# Patient Record
Sex: Female | Born: 1980 | Race: White | Hispanic: No | Marital: Single | Smoking: Former smoker
Health system: Southern US, Community
[De-identification: ages and names within clinical notes are randomized; demographics above are authoritative.]

## PROBLEM LIST (undated history)

## (undated) ENCOUNTER — Inpatient Hospital Stay (HOSPITAL_COMMUNITY): Payer: Self-pay

## (undated) DIAGNOSIS — F32A Depression, unspecified: Secondary | ICD-10-CM

## (undated) DIAGNOSIS — F419 Anxiety disorder, unspecified: Secondary | ICD-10-CM

## (undated) DIAGNOSIS — R7303 Prediabetes: Secondary | ICD-10-CM

## (undated) DIAGNOSIS — Z789 Other specified health status: Secondary | ICD-10-CM

## (undated) HISTORY — PX: WISDOM TOOTH EXTRACTION: SHX21

---

## 2004-05-14 ENCOUNTER — Emergency Department (HOSPITAL_COMMUNITY): Admission: EM | Admit: 2004-05-14 | Discharge: 2004-05-14 | Payer: Self-pay | Admitting: Emergency Medicine

## 2005-12-19 ENCOUNTER — Ambulatory Visit (HOSPITAL_COMMUNITY): Admission: RE | Admit: 2005-12-19 | Discharge: 2005-12-19 | Payer: Self-pay | Admitting: Gynecology

## 2006-01-02 ENCOUNTER — Ambulatory Visit (HOSPITAL_COMMUNITY): Admission: RE | Admit: 2006-01-02 | Discharge: 2006-01-02 | Payer: Self-pay | Admitting: Obstetrics & Gynecology

## 2006-01-24 ENCOUNTER — Ambulatory Visit: Payer: Self-pay | Admitting: Obstetrics & Gynecology

## 2006-02-20 ENCOUNTER — Inpatient Hospital Stay (HOSPITAL_COMMUNITY): Admission: AD | Admit: 2006-02-20 | Discharge: 2006-02-20 | Payer: Self-pay | Admitting: *Deleted

## 2006-02-20 ENCOUNTER — Ambulatory Visit: Payer: Self-pay | Admitting: *Deleted

## 2006-05-18 ENCOUNTER — Inpatient Hospital Stay (HOSPITAL_COMMUNITY): Admission: AD | Admit: 2006-05-18 | Discharge: 2006-05-20 | Payer: Self-pay | Admitting: Obstetrics & Gynecology

## 2006-05-18 ENCOUNTER — Inpatient Hospital Stay (HOSPITAL_COMMUNITY): Admission: AD | Admit: 2006-05-18 | Discharge: 2006-05-18 | Payer: Self-pay | Admitting: Obstetrics & Gynecology

## 2006-05-18 ENCOUNTER — Ambulatory Visit: Payer: Self-pay | Admitting: *Deleted

## 2006-05-24 ENCOUNTER — Inpatient Hospital Stay (HOSPITAL_COMMUNITY): Admission: AD | Admit: 2006-05-24 | Discharge: 2006-05-24 | Payer: Self-pay | Admitting: Obstetrics & Gynecology

## 2006-08-30 ENCOUNTER — Emergency Department (HOSPITAL_COMMUNITY): Admission: EM | Admit: 2006-08-30 | Discharge: 2006-08-30 | Payer: Self-pay | Admitting: Emergency Medicine

## 2008-08-30 ENCOUNTER — Emergency Department (HOSPITAL_COMMUNITY): Admission: EM | Admit: 2008-08-30 | Discharge: 2008-08-30 | Payer: Self-pay | Admitting: Emergency Medicine

## 2010-04-07 ENCOUNTER — Emergency Department (HOSPITAL_COMMUNITY)
Admission: EM | Admit: 2010-04-07 | Discharge: 2010-04-07 | Payer: Self-pay | Source: Home / Self Care | Admitting: Emergency Medicine

## 2010-04-07 LAB — URINALYSIS, ROUTINE W REFLEX MICROSCOPIC
Bilirubin Urine: NEGATIVE
Hemoglobin, Urine: NEGATIVE
Ketones, ur: NEGATIVE mg/dL
Nitrite: NEGATIVE
Protein, ur: NEGATIVE mg/dL
Specific Gravity, Urine: 1.022 (ref 1.005–1.030)
Urine Glucose, Fasting: NEGATIVE mg/dL
Urobilinogen, UA: 0.2 mg/dL (ref 0.0–1.0)
pH: 7 (ref 5.0–8.0)

## 2010-04-07 LAB — WET PREP, GENITAL
Trich, Wet Prep: NONE SEEN
Yeast Wet Prep HPF POC: NONE SEEN

## 2010-04-07 LAB — PREGNANCY, URINE: Preg Test, Ur: NEGATIVE

## 2010-04-08 LAB — GC/CHLAMYDIA PROBE AMP, GENITAL
Chlamydia, DNA Probe: NEGATIVE
GC Probe Amp, Genital: NEGATIVE

## 2010-05-23 ENCOUNTER — Encounter: Payer: Self-pay | Admitting: Physician Assistant

## 2010-05-26 ENCOUNTER — Other Ambulatory Visit: Payer: Self-pay | Admitting: Physician Assistant

## 2010-05-26 ENCOUNTER — Encounter (INDEPENDENT_AMBULATORY_CARE_PROVIDER_SITE_OTHER): Payer: Self-pay | Admitting: Physician Assistant

## 2010-05-26 DIAGNOSIS — Z09 Encounter for follow-up examination after completed treatment for conditions other than malignant neoplasm: Secondary | ICD-10-CM

## 2010-05-26 DIAGNOSIS — N83209 Unspecified ovarian cyst, unspecified side: Secondary | ICD-10-CM

## 2010-06-03 ENCOUNTER — Ambulatory Visit (HOSPITAL_COMMUNITY): Payer: Self-pay | Attending: Physician Assistant

## 2010-06-16 ENCOUNTER — Ambulatory Visit (HOSPITAL_COMMUNITY): Payer: Self-pay

## 2010-06-23 ENCOUNTER — Ambulatory Visit (HOSPITAL_COMMUNITY): Payer: Self-pay | Attending: Physician Assistant

## 2010-06-24 NOTE — Progress Notes (Signed)
NAME:  Veronica Obrien, Veronica Obrien                 ACCOUNT NO.:  [id number]  MEDICAL RECORD NO.:  [id number]           PATIENT TYPE:  A  LOCATION:  [facility]                   FACILITY:  [facility]  PHYSICIAN:  Maylon Cos, CNM    DATE OF BIRTH:  1980-10-02  DATE OF SERVICE:  05/26/2010                                 CLINIC NOTE  REASON FOR TODAY'S VISIT:  Followup from [facility] Room with diagnosis of right ovarian cyst.  HISTORY OF PRESENT ILLNESS:  The patient is a 30 year old gravida 1, para 1-0-0-1, who presented to [facility] ED on April 07, 2010, with diffuse abdominal pain x1 week associated with nausea, vomiting, and chills.  At the time of her visit, she had a pelvic ultrasound that showed a right ovarian 5.4-cm lesion that likely represented a hemorrhagic cyst or an endometrioma.  The patient was treated with pain medication Percocet at the time of her visit that did relieve her pain. She was also tested for gonorrhea and Chlamydia which resulted both negative and a wet prep that showed moderate Bricco blood cells.  She returns today, stating that 2 days after her visit, her pain completely resolved.  She has not had to take any more of Percocet since that visit and she has no complaints.  PHYSICAL EXAMINATION:  GENERAL:  Veronica Obrien is a pleasant Caucasian female who appears to be her stated age of 14.  She is in no apparent distress. HEENT:  Grossly normal. VITAL SIGNS:  Her vital signs are stable.  Pulse is 70.  Her blood pressure is 115/78.  Her weight is 186.9.  Her height is 67 inches.  Her exam today is problem focused. ABDOMEN:  Soft and nontender with no masses or hepatosplenomegaly. GU:  Bimanual exam reveals nonenlarged, nontender uterus.  Right adnexa is not enlarged.  The patient does complain of mild tenderness to palpation.  Her left adnexa is not enlarged and nontender.  Mirena IUD strings are palpable during the exam.  ASSESSMENT: 1. Right ovarian cyst, likely  resolved. 2. Undesired fertility with Mirena intrauterine device.  PLAN:  Today, we will schedule for the patient to follow up for a pelvic ultrasound approximately 8 weeks after her original ultrasound to definitively diagnose resolution of her cyst. 1. The patient should follow up in 2-3 weeks after her pelvic     ultrasound to discuss the results of her ultrasound and also to     have a full annual physical with Pap smear.  I have discussed this     plan with the patient.  She is in agreement with that.          ______________________________ Maylon Cos, CNM   SS/MEDQ  D:  05/26/2010  T:  05/27/2010  Job:  [id number]

## 2010-09-27 ENCOUNTER — Emergency Department (HOSPITAL_COMMUNITY): Payer: Self-pay

## 2010-09-27 ENCOUNTER — Inpatient Hospital Stay (HOSPITAL_COMMUNITY)
Admission: AD | Admit: 2010-09-27 | Discharge: 2010-09-27 | Disposition: A | Discharge status: Home / Self Care | Payer: Self-pay | Source: Ambulatory Visit | Attending: Obstetrics and Gynecology | Admitting: Obstetrics and Gynecology

## 2010-09-27 ENCOUNTER — Emergency Department (HOSPITAL_COMMUNITY)
Admission: EM | Admit: 2010-09-27 | Discharge: 2010-09-27 | Disposition: A | Discharge status: Home / Self Care | Payer: Self-pay | Attending: Emergency Medicine | Admitting: Emergency Medicine

## 2010-09-27 DIAGNOSIS — R5381 Other malaise: Secondary | ICD-10-CM

## 2010-09-27 DIAGNOSIS — R079 Chest pain, unspecified: Secondary | ICD-10-CM | POA: Insufficient documentation

## 2010-09-27 DIAGNOSIS — R0602 Shortness of breath: Secondary | ICD-10-CM | POA: Insufficient documentation

## 2010-09-27 DIAGNOSIS — M79609 Pain in unspecified limb: Secondary | ICD-10-CM | POA: Insufficient documentation

## 2010-09-27 DIAGNOSIS — R5383 Other fatigue: Secondary | ICD-10-CM

## 2010-09-27 DIAGNOSIS — R10819 Abdominal tenderness, unspecified site: Secondary | ICD-10-CM | POA: Insufficient documentation

## 2010-09-27 DIAGNOSIS — R072 Precordial pain: Secondary | ICD-10-CM | POA: Insufficient documentation

## 2010-09-27 DIAGNOSIS — F17200 Nicotine dependence, unspecified, uncomplicated: Secondary | ICD-10-CM | POA: Insufficient documentation

## 2010-09-27 DIAGNOSIS — M542 Cervicalgia: Secondary | ICD-10-CM | POA: Insufficient documentation

## 2010-09-27 LAB — CK TOTAL AND CKMB (NOT AT ARMC)
CK, MB: 2.3 ng/mL (ref 0.3–4.0)
Relative Index: 2 (ref 0.0–2.5)
Total CK: 115 U/L (ref 7–177)

## 2010-09-27 LAB — CBC
HCT: 39.9 % (ref 36.0–46.0)
Hemoglobin: 13.7 g/dL (ref 12.0–15.0)
MCH: 29.8 pg (ref 26.0–34.0)
MCHC: 34.3 g/dL (ref 30.0–36.0)
MCV: 86.9 fL (ref 78.0–100.0)
Platelets: 313 10*3/uL (ref 150–400)
RBC: 4.59 MIL/uL (ref 3.87–5.11)
RDW: 12.8 % (ref 11.5–15.5)
WBC: 11.6 10*3/uL — ABNORMAL HIGH (ref 4.0–10.5)

## 2010-09-27 LAB — URINALYSIS, ROUTINE W REFLEX MICROSCOPIC
Bilirubin Urine: NEGATIVE
Glucose, UA: NEGATIVE mg/dL
Ketones, ur: NEGATIVE mg/dL
Leukocytes, UA: NEGATIVE
Nitrite: NEGATIVE
Protein, ur: NEGATIVE mg/dL
Specific Gravity, Urine: 1.015 (ref 1.005–1.030)
Urobilinogen, UA: 0.2 mg/dL (ref 0.0–1.0)
pH: 6.5 (ref 5.0–8.0)

## 2010-09-27 LAB — DIFFERENTIAL
Basophils Absolute: 0 10*3/uL (ref 0.0–0.1)
Basophils Relative: 0 % (ref 0–1)
Eosinophils Absolute: 0.2 10*3/uL (ref 0.0–0.7)
Eosinophils Relative: 2 % (ref 0–5)
Lymphocytes Relative: 36 % (ref 12–46)
Lymphs Abs: 4.1 10*3/uL — ABNORMAL HIGH (ref 0.7–4.0)
Monocytes Absolute: 1 10*3/uL (ref 0.1–1.0)
Monocytes Relative: 8 % (ref 3–12)
Neutro Abs: 6.2 10*3/uL (ref 1.7–7.7)
Neutrophils Relative %: 54 % (ref 43–77)

## 2010-09-27 LAB — COMPREHENSIVE METABOLIC PANEL
ALT: 29 U/L (ref 0–35)
AST: 20 U/L (ref 0–37)
Albumin: 3.8 g/dL (ref 3.5–5.2)
Alkaline Phosphatase: 76 U/L (ref 39–117)
BUN: 12 mg/dL (ref 6–23)
CO2: 28 mEq/L (ref 19–32)
Calcium: 8.7 mg/dL (ref 8.4–10.5)
Chloride: 103 mEq/L (ref 96–112)
Creatinine, Ser: 0.7 mg/dL (ref 0.50–1.10)
GFR calc Af Amer: >60 mL/min (ref >60)
GFR calc non Af Amer: >60 mL/min (ref >60)
Glucose, Bld: 105 mg/dL — ABNORMAL HIGH (ref 70–99)
Potassium: 3.9 mEq/L (ref 3.5–5.1)
Sodium: 138 mEq/L (ref 135–145)
Total Bilirubin: 0.2 mg/dL — ABNORMAL LOW (ref 0.3–1.2)
Total Protein: 6.8 g/dL (ref 6.0–8.3)

## 2010-09-27 LAB — D-DIMER, QUANTITATIVE: D-Dimer, Quant: 0.23 ug/mL-FEU (ref 0.00–0.48)

## 2010-09-27 LAB — LIPASE, BLOOD: Lipase: 32 U/L (ref 11–59)

## 2010-09-27 LAB — POCT PREGNANCY, URINE: Preg Test, Ur: NEGATIVE

## 2010-09-27 LAB — URINE MICROSCOPIC-ADD ON

## 2010-09-27 LAB — TROPONIN I: Troponin I: <0.3 ng/mL (ref <0.30)

## 2010-10-25 ENCOUNTER — Emergency Department (HOSPITAL_COMMUNITY)
Admission: EM | Admit: 2010-10-25 | Discharge: 2010-10-25 | Disposition: A | Discharge status: Home / Self Care | Payer: Self-pay | Attending: Emergency Medicine | Admitting: Emergency Medicine

## 2010-10-25 DIAGNOSIS — X500XXA Overexertion from strenuous movement or load, initial encounter: Secondary | ICD-10-CM | POA: Insufficient documentation

## 2010-10-25 DIAGNOSIS — IMO0002 Reserved for concepts with insufficient information to code with codable children: Secondary | ICD-10-CM | POA: Insufficient documentation

## 2010-10-25 DIAGNOSIS — M542 Cervicalgia: Secondary | ICD-10-CM | POA: Insufficient documentation

## 2010-10-25 DIAGNOSIS — M25519 Pain in unspecified shoulder: Secondary | ICD-10-CM | POA: Insufficient documentation

## 2011-11-09 ENCOUNTER — Emergency Department (HOSPITAL_COMMUNITY): Payer: Self-pay

## 2011-11-09 ENCOUNTER — Encounter (HOSPITAL_COMMUNITY): Payer: Self-pay | Admitting: Emergency Medicine

## 2011-11-09 ENCOUNTER — Emergency Department (HOSPITAL_COMMUNITY)
Admission: EM | Admit: 2011-11-09 | Discharge: 2011-11-09 | Disposition: A | Discharge status: Home / Self Care | Payer: Self-pay | Attending: Emergency Medicine | Admitting: Emergency Medicine

## 2011-11-09 DIAGNOSIS — F17200 Nicotine dependence, unspecified, uncomplicated: Secondary | ICD-10-CM | POA: Insufficient documentation

## 2011-11-09 DIAGNOSIS — S93409A Sprain of unspecified ligament of unspecified ankle, initial encounter: Secondary | ICD-10-CM | POA: Insufficient documentation

## 2011-11-09 DIAGNOSIS — X500XXA Overexertion from strenuous movement or load, initial encounter: Secondary | ICD-10-CM | POA: Insufficient documentation

## 2011-11-09 DIAGNOSIS — Y998 Other external cause status: Secondary | ICD-10-CM | POA: Insufficient documentation

## 2011-11-09 DIAGNOSIS — Y9389 Activity, other specified: Secondary | ICD-10-CM | POA: Insufficient documentation

## 2011-11-09 MED ORDER — HYDROCODONE-ACETAMINOPHEN 5-325 MG PO TABS: 2.0000 | ORAL_TABLET | ORAL | Status: AC | PRN

## 2011-11-09 MED ORDER — OXYCODONE-ACETAMINOPHEN 5-325 MG PO TABS: 2.0000 | ORAL_TABLET | Freq: Once | ORAL | Status: DC

## 2011-11-09 MED ORDER — OXYCODONE-ACETAMINOPHEN 5-325 MG PO TABS
2.0000 | ORAL_TABLET | Freq: Once | ORAL | Status: AC
  Administered 2011-11-09: 2 via ORAL
  Filled 2011-11-09: qty 2

## 2011-11-09 NOTE — Progress Notes (Signed)
Pt listed as self pay with no insurance coverage Pt confirms he is [place] resident.  CM and [facility] coordinator spoke with him

## 2011-11-09 NOTE — ED Provider Notes (Signed)
History     CSN: [id number]  Arrival date & time 11/09/11  1108   First MD Initiated Contact with Patient 11/09/11 1108      Chief Complaint  Patient presents with  . Ankle Pain    (Consider location/radiation/quality/duration/timing/severity/associated sxs/prior treatment) HPI ANGELI Obrien is a 31 y.o. female presents to the emergency department complaining of ankle pain.  The onset of the symptoms was  abrupt starting 12 hours ago when she slipped off the curb and twisted her ankle.  She thought it was just sprained the bed overnight and woke up with it worse this morning.  The patient has associated tingling through the ankle to the toes.  The symptoms have been  persistent, gradually worsened overnight.  Walking, movement makes the symptoms worse and nothing makes symptoms better.  She has not tried to take anything.  The patient denies numbness. The ankle is bruised, swollen, tender and painful.   Pain is rated at a 6 /10, it is described as achy, without radiation.  She complains of limited range of motion and inability to bear weight.  The patient has medical history significant for: History reviewed. No pertinent past medical history.   History reviewed. No pertinent past medical history.  Past Surgical History  Procedure Date  . Wisdom tooth extraction     Family History  Problem Relation Age of Onset  . Diabetes Other     History  Substance Use Topics  . Smoking status: Current Everyday Smoker -- 0.5 packs/day  . Smokeless tobacco: Not on file  . Alcohol Use: Yes    OB History    Grav Para Term Preterm Abortions TAB SAB Ect Mult Living                  Review of Systems  Musculoskeletal: Positive for joint swelling.       Ankle pain  Skin: Positive for color change. Negative for wound.  Neurological: Negative for numbness.    Allergies  Review of patient's allergies indicates no known allergies.  Home Medications   Current Outpatient Rx  Name Route  Sig Dispense Refill  . GOODY HEADACHE PO Oral Take 1 packet by mouth as needed. For menstrual cramps    . IBUPROFEN 200 MG PO TABS Oral Take 200 mg by mouth every 6 (six) hours as needed. For menstrual cramps    . ADULT MULTIVITAMIN W/MINERALS CH Oral Take 1 tablet by mouth daily. One a day vitamin for women under fifty metabolism boost    . HYDROCODONE-ACETAMINOPHEN 5-325 MG PO TABS Oral Take 2 tablets by mouth every 4 (four) hours as needed for pain. 15 tablet 0    BP 98/50  Pulse 81  Temp 98.7 F (37.1 C) (Oral)  Resp 16  Ht 5\' 7"  (1.702 m)  Wt 180 lb (81.647 kg)  BMI 28.19 kg/m2  SpO2 98%  LMP 11/09/2011  Physical Exam  Nursing note and vitals reviewed. Constitutional: She appears well-developed and well-nourished. No distress.  HENT:  Head: Normocephalic and atraumatic.  Eyes: Conjunctivae are normal.  Cardiovascular: Normal rate, regular rhythm, normal heart sounds and intact distal pulses.  Exam reveals no gallop and no friction rub.   No murmur heard.      Capillary refill less than 3 seconds  Pulmonary/Chest: Effort normal and breath sounds normal. No respiratory distress. She has no wheezes.  Musculoskeletal: She exhibits tenderness. She exhibits no edema.       ROM: Decreased secondary to pain  Mild swelling of the lateral ankle noted.  Neurological: She is alert. Coordination normal.       Sensation intact to normal, sharp and dull touch. Strength present decreased slightly secondary to pain.  Skin: Skin is warm and dry. No rash noted. She is not diaphoretic.    ED Course  Procedures (including critical care time)  Labs Reviewed - No data to display Dg Ankle Complete Right  11/09/2011  *RADIOLOGY REPORT*  Clinical Data: Fall, lateral pain.  RIGHT ANKLE - COMPLETE 3+ VIEW  Comparison: 08/30/2006  Findings: A small old avulsed fragment off the lateral malleolus. Lateral soft tissue swelling.  No acute fracture, subluxation or dislocation.  Joint spaces are  maintained.  IMPRESSION: No acute bony abnormality.  Original Report Authenticated By: Cyndie Chime, M.D.    Dg Ankle Complete Right  11/09/2011  *RADIOLOGY REPORT*  Clinical Data: Fall, lateral pain.  RIGHT ANKLE - COMPLETE 3+ VIEW  Comparison: 08/30/2006  Findings: A small old avulsed fragment off the lateral malleolus. Lateral soft tissue swelling.  No acute fracture, subluxation or dislocation.  Joint spaces are maintained.  IMPRESSION: No acute bony abnormality.  Original Report Authenticated By: Cyndie Chime, M.D.    1. Ankle sprain       MDM  Rutherford Guys presents with right ankle pain after injury.  Suspect ankle sprain versus fracture.  Will obtain x-ray to evaluate.  Pain medication here ER.  X-ray of the right ankle shows no acute bony abnormality.  I will treat this as an ankle sprain with support and crutches.  Patient followup with orthopedics if no improvement in 5 days.  1. Medications: norco 2. Treatment: rest, ice, elevation, compression 3. Follow Up: with orthopedics as needed if not any better in 5 days.           Veronica Client Jayce Kainz, PA-C 11/09/11 1322

## 2011-11-09 NOTE — ED Notes (Signed)
Patient states she turned her right ankle [place] the bus stop and the pain and swelling have been getting worse.

## 2011-11-10 NOTE — ED Provider Notes (Signed)
Medical screening examination/treatment/procedure(s) were performed by non-physician practitioner and as supervising physician I was immediately available for consultation/collaboration.   Lyanne Co, MD 11/10/11 [phone]

## 2011-12-09 ENCOUNTER — Encounter (HOSPITAL_COMMUNITY): Payer: Self-pay | Admitting: Emergency Medicine

## 2011-12-09 ENCOUNTER — Emergency Department (HOSPITAL_COMMUNITY)
Admission: EM | Admit: 2011-12-09 | Discharge: 2011-12-09 | Disposition: A | Discharge status: Home / Self Care | Payer: Self-pay | Attending: Emergency Medicine | Admitting: Emergency Medicine

## 2011-12-09 DIAGNOSIS — E119 Type 2 diabetes mellitus without complications: Secondary | ICD-10-CM | POA: Insufficient documentation

## 2011-12-09 DIAGNOSIS — K047 Periapical abscess without sinus: Secondary | ICD-10-CM | POA: Insufficient documentation

## 2011-12-09 DIAGNOSIS — F17200 Nicotine dependence, unspecified, uncomplicated: Secondary | ICD-10-CM | POA: Insufficient documentation

## 2011-12-09 DIAGNOSIS — Z888 Allergy status to other drugs, medicaments and biological substances status: Secondary | ICD-10-CM | POA: Insufficient documentation

## 2011-12-09 MED ORDER — IBUPROFEN 800 MG PO TABS: 800.0000 mg | ORAL_TABLET | Freq: Three times a day (TID) | ORAL | Status: AC | PRN

## 2011-12-09 MED ORDER — HYDROCODONE-ACETAMINOPHEN 5-325 MG PO TABS: 1.0000 | ORAL_TABLET | ORAL | Status: AC | PRN

## 2011-12-09 MED ORDER — HYDROCODONE-ACETAMINOPHEN 5-325 MG PO TABS
2.0000 | ORAL_TABLET | Freq: Once | ORAL | Status: AC
  Administered 2011-12-09: 2 via ORAL
  Filled 2011-12-09: qty 2

## 2011-12-09 MED ORDER — PENICILLIN V POTASSIUM 500 MG PO TABS: 500.0000 mg | ORAL_TABLET | Freq: Four times a day (QID) | ORAL | Status: AC

## 2011-12-09 NOTE — ED Notes (Signed)
Pt to window to inform staff that pain is still present.

## 2011-12-09 NOTE — ED Notes (Signed)
Pt presents w/ dental pain,  Bottom left molar is absent. Pt states tooth broke around a year ago and the filling came out. Tooth has not bothered pt until yesterday when it began hurting. Has Rx'ed w/ ice. Ibuprofen

## 2011-12-09 NOTE — ED Provider Notes (Signed)
History     CSN: [id number]  Arrival date & time 12/09/11  1240   First MD Initiated Contact with Patient 12/09/11 1430      Chief Complaint  Patient presents with  . Dental Pain    (Consider location/radiation/quality/duration/timing/severity/associated sxs/prior treatment) HPI Comments: Patient reports her left lower molar broke approximately 1 year ago, states she bit down on something and her tooth broke in half - states she then pulled the other half out.  No pain involving this tooth until yesterday when she developed throbbing pain in this area, and today noticed swelling of her face. Pain is 10/10.  Has taken ibuprofen (600mg ) without improvement.  Denies fevers, sore throat, difficulty swallowing or breathing.    Patient is a 31 y.o. female presenting with tooth pain. The history is provided by the patient.  Dental PainPrimary symptoms do not include fever or sore throat.  Additional symptoms do not include: trouble swallowing.    History reviewed. No pertinent past medical history.  Past Surgical History  Procedure Date  . Wisdom tooth extraction     Family History  Problem Relation Age of Onset  . Diabetes Other     History  Substance Use Topics  . Smoking status: Current Everyday Smoker -- 0.2 packs/day  . Smokeless tobacco: Never Used  . Alcohol Use: Yes    OB History    Grav Para Term Preterm Abortions TAB SAB Ect Mult Living                  Review of Systems  Constitutional: Negative for fever and chills.  HENT: Positive for dental problem. Negative for sore throat and trouble swallowing.     Allergies  Novocain  Home Medications   Current Outpatient Rx  Name Route Sig Dispense Refill  . IBUPROFEN 200 MG PO TABS Oral Take 200-600 mg by mouth every 6 (six) hours as needed. For pain    . HYDROCODONE-ACETAMINOPHEN 5-325 MG PO TABS Oral Take 1-2 tablets by mouth every 4 (four) hours as needed for pain. 20 tablet 0  . IBUPROFEN 800 MG PO TABS  Oral Take 1 tablet (800 mg total) by mouth every 8 (eight) hours as needed for pain or fever. 21 tablet 0  . PENICILLIN V POTASSIUM 500 MG PO TABS Oral Take 1 tablet (500 mg total) by mouth 4 (four) times daily. 40 tablet 0    BP 130/85  Pulse 62  Temp 98.5 F (36.9 C) (Oral)  Resp 20  SpO2 99%  LMP 12/05/2011  Physical Exam  Nursing note and vitals reviewed. Constitutional: She appears well-developed and well-nourished. No distress.  HENT:  Head: Normocephalic and atraumatic.  Mouth/Throat: Uvula is midline and oropharynx is clear and moist. Mucous membranes are not dry. Dental abscesses present. No uvula swelling. No oropharyngeal exudate, posterior oropharyngeal edema or tonsillar abscesses.    Neck: Phonation normal. Neck supple. No tracheal tenderness present. No tracheal deviation present.  Pulmonary/Chest: Effort normal.  Lymphadenopathy:    She has cervical adenopathy.  Neurological: She is alert.  Skin: She is not diaphoretic.    ED Course  Procedures (including critical care time)  Labs Reviewed - No data to display No results found.   1. Dental abscess       MDM  Afebrile nontoxic patient with left lower molar abscess.  No sore throat or neck pain, no paratracheal tenderness, no voice change.  Doubt deeper infection, doubt ludwig's angina.  Pt d/c home with dental follow  up, penicillin, Vicodin, and ibuprofen.  Discussed diagnosis and care plan with patient.  Pt given return precautions.  Pt verbalizes understanding and agrees with plan.           [place], [place] 12/09/11 [phone]

## 2011-12-09 NOTE — ED Provider Notes (Signed)
Medical screening examination/treatment/procedure(s) were performed by non-physician practitioner and as supervising physician I was immediately available for consultation/collaboration.   Keron Neenan M Alicja Everitt, MD 12/09/11 2052 

## 2012-08-13 ENCOUNTER — Emergency Department (HOSPITAL_COMMUNITY)
Admission: EM | Admit: 2012-08-13 | Discharge: 2012-08-13 | Disposition: A | Discharge status: Home / Self Care | Payer: Self-pay | Attending: Emergency Medicine | Admitting: Emergency Medicine

## 2012-08-13 ENCOUNTER — Encounter (HOSPITAL_COMMUNITY): Payer: Self-pay | Admitting: Emergency Medicine

## 2012-08-13 DIAGNOSIS — X503XXA Overexertion from repetitive movements, initial encounter: Secondary | ICD-10-CM | POA: Insufficient documentation

## 2012-08-13 DIAGNOSIS — X500XXA Overexertion from strenuous movement or load, initial encounter: Secondary | ICD-10-CM | POA: Insufficient documentation

## 2012-08-13 DIAGNOSIS — S43499A Other sprain of unspecified shoulder joint, initial encounter: Secondary | ICD-10-CM | POA: Insufficient documentation

## 2012-08-13 DIAGNOSIS — Y9229 Other specified public building as the place of occurrence of the external cause: Secondary | ICD-10-CM | POA: Insufficient documentation

## 2012-08-13 DIAGNOSIS — S46911A Strain of unspecified muscle, fascia and tendon at shoulder and upper arm level, right arm, initial encounter: Secondary | ICD-10-CM

## 2012-08-13 DIAGNOSIS — M62838 Other muscle spasm: Secondary | ICD-10-CM | POA: Insufficient documentation

## 2012-08-13 DIAGNOSIS — Y990 Civilian activity done for income or pay: Secondary | ICD-10-CM | POA: Insufficient documentation

## 2012-08-13 DIAGNOSIS — F17200 Nicotine dependence, unspecified, uncomplicated: Secondary | ICD-10-CM | POA: Insufficient documentation

## 2012-08-13 NOTE — ED Provider Notes (Signed)
History    This chart was scribed for non-physician practitioner Johnnette Gourd working with Celene Kras, MD by Quintella Reichert, ED Scribe. This patient was seen in room WTR7/WTR7 and the patient's care was started at 3:13 PM .   CSN: [id number]  Arrival date & time 08/13/12  1335         The history is provided by the patient. No language interpreter was used.    HPI Comments: Veronica Obrien is a 32 y.o. female who presents to the Emergency Department complaining of right shoulder pain that began today  She describes pain as one area of tightness between right shoulder and neck, and another area of sharp pain slightly further down the back.  She also notes a "knot" in the area.  Pt has h/o right shoulder pain and states she has received a shot for the pain in the past, which relieved symptoms. 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  Does not feel as bad as her initial shoulder injury and does not need anything for it at this time, just a work note. Pt denies weakness, numbness, abdominal pain, nausea, emesis, urinary symptoms or any other associated symptoms.   History reviewed. No pertinent past medical history.  Past Surgical History  Procedure Laterality Date  . Wisdom tooth extraction      Family History  Problem Relation Age of Onset  . Diabetes Other     History  Substance Use Topics  . Smoking status: Current Every Day Smoker -- 0.25 packs/day  . Smokeless tobacco: Never Used  . Alcohol Use: Yes    OB History   Grav Para Term Preterm Abortions TAB SAB Ect Mult Living                  Review of Systems  Musculoskeletal:       Pain near right shoulder  All other systems reviewed and are negative.    Allergies  Novocain  Home Medications   Current Outpatient Rx  Name  Route  Sig  Dispense  Refill  . ibuprofen (ADVIL,MOTRIN) 200 MG tablet   Oral   Take 200-600 mg by mouth every 6  (six) hours as needed. For pain         . loratadine (CLARITIN) 10 MG tablet   Oral   Take 10 mg by mouth daily.           BP 130/72  Pulse 72  Temp(Src) 98.6 F (37 C)  Resp 16  SpO2 100%  LMP 07/19/2012  Physical Exam  Nursing note and vitals reviewed. Constitutional: She is oriented to person, place, and time. She appears well-developed and well-nourished. No distress.  HENT:  Head: Normocephalic and atraumatic.  Eyes: Conjunctivae and EOM are normal. Pupils are equal, round, and reactive to light.  Neck: Normal range of motion and full passive range of motion without pain. Neck supple. No spinous process tenderness and no muscular tenderness present. No tracheal deviation present.  Cardiovascular: Normal rate.   Pulmonary/Chest: Effort normal. No respiratory distress.  Musculoskeletal: Normal range of motion.  Full ROm of right shoulder Tenderness to palpation of right trapezius, with muscle spasm. No bony tenderness.  Neurological: She is alert and oriented to person, place, and time.  Sensation intact  Skin: Skin is warm and dry.  Psychiatric: She has a normal mood and affect. Her behavior is normal.  ED Course  Procedures (including critical care time)  DIAGNOSTIC STUDIES: Oxygen Saturation is 100% on room air, normal by my interpretation.    COORDINATION OF CARE: 3:16 PM-Discussed treatment plan which includes pain medication and applying heat with pt at bedside and pt agreed to plan.      Labs Reviewed - No data to display No results found.   1. Shoulder strain, right, initial encounter   2. Muscle spasm       MDM  Shoulder pain/strain, muscle spasm. Full ROM. Sensation intact. No intervention needed at this time. Note given for work. Advised rest, heat, ibuprofen. Patient states understanding of plan and is agreeable.      I personally performed the services described in this documentation, which was scribed in my presence. The recorded  information has been reviewed and is accurate.    Trevor Mace, PA-C 08/13/12 4098

## 2012-08-13 NOTE — ED Provider Notes (Signed)
Medical screening examination/treatment/procedure(s) were performed by non-physician practitioner and as supervising physician I was immediately available for consultation/collaboration.    Sylvester Salonga R My Rinke, MD 08/13/12 1616 

## 2012-08-13 NOTE — ED Notes (Signed)
Pt reports right shoulder pain that caused her to leave work today. Pt says "I have a not in my right shoulder and it hurts to move my arm." Denies numbness. Pulses present.

## 2012-08-13 NOTE — Progress Notes (Signed)
[facility] ED CM consulted by [facility] [employer] [facility] liaison about a "supplemental indemnity" plan from her job ([employer])  Cm spoke with the pt who states she does not have her card at this time but will return and provide registration with the card when she receives it Pt reports this coverage offers assist with her medications, hospital and dr visits.  CM encouraged her to return with coverage card for processing

## 2012-08-13 NOTE — Progress Notes (Signed)
[facility] CL seen patient and gave an OC application. Patient stated that she was waiting to receive a [facility] Indemnity from her job.

## 2012-08-13 NOTE — Progress Notes (Signed)
Pt also reports she has disability less than $150 Cm also encouraged pt to sign up for the affordable care act program beginning in October 2014 Pt states she will complete process

## 2013-01-14 ENCOUNTER — Emergency Department (HOSPITAL_COMMUNITY)
Admission: EM | Admit: 2013-01-14 | Discharge: 2013-01-14 | Disposition: A | Discharge status: Home / Self Care | Payer: No Typology Code available for payment source | Attending: Emergency Medicine | Admitting: Emergency Medicine

## 2013-01-14 ENCOUNTER — Encounter (HOSPITAL_COMMUNITY): Payer: Self-pay | Admitting: Emergency Medicine

## 2013-01-14 ENCOUNTER — Emergency Department (HOSPITAL_COMMUNITY): Payer: No Typology Code available for payment source

## 2013-01-14 DIAGNOSIS — S300XXA Contusion of lower back and pelvis, initial encounter: Secondary | ICD-10-CM

## 2013-01-14 DIAGNOSIS — Y9289 Other specified places as the place of occurrence of the external cause: Secondary | ICD-10-CM | POA: Insufficient documentation

## 2013-01-14 DIAGNOSIS — F1010 Alcohol abuse, uncomplicated: Secondary | ICD-10-CM | POA: Insufficient documentation

## 2013-01-14 DIAGNOSIS — W108XXA Fall (on) (from) other stairs and steps, initial encounter: Secondary | ICD-10-CM | POA: Insufficient documentation

## 2013-01-14 DIAGNOSIS — F17200 Nicotine dependence, unspecified, uncomplicated: Secondary | ICD-10-CM | POA: Insufficient documentation

## 2013-01-14 DIAGNOSIS — Y9389 Activity, other specified: Secondary | ICD-10-CM | POA: Insufficient documentation

## 2013-01-14 MED ORDER — CYCLOBENZAPRINE HCL 5 MG PO TABS: 5.0000 mg | ORAL_TABLET | Freq: Two times a day (BID) | ORAL | Status: DC | PRN

## 2013-01-14 NOTE — ED Provider Notes (Signed)
CSN: [id number]     Arrival date & time 01/14/13  2052 History  This chart was scribed for Veronica Pel, PA-C, working with Veronica Razor, MD, by Veronica Obrien ED Scribe. This patient was seen in room WTR7/WTR7 and the patient's care was started at 10:25 PM.   Chief Complaint  Patient presents with  . Tailbone Pain    The history is provided by the patient. No language interpreter was used.    HPI Comments: Veronica Obrien is a 32 y.o. female who presents to the Emergency Department complaining of constant, moderate tailbone pain onset 6 days ago when pt states that she slipped and fell down 1 step, landing on her buttocks. She states that she was intoxicated at the time and didn't feel the pain until the next day. She states that there is some bruising to the area. She states that her pain is worsened with sitting, standing and walking. She states that her pain is worst at work when she stands for long periods of time, and she states that she is here at the request of her work.. She states that she has taken Ibuprofen without relief. She denies any other pain or symptoms.   History reviewed. No pertinent past medical history. Past Surgical History  Procedure Laterality Date  . Wisdom tooth extraction     Family History  Problem Relation Age of Onset  . Diabetes Other    History  Substance Use Topics  . Smoking status: Current Every Day Smoker -- 0.25 packs/day  . Smokeless tobacco: Never Used  . Alcohol Use: Yes   OB History   Grav Para Term Preterm Abortions TAB SAB Ect Mult Living                 Review of Systems  Musculoskeletal:       Tailbone pain.  All other systems reviewed and are negative.   Allergies  Novocain  Home Medications   Current Outpatient Rx  Name  Route  Sig  Dispense  Refill  . ibuprofen (ADVIL,MOTRIN) 200 MG tablet   Oral   Take 200-600 mg by mouth every 6 (six) hours as needed. For pain          Triage Vitals: BP 123/78  Pulse 74   Temp(Src) 98.3 F (36.8 C) (Oral)  Resp 18  Ht 5\' 7"  (1.702 m)  Wt 199 lb (90.266 kg)  BMI 31.16 kg/m2  SpO2 100%  LMP 01/07/2013  Physical Exam  Nursing note and vitals reviewed. Constitutional: She is oriented to person, place, and time. She appears well-developed and well-nourished. No distress.  HENT:  Head: Normocephalic and atraumatic.  Eyes: EOM are normal.  Neck: Neck supple. No tracheal deviation present.  Cardiovascular: Normal rate.   Pulmonary/Chest: Effort normal. No respiratory distress.  Musculoskeletal: Normal range of motion.       Lumbar back: She exhibits tenderness, bony tenderness and pain. She exhibits normal range of motion, no swelling, no edema, no deformity, no laceration, no spasm and normal pulse.       Back:  Neurological: She is alert and oriented to person, place, and time.  Skin: Skin is warm and dry.  Psychiatric: She has a normal mood and affect. Her behavior is normal.    ED Course  Procedures (including critical care time)  DIAGNOSTIC STUDIES: Oxygen Saturation is 100% on RA, normal by my interpretation.    COORDINATION OF CARE: 10:29 PM- Discussed normal radiology findings with pt. Discussed plan for  pt to receive a prescription for Flexeril to be taken PRN. Will also provide pt with a referral to Ortho. Pt offered stronger pain medication and pt declines. Pt also requesting a note to go back to work and this will be provided. Pt advised of plan for treatment and pt agrees.  Labs Review Labs Reviewed - No data to display Imaging Review Dg Sacrum/coccyx  01/14/2013   CLINICAL DATA:  Larey Seat, coccydynia.  EXAM: SACRUM AND COCCYX - 2+ VIEW  COMPARISON:  None.  FINDINGS: There is no evidence of fracture or other focal bone lesions  IMPRESSION: Negative.   Electronically Signed   By: Davonna Belling M.D.   On: 01/14/2013 21:34   MDM   1. Sacral contusion, initial encounter    31 y.o.Veronica Obrien's  with back pain. No neurological deficits and  normal neuro exam. Patient can walk but states is painful. No loss of bowel or bladder control. No concern for cauda equina. No fever, night sweats, weight loss, h/o cancer, IVDU. RICE protocol and pain medicine indicated and discussed with patient.   Patient Plan 1. Medications: narcotic pain medication, muscle relaxer and usual home medications  2. Treatment: rest, drink plenty of fluids, gentle stretching as discussed, alternate ice and heat  3. Follow Up: Please followup with your primary doctor for discussion of your diagnoses and further evaluation after today's visit; if you do not have a primary care doctor use the resource guide provided to find one   Vital signs are stable at discharge. Filed Vitals:   01/14/13 2108  BP: 123/78  Pulse: 74  Temp: 98.3 F (36.8 C)  Resp: 18    Patient/guardian has voiced understanding and agreed to follow-up with the PCP or specialist.     I personally performed the services described in this documentation, which was scribed in my presence. The recorded information has been reviewed and is accurate.   Dorthula Matas, PA-C 01/14/13 2237

## 2013-01-14 NOTE — ED Notes (Signed)
Pt fell on the stairs about 6 days ago and injured her tailbone

## 2013-01-21 NOTE — ED Provider Notes (Signed)
Medical screening examination/treatment/procedure(s) were performed by non-physician practitioner and as supervising physician I was immediately available for consultation/collaboration.  Levonte Molina, MD 01/21/13 1439 

## 2013-09-11 ENCOUNTER — Other Ambulatory Visit: Payer: Self-pay | Admitting: Physician Assistant

## 2013-09-11 ENCOUNTER — Other Ambulatory Visit (HOSPITAL_COMMUNITY)
Admission: RE | Admit: 2013-09-11 | Discharge: 2013-09-11 | Disposition: A | Discharge status: Home / Self Care | Payer: BC Managed Care – PPO | Source: Ambulatory Visit | Attending: Family Medicine | Admitting: Family Medicine

## 2013-09-11 DIAGNOSIS — Z124 Encounter for screening for malignant neoplasm of cervix: Secondary | ICD-10-CM | POA: Insufficient documentation

## 2013-09-15 LAB — CYTOLOGY - PAP

## 2014-02-23 ENCOUNTER — Encounter (HOSPITAL_COMMUNITY): Payer: Self-pay | Admitting: Emergency Medicine

## 2014-02-23 ENCOUNTER — Emergency Department (HOSPITAL_COMMUNITY)
Admission: EM | Admit: 2014-02-23 | Discharge: 2014-02-23 | Disposition: A | Discharge status: Home / Self Care | Payer: BC Managed Care – PPO | Attending: Emergency Medicine | Admitting: Emergency Medicine

## 2014-02-23 ENCOUNTER — Emergency Department (HOSPITAL_COMMUNITY): Payer: BC Managed Care – PPO

## 2014-02-23 DIAGNOSIS — S24109A Unspecified injury at unspecified level of thoracic spinal cord, initial encounter: Secondary | ICD-10-CM | POA: Diagnosis not present

## 2014-02-23 DIAGNOSIS — Z720 Tobacco use: Secondary | ICD-10-CM | POA: Diagnosis not present

## 2014-02-23 DIAGNOSIS — S20212A Contusion of left front wall of thorax, initial encounter: Secondary | ICD-10-CM | POA: Insufficient documentation

## 2014-02-23 DIAGNOSIS — S335XXA Sprain of ligaments of lumbar spine, initial encounter: Secondary | ICD-10-CM

## 2014-02-23 DIAGNOSIS — S8002XA Contusion of left knee, initial encounter: Secondary | ICD-10-CM | POA: Diagnosis not present

## 2014-02-23 DIAGNOSIS — S0003XA Contusion of scalp, initial encounter: Secondary | ICD-10-CM | POA: Diagnosis not present

## 2014-02-23 DIAGNOSIS — S134XXA Sprain of ligaments of cervical spine, initial encounter: Secondary | ICD-10-CM | POA: Insufficient documentation

## 2014-02-23 DIAGNOSIS — S0990XA Unspecified injury of head, initial encounter: Secondary | ICD-10-CM

## 2014-02-23 DIAGNOSIS — S8011XA Contusion of right lower leg, initial encounter: Secondary | ICD-10-CM | POA: Diagnosis not present

## 2014-02-23 DIAGNOSIS — Y998 Other external cause status: Secondary | ICD-10-CM | POA: Insufficient documentation

## 2014-02-23 DIAGNOSIS — Y9389 Activity, other specified: Secondary | ICD-10-CM | POA: Insufficient documentation

## 2014-02-23 DIAGNOSIS — Y92410 Unspecified street and highway as the place of occurrence of the external cause: Secondary | ICD-10-CM | POA: Insufficient documentation

## 2014-02-23 DIAGNOSIS — S8392XA Sprain of unspecified site of left knee, initial encounter: Secondary | ICD-10-CM | POA: Diagnosis not present

## 2014-02-23 DIAGNOSIS — S139XXA Sprain of joints and ligaments of unspecified parts of neck, initial encounter: Secondary | ICD-10-CM

## 2014-02-23 DIAGNOSIS — S20219A Contusion of unspecified front wall of thorax, initial encounter: Secondary | ICD-10-CM

## 2014-02-23 MED ORDER — OXYCODONE-ACETAMINOPHEN 5-325 MG PO TABS
1.0000 | ORAL_TABLET | Freq: Once | ORAL | Status: AC
  Administered 2014-02-23: 1 via ORAL
  Filled 2014-02-23: qty 1

## 2014-02-23 MED ORDER — HYDROCODONE-ACETAMINOPHEN 5-325 MG PO TABS: 1.0000 | ORAL_TABLET | Freq: Four times a day (QID) | ORAL | Status: DC | PRN

## 2014-02-23 MED ORDER — CYCLOBENZAPRINE HCL 10 MG PO TABS: 10.0000 mg | ORAL_TABLET | Freq: Two times a day (BID) | ORAL | Status: DC | PRN

## 2014-02-23 MED ORDER — HYDROCODONE-ACETAMINOPHEN 5-325 MG PO TABS
1.0000 | ORAL_TABLET | Freq: Once | ORAL | Status: AC
  Administered 2014-02-23: 1 via ORAL
  Filled 2014-02-23: qty 1

## 2014-02-23 MED ORDER — NAPROXEN 500 MG PO TABS: 500.0000 mg | ORAL_TABLET | Freq: Two times a day (BID) | ORAL | Status: DC

## 2014-02-23 NOTE — ED Notes (Signed)
Pt in xray
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
## 2014-04-03 NOTE — L&D Delivery Note (Signed)
Delivery Note At 12:16 PM a viable female was delivered via  (Presentation:vertex).  APGAR: weight: pending.   Infant was direct OP. Placenta status: intact and spontaneous.  Delayed cord clamping x 1 minute, then clamped x 2 and cut. There were no complications.  Anesthesia: Epidural  Episiotomy:  None Lacerations:  none Est. Blood Loss (mL):  300 cc  Mom to postpartum.  Baby to NICU.  Seylah Wernert S 03/30/2015, 12:26 PM

## 2014-06-27 ENCOUNTER — Encounter (HOSPITAL_COMMUNITY): Payer: Self-pay | Admitting: Nurse Practitioner

## 2014-06-27 ENCOUNTER — Emergency Department (HOSPITAL_COMMUNITY): Payer: Self-pay

## 2014-06-27 ENCOUNTER — Emergency Department (HOSPITAL_COMMUNITY)
Admission: EM | Admit: 2014-06-27 | Discharge: 2014-06-28 | Disposition: A | Discharge status: Home / Self Care | Payer: Self-pay | Attending: Emergency Medicine | Admitting: Emergency Medicine

## 2014-06-27 ENCOUNTER — Emergency Department (HOSPITAL_COMMUNITY): Payer: PRIVATE HEALTH INSURANCE

## 2014-06-27 DIAGNOSIS — Z87891 Personal history of nicotine dependence: Secondary | ICD-10-CM | POA: Insufficient documentation

## 2014-06-27 DIAGNOSIS — M6289 Other specified disorders of muscle: Secondary | ICD-10-CM

## 2014-06-27 DIAGNOSIS — Z79899 Other long term (current) drug therapy: Secondary | ICD-10-CM | POA: Insufficient documentation

## 2014-06-27 DIAGNOSIS — G43109 Migraine with aura, not intractable, without status migrainosus: Secondary | ICD-10-CM | POA: Diagnosis present

## 2014-06-27 DIAGNOSIS — G43909 Migraine, unspecified, not intractable, without status migrainosus: Secondary | ICD-10-CM

## 2014-06-27 DIAGNOSIS — M542 Cervicalgia: Secondary | ICD-10-CM | POA: Insufficient documentation

## 2014-06-27 DIAGNOSIS — R531 Weakness: Secondary | ICD-10-CM | POA: Insufficient documentation

## 2014-06-27 DIAGNOSIS — R200 Anesthesia of skin: Secondary | ICD-10-CM | POA: Insufficient documentation

## 2014-06-27 LAB — I-STAT CHEM 8, ED
BUN: 13 mg/dL (ref 6–23)
Calcium, Ion: 1.16 mmol/L (ref 1.12–1.23)
Chloride: 103 mmol/L (ref 96–112)
Creatinine, Ser: 0.9 mg/dL (ref 0.50–1.10)
Glucose, Bld: 110 mg/dL — ABNORMAL HIGH (ref 70–99)
HCT: 43 % (ref 36.0–46.0)
Hemoglobin: 14.6 g/dL (ref 12.0–15.0)
Potassium: 3.5 mmol/L (ref 3.5–5.1)
Sodium: 141 mmol/L (ref 135–145)
TCO2: 23 mmol/L (ref 0–100)

## 2014-06-27 LAB — URINALYSIS, ROUTINE W REFLEX MICROSCOPIC
Bilirubin Urine: NEGATIVE
Glucose, UA: NEGATIVE mg/dL
Hgb urine dipstick: NEGATIVE
Ketones, ur: NEGATIVE mg/dL
Leukocytes, UA: NEGATIVE
Nitrite: NEGATIVE
Protein, ur: NEGATIVE mg/dL
Specific Gravity, Urine: 1.009 (ref 1.005–1.030)
Urobilinogen, UA: 0.2 mg/dL (ref 0.0–1.0)
pH: 7.5 (ref 5.0–8.0)

## 2014-06-27 LAB — RAPID URINE DRUG SCREEN, HOSP PERFORMED
Amphetamines: NOT DETECTED
Barbiturates: NOT DETECTED
Benzodiazepines: NOT DETECTED
Cocaine: NOT DETECTED
Opiates: NOT DETECTED
Tetrahydrocannabinol: NOT DETECTED

## 2014-06-27 LAB — CBG MONITORING, ED: Glucose-Capillary: 94 mg/dL (ref 70–99)

## 2014-06-27 LAB — COMPREHENSIVE METABOLIC PANEL
ALT: 22 U/L (ref 0–35)
AST: 22 U/L (ref 0–37)
Albumin: 3.9 g/dL (ref 3.5–5.2)
Alkaline Phosphatase: 67 U/L (ref 39–117)
Anion gap: 8 (ref 5–15)
BUN: 13 mg/dL (ref 6–23)
CO2: 27 mmol/L (ref 19–32)
Calcium: 9 mg/dL (ref 8.4–10.5)
Chloride: 104 mmol/L (ref 96–112)
Creatinine, Ser: 0.82 mg/dL (ref 0.50–1.10)
GFR calc Af Amer: >90 mL/min (ref >90)
GFR calc non Af Amer: >90 mL/min (ref >90)
Glucose, Bld: 110 mg/dL — ABNORMAL HIGH (ref 70–99)
Potassium: 3.6 mmol/L (ref 3.5–5.1)
Sodium: 139 mmol/L (ref 135–145)
Total Bilirubin: 0.5 mg/dL (ref 0.3–1.2)
Total Protein: 6.9 g/dL (ref 6.0–8.3)

## 2014-06-27 LAB — I-STAT TROPONIN, ED: Troponin i, poc: 0 ng/mL (ref 0.00–0.08)

## 2014-06-27 LAB — DIFFERENTIAL
Basophils Absolute: 0 10*3/uL (ref 0.0–0.1)
Basophils Relative: 0 % (ref 0–1)
Eosinophils Absolute: 0.2 10*3/uL (ref 0.0–0.7)
Eosinophils Relative: 2 % (ref 0–5)
Lymphocytes Relative: 28 % (ref 12–46)
Lymphs Abs: 3.4 10*3/uL (ref 0.7–4.0)
Monocytes Absolute: 0.9 10*3/uL (ref 0.1–1.0)
Monocytes Relative: 7 % (ref 3–12)
Neutro Abs: 7.5 10*3/uL (ref 1.7–7.7)
Neutrophils Relative %: 63 % (ref 43–77)

## 2014-06-27 LAB — CBC
HCT: 41 % (ref 36.0–46.0)
Hemoglobin: 13.6 g/dL (ref 12.0–15.0)
MCH: 29.8 pg (ref 26.0–34.0)
MCHC: 33.2 g/dL (ref 30.0–36.0)
MCV: 89.7 fL (ref 78.0–100.0)
Platelets: 314 10*3/uL (ref 150–400)
RBC: 4.57 MIL/uL (ref 3.87–5.11)
RDW: 12.9 % (ref 11.5–15.5)
WBC: 12 10*3/uL — ABNORMAL HIGH (ref 4.0–10.5)

## 2014-06-27 LAB — ETHANOL: Alcohol, Ethyl (B): <5 mg/dL (ref 0–9)

## 2014-06-27 LAB — PROTIME-INR
INR: 0.99 (ref 0.00–1.49)
Prothrombin Time: 13.2 seconds (ref 11.6–15.2)

## 2014-06-27 LAB — APTT: aPTT: 32 seconds (ref 24–37)

## 2014-06-27 NOTE — Discharge Instructions (Signed)
We saw you in the ER for the headache and weakness. All the results in the ER are normal, labs and imaging. We are not sure what is causing your symptoms. The workup in the ER is not complete, and is limited to screening for life threatening and emergent conditions only, so please see the neurologist as requested.  Paresthesia Paresthesia is an abnormal burning or prickling sensation. This sensation is generally felt in the hands, arms, legs, or feet. However, it may occur in any part of the body. It is usually not painful. The feeling may be described as:  Tingling or numbness.  "Pins and needles."  Skin crawling.  Buzzing.  Limbs "falling asleep."  Itching. Most people experience temporary (transient) paresthesia at some time in their lives. CAUSES  Paresthesia may occur when you breathe too quickly (hyperventilation). It can also occur without any apparent cause. Commonly, paresthesia occurs when pressure is placed on a nerve. The feeling quickly goes away once the pressure is removed. For some people, however, paresthesia is a long-lasting (chronic) condition caused by an underlying disorder. The underlying disorder may be:  A traumatic, direct injury to nerves. Examples include a:  Broken (fractured) neck.  Fractured skull.  A disorder affecting the brain and spinal cord (central nervous system). Examples include:  Transverse myelitis.  Encephalitis.  Transient ischemic attack.  Multiple sclerosis.  Stroke.  Tumor or blood vessel problems, such as an arteriovenous malformation pressing against the brain or spinal cord.  A condition that damages the peripheral nerves (peripheral neuropathy). Peripheral nerves are not part of the brain and spinal cord. These conditions include:  Diabetes.  Peripheral vascular disease.  Nerve entrapment syndromes, such as carpal tunnel syndrome.  Shingles.  Hypothyroidism.  Vitamin B12 deficiencies.  Alcoholism.  Heavy  metal poisoning (lead, arsenic).  Rheumatoid arthritis.  Systemic lupus erythematosus. DIAGNOSIS  Your caregiver will attempt to find the underlying cause of your paresthesia. Your caregiver may:  Take your medical history.  Perform a physical exam.  Order various lab tests.  Order imaging tests. TREATMENT  Treatment for paresthesia depends on the underlying cause. HOME CARE INSTRUCTIONS  Avoid drinking alcohol.  You may consider massage or acupuncture to help relieve your symptoms.  Keep all follow-up appointments as directed by your caregiver. SEEK IMMEDIATE MEDICAL CARE IF:   You feel weak.  You have trouble walking or moving.  You have problems with speech or vision.  You feel confused.  You cannot control your bladder or bowel movements.  You feel numbness after an injury.  You faint.  Your burning or prickling feeling gets worse when walking.  You have pain, cramps, or dizziness.  You develop a rash. MAKE SURE YOU:  Understand these instructions.  Will watch your condition.  Will get help right away if you are not doing well or get worse. Document Released: 03/10/2002 Document Revised: 06/12/2011 Document Reviewed: 12/09/2010 [facility] Patient Information 2015 [place], [place]. This information is not intended to replace advice given to you by your health care provider. Make sure you discuss any questions you have with your health care provider.
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
## 2014-06-27 NOTE — ED Notes (Signed)
Family at bedside. 

## 2014-06-27 NOTE — ED Notes (Signed)
She was at work today at [employer] and had a headache, could see Parkey rings around the lights and then got numbness and tingling in her L arm and lips. She said she could not hold anything or use a key to open a door. She states the symptoms have been intermittent since and different parts of her body feel like they are going numb. She c/o pain in L shoulder also. She is A&Ox4, breathing easily, she has no arm drift or facial droop, speech is clear, grips are = bilateral.

## 2014-06-27 NOTE — Consult Note (Signed)
Admission H&P    Chief Complaint: New onset visual changes and left-sided numbness.  HPI: Veronica Obrien is an 34 y.o. female with no known medical disorder, presenting with new onset visual changes and numbness involving lips and tongue as well as left side of her face and left arm. Onset of symptoms was at 1615 day. She had a headache of mild to moderate severity prior to onset of sensory and visual symptoms. She describes the visual changes as bright lights on the left side of her vision. She had no focal weakness. Speech was unchanged. There was no photophobia and no nausea. CT scan of the head showed no acute intracranial abnormality. Code stroke was activated. Her NIH stroke score was 1 for residual mild sensory changes on the left. She was not deemed a candidate for TPA because of mild residual and anxiety only.  Past medical history: Patient has no known medical disorder.  Past Surgical History  Procedure Laterality Date  . Wisdom tooth extraction      Family History  Problem Relation Age of Onset  . Diabetes Other    Social History:  reports that she quit smoking about 3 weeks ago. She has never used smokeless tobacco. She reports that she drinks alcohol. She reports that she does not use illicit drugs.  Allergies:  Allergies  Allergen Reactions  . Novocain [Procaine Hcl] Other (See Comments)    Makes patient bite tongue     Medications: Patient's preadmission medications were reviewed by me.  ROS: History obtained from the patient  General ROS: negative for - chills, fatigue, fever, night sweats, weight gain or weight loss Psychological ROS: negative for - behavioral disorder, hallucinations, memory difficulties, mood swings or suicidal ideation Ophthalmic ROS: negative for - blurry vision, double vision, eye pain or loss of vision ENT ROS: negative for - epistaxis, nasal discharge, oral lesions, sore throat, tinnitus or vertigo Allergy and Immunology ROS: negative for -  hives or itchy/watery eyes Hematological and Lymphatic ROS: negative for - bleeding problems, bruising or swollen lymph nodes Endocrine ROS: negative for - galactorrhea, hair pattern changes, polydipsia/polyuria or temperature intolerance Respiratory ROS: negative for - cough, hemoptysis, shortness of breath or wheezing Cardiovascular ROS: negative for - chest pain, dyspnea on exertion, edema or irregular heartbeat Gastrointestinal ROS: negative for - abdominal pain, diarrhea, hematemesis, nausea/vomiting or stool incontinence Genito-Urinary ROS: negative for - dysuria, hematuria, incontinence or urinary frequency/urgency Musculoskeletal ROS: negative for - joint swelling or muscular weakness Neurological ROS: as noted in HPI Dermatological ROS: negative for rash and skin lesion changes  Physical Examination: Blood pressure 99/63, pulse 70, temperature 98 F (36.7 C), resp. rate 14, height 5\' 8"  (1.727 m), weight 89.812 kg (198 lb), SpO2 100 %.  HEENT-  Normocephalic, no lesions, without obvious abnormality.  Normal external eye and conjunctiva.  Normal TM's bilaterally.  Normal auditory canals and external ears. Normal external nose, mucus membranes and septum.  Normal pharynx. Neck supple with no masses, nodes, nodules or enlargement. Cardiovascular - regular rate and rhythm, S1, S2 normal, no murmur, click, rub or gallop Lungs - chest clear, no wheezing, rales, normal symmetric air entry Abdomen - soft, non-tender; bowel sounds normal; no masses,  no organomegaly Extremities - no joint deformities, effusion, or inflammation, no edema and no skin discoloration Skin -   Neurologic Examination: Mental Status: Alert, oriented, thought content appropriate.  Speech fluent without evidence of aphasia. Able to follow commands without difficulty. Cranial Nerves: II-Visual fields were normal. III/IV/VI-Pupils  were equal and reacted. Extraocular movements were full and conjugate.     V/VII-reduced perception to tactile stimulation on the left side of the face compared to the right; no facial weakness. VIII-normal. X-normal speech and symmetrical palatal movement. XI: trapezius strength/neck flexion strength normal bilaterally XII-midline tongue extension with normal strength. Motor: 5/5 bilaterally with normal tone and bulk Sensory: Reduced perception of tactile sensation over distal left upper extremity compared to the right. Sensory testing of lower extremities was normal. Deep Tendon Reflexes: 2+ and symmetric. Plantars: Mute bilaterally Cerebellar: Normal finger-to-nose testing. Carotid auscultation: Normal  Results for orders placed or performed during the hospital encounter of 06/27/14 (from the past 48 hour(s))  Protime-INR     Status: None   Collection Time: 06/27/14  5:02 PM  Result Value Ref Range   Prothrombin Time 13.2 11.6 - 15.2 seconds   INR 0.99 0.00 - 1.49  APTT     Status: None   Collection Time: 06/27/14  5:02 PM  Result Value Ref Range   aPTT 32 24 - 37 seconds  CBC     Status: Abnormal   Collection Time: 06/27/14  5:02 PM  Result Value Ref Range   WBC 12.0 (H) 4.0 - 10.5 K/uL   RBC 4.57 3.87 - 5.11 MIL/uL   Hemoglobin 13.6 12.0 - 15.0 g/dL   HCT 41.0 36.0 - 46.0 %   MCV 89.7 78.0 - 100.0 fL   MCH 29.8 26.0 - 34.0 pg   MCHC 33.2 30.0 - 36.0 g/dL   RDW 12.9 11.5 - 15.5 %   Platelets 314 150 - 400 K/uL  Differential     Status: None   Collection Time: 06/27/14  5:02 PM  Result Value Ref Range   Neutrophils Relative % 63 43 - 77 %   Neutro Abs 7.5 1.7 - 7.7 K/uL   Lymphocytes Relative 28 12 - 46 %   Lymphs Abs 3.4 0.7 - 4.0 K/uL   Monocytes Relative 7 3 - 12 %   Monocytes Absolute 0.9 0.1 - 1.0 K/uL   Eosinophils Relative 2 0 - 5 %   Eosinophils Absolute 0.2 0.0 - 0.7 K/uL   Basophils Relative 0 0 - 1 %   Basophils Absolute 0.0 0.0 - 0.1 K/uL  CBG monitoring, ED     Status: None   Collection Time: 06/27/14  5:23 PM  Result Value  Ref Range   Glucose-Capillary 94 70 - 99 mg/dL  I-stat troponin, ED (not at [facility])     Status: None   Collection Time: 06/27/14  5:26 PM  Result Value Ref Range   Troponin i, poc 0.00 0.00 - 0.08 ng/mL   Comment 3            Comment: Due to the release kinetics of cTnI, a negative result within the first hours of the onset of symptoms does not rule out myocardial infarction with certainty. If myocardial infarction is still suspected, repeat the test at appropriate intervals.   I-Stat Chem 8, ED     Status: Abnormal   Collection Time: 06/27/14  5:28 PM  Result Value Ref Range   Sodium 141 135 - 145 mmol/L   Potassium 3.5 3.5 - 5.1 mmol/L   Chloride 103 96 - 112 mmol/L   BUN 13 6 - 23 mg/dL   Creatinine, Ser 0.90 0.50 - 1.10 mg/dL   Glucose, Bld 110 (H) 70 - 99 mg/dL   Calcium, Ion 1.16 1.12 - 1.23 mmol/L   TCO2  23 0 - 100 mmol/L   Hemoglobin 14.6 12.0 - 15.0 g/dL   HCT 43.0 36.0 - 46.0 %   Ct Head Wo Contrast  06/27/2014   CLINICAL DATA:  Headache. Left tongue numbness and lip numbness. Left side numbness.  EXAM: CT HEAD WITHOUT CONTRAST  TECHNIQUE: Contiguous axial images were obtained from the base of the skull through the vertex without intravenous contrast.  COMPARISON:  None.  FINDINGS: No acute intracranial abnormality. Specifically, no hemorrhage, hydrocephalus, mass lesion, acute infarction, or significant intracranial injury. No acute calvarial abnormality. Visualized paranasal sinuses and mastoids clear. Orbital soft tissues unremarkable.  IMPRESSION: Negative.   Electronically Signed   By: Rolm Baptise M.D.   On: 06/27/2014 17:32    Assessment/Plan 34 year old lady presenting with probable complicated migraine headache with residual left sensory changes. He has no known risk factors for stroke. TIA or stroke is unlikely. Headache at this point is only mild and does not require treatment intervention.  Recommendations: 1. MRI of the brain without contrast. 2. Stroke workup  with risk assessment if MRI demonstrates an acute ischemic brain lesion; otherwise, if MRI is unremarkable no further neurological intervention is indicated.  C.R. Nicole Kindred, MD [facility] [phone]  06/27/2014, 6:06 PM

## 2014-06-27 NOTE — ED Notes (Signed)
Driven home by significant other. Verbalized understanding as to the importance to follow up with neurologist. Asked pertaining questions to diagnosis. Wheeled to lobby.

## 2014-06-27 NOTE — ED Notes (Signed)
Patient denies any numbness or the presence of an aura. States the symptoms have resolved. The only complaint now is a soreness in her right arm from trying to hold it straight or up in the air due to having an IV in her AC. Education regarding an IV was provided and a pillow to prop her arm on for comfort was provided which the patient stated provided immediate relief to her shoulder.

## 2014-06-27 NOTE — ED Notes (Signed)
Pt transported to MRI 

## 2014-07-13 ENCOUNTER — Ambulatory Visit: Payer: Self-pay | Admitting: Diagnostic Neuroimaging

## 2014-07-14 ENCOUNTER — Encounter: Payer: Self-pay | Admitting: Diagnostic Neuroimaging

## 2014-07-21 ENCOUNTER — Ambulatory Visit: Payer: Self-pay | Admitting: Diagnostic Neuroimaging

## 2014-07-22 ENCOUNTER — Encounter: Payer: Self-pay | Admitting: Diagnostic Neuroimaging

## 2014-12-02 ENCOUNTER — Inpatient Hospital Stay (HOSPITAL_COMMUNITY)
Admission: AD | Admit: 2014-12-02 | Discharge: 2014-12-02 | Disposition: A | Discharge status: Home / Self Care | Payer: Medicaid Other | Source: Ambulatory Visit | Attending: Obstetrics & Gynecology | Admitting: Obstetrics & Gynecology

## 2014-12-02 ENCOUNTER — Encounter (HOSPITAL_COMMUNITY): Payer: Self-pay

## 2014-12-02 ENCOUNTER — Inpatient Hospital Stay (HOSPITAL_COMMUNITY): Payer: Medicaid Other

## 2014-12-02 DIAGNOSIS — Z3A15 15 weeks gestation of pregnancy: Secondary | ICD-10-CM | POA: Insufficient documentation

## 2014-12-02 DIAGNOSIS — O3482 Maternal care for other abnormalities of pelvic organs, second trimester: Secondary | ICD-10-CM | POA: Insufficient documentation

## 2014-12-02 DIAGNOSIS — N8320 Unspecified ovarian cysts: Secondary | ICD-10-CM | POA: Diagnosis not present

## 2014-12-02 DIAGNOSIS — O0932 Supervision of pregnancy with insufficient antenatal care, second trimester: Secondary | ICD-10-CM | POA: Diagnosis not present

## 2014-12-02 DIAGNOSIS — O3442 Maternal care for other abnormalities of cervix, second trimester: Secondary | ICD-10-CM | POA: Insufficient documentation

## 2014-12-02 DIAGNOSIS — N841 Polyp of cervix uteri: Secondary | ICD-10-CM | POA: Diagnosis not present

## 2014-12-02 DIAGNOSIS — R109 Unspecified abdominal pain: Secondary | ICD-10-CM | POA: Diagnosis present

## 2014-12-02 DIAGNOSIS — Z87891 Personal history of nicotine dependence: Secondary | ICD-10-CM | POA: Diagnosis not present

## 2014-12-02 DIAGNOSIS — O4692 Antepartum hemorrhage, unspecified, second trimester: Secondary | ICD-10-CM

## 2014-12-02 DIAGNOSIS — O4412 Placenta previa with hemorrhage, second trimester: Secondary | ICD-10-CM | POA: Diagnosis not present

## 2014-12-02 DIAGNOSIS — O4440 Low lying placenta NOS or without hemorrhage, unspecified trimester: Secondary | ICD-10-CM

## 2014-12-02 DIAGNOSIS — N83209 Unspecified ovarian cyst, unspecified side: Secondary | ICD-10-CM

## 2014-12-02 HISTORY — DX: Other specified health status: Z78.9

## 2014-12-02 LAB — URINALYSIS, ROUTINE W REFLEX MICROSCOPIC
Bilirubin Urine: NEGATIVE
Glucose, UA: NEGATIVE mg/dL
Ketones, ur: NEGATIVE mg/dL
Nitrite: NEGATIVE
Protein, ur: NEGATIVE mg/dL
Specific Gravity, Urine: 1.015 (ref 1.005–1.030)
Urobilinogen, UA: 0.2 mg/dL (ref 0.0–1.0)
pH: 7 (ref 5.0–8.0)

## 2014-12-02 LAB — WET PREP, GENITAL
Clue Cells Wet Prep HPF POC: NONE SEEN
Trich, Wet Prep: NONE SEEN
Yeast Wet Prep HPF POC: NONE SEEN

## 2014-12-02 LAB — URINE MICROSCOPIC-ADD ON

## 2014-12-02 LAB — ABO/RH: ABO/RH(D): O POS

## 2014-12-02 LAB — OB RESULTS CONSOLE GC/CHLAMYDIA: Gonorrhea: NEGATIVE

## 2014-12-02 NOTE — MAU Note (Signed)
Went to PCP today was told to come here, having yellow vaginal discharge x 2 months, no odor, spotting off and on for about 2 weeks, lower abdominal pain intermittently for 3 weeks, LMP in May ? 9th.

## 2014-12-02 NOTE — MAU Provider Note (Signed)
History     CSN: [id number]  Arrival date and time: 12/02/14 1142   First Provider Initiated Contact with Patient 12/02/14 1320      Chief Complaint  Patient presents with  . Abdominal Pain   HPI   Veronica Obrien is a 34 y.o. female G2P1001 at [redacted]w[redacted]d presenting to MAU with abdominal pain and vaginal bleeding. The pain varies in location from her belly button down to her ovaries are. The pain comes and goes; the pain at times is sharp. She has not tried anything over the counter. She currently rates her pain 0/10; the pain is worse when she is sitting down.   The vaginal bleeding started a few days ago; the bleeding is very light. She has noticed it on her underwear, not just when she wipes.   She has not started prenatal care; is awaiting medicaid.   OB History    Gravida Para Term Preterm AB TAB SAB Ectopic Multiple Living   2 1 1       1       Past Medical History  Diagnosis Date  . Medical history non-contributory     Past Surgical History  Procedure Laterality Date  . Wisdom tooth extraction      Family History  Problem Relation Age of Onset  . Diabetes Other     Social History  Substance Use Topics  . Smoking status: Former Smoker -- 0.25 packs/day    Quit date: 06/06/2014  . Smokeless tobacco: Never Used  . Alcohol Use: Yes     Comment: NOT SINCE PREGNANCY    Allergies:  Allergies  Allergen Reactions  . Novocain [Procaine Hcl] Other (See Comments)    Makes patient bite tongue     Prescriptions prior to admission  Medication Sig Dispense Refill Last Dose  . Prenatal Vit-Fe Fumarate-FA (PRENATAL MULTIVITAMIN) TABS tablet Take 1 tablet by mouth daily at 12 noon.   12/01/2014 at Unknown time  . cyclobenzaprine (FLEXERIL) 10 MG tablet Take 1 tablet (10 mg total) by mouth 2 (two) times daily as needed for muscle spasms. (Patient not taking: Reported on 06/27/2014) 20 tablet 0 Not Taking at Unknown time  . HYDROcodone-acetaminophen (NORCO) 5-325 MG per  tablet Take 1 tablet by mouth every 6 (six) hours as needed. (Patient not taking: Reported on 06/27/2014) 20 tablet 0 Not Taking at Unknown time  . naproxen (NAPROSYN) 500 MG tablet Take 1 tablet (500 mg total) by mouth 2 (two) times daily. (Patient not taking: Reported on 06/27/2014) 30 tablet 0 Not Taking at Unknown time   Results for orders placed or performed during the hospital encounter of 12/02/14 (from the past 48 hour(s))  Urinalysis, Routine w reflex microscopic (not at [facility])     Status: Abnormal   Collection Time: 12/02/14 11:50 AM  Result Value Ref Range   Color, Urine YELLOW YELLOW   APPearance CLEAR CLEAR   Specific Gravity, Urine 1.015 1.005 - 1.030   pH 7.0 5.0 - 8.0   Glucose, UA NEGATIVE NEGATIVE mg/dL   Hgb urine dipstick SMALL (A) NEGATIVE   Bilirubin Urine NEGATIVE NEGATIVE   Ketones, ur NEGATIVE NEGATIVE mg/dL   Protein, ur NEGATIVE NEGATIVE mg/dL   Urobilinogen, UA 0.2 0.0 - 1.0 mg/dL   Nitrite NEGATIVE NEGATIVE   Leukocytes, UA LARGE (A) NEGATIVE  Urine microscopic-add on     Status: Abnormal   Collection Time: 12/02/14 11:50 AM  Result Value Ref Range   Squamous Epithelial / LPF RARE RARE  WBC, UA 7-10 <3 WBC/hpf   RBC / HPF 0-2 <3 RBC/hpf   Bacteria, UA FEW (A) RARE  Culture, OB Urine     Status: None (Preliminary result)   Collection Time: 12/02/14 11:50 AM  Result Value Ref Range   Specimen Description URINE, RANDOM    Special Requests Normal    Culture      TOO YOUNG TO READ Performed at [facility]    Report Status PENDING   Wet prep, genital     Status: Abnormal   Collection Time: 12/02/14  1:40 PM  Result Value Ref Range   Yeast Wet Prep HPF POC NONE SEEN NONE SEEN   Trich, Wet Prep NONE SEEN NONE SEEN   Clue Cells Wet Prep HPF POC NONE SEEN NONE SEEN   WBC, Wet Prep HPF POC MANY (A) NONE SEEN    Comment: MODERATE BACTERIA SEEN  ABO/Rh     Status: None   Collection Time: 12/02/14  1:47 PM  Result Value Ref Range   ABO/RH(D) O POS     HIV antibody     Status: None   Collection Time: 12/02/14  1:47 PM  Result Value Ref Range   HIV Screen 4th Generation wRfx Non Reactive Non Reactive    Comment: (NOTE) Performed At: [facility] [address] [place], [place] [id number] Lindon Romp MD [id number]     Review of Systems  Constitutional: Negative for fever.  Gastrointestinal: Negative for nausea, vomiting and abdominal pain.  Genitourinary: Negative for dysuria, urgency, frequency and hematuria.       + vaginal discharge for 2-3 weeks.  Occasional spotting-   Musculoskeletal: Positive for back pain.   Physical Exam   Blood pressure 126/66, pulse 81, temperature 98.2 F (36.8 C), temperature source Oral, resp. rate 16, height 5\' 7"  (1.702 m), weight 93.713 kg (206 lb 9.6 oz), last menstrual period 02/09/2014.  Physical Exam  Constitutional: She is oriented to person, place, and time. She appears well-developed and well-nourished. No distress.  HENT:  Head: Normocephalic.  Eyes: Pupils are equal, round, and reactive to light.  Neck: Neck supple.  Respiratory: Effort normal.  GI: Soft. She exhibits no distension. There is no tenderness. There is no rebound and no CVA tenderness.  Genitourinary:  Speculum exam: Vagina - Small amount of creamy discharge, no odor Cervix - + contact bleeding with swab insertion, cervical polyp noted; 1.5 cm. Polyp removed with ring forceps without difficulty. Minimal bleeding following removal.  Bimanual exam: Cervix closed Uterus non tender, gravid.  Adnexa non tender, no masses bilaterally GC/Chlam, wet prep done Chaperone present for exam.  Musculoskeletal: Normal range of motion.  Neurological: She is alert and oriented to person, place, and time.  Skin: Skin is warm. She is not diaphoretic.  Psychiatric: Her behavior is normal.    MAU Course  Procedures  None  MDM   Consulted with Dr. Gala Romney regarding cervical polyp.  + fetal heart tones.  [place]  O  positive blood type Assessed bleeding prior to discharge home; minimal to none.  Urine culture sent/ pending.  Polyp sent off for pathology; results pending   Assessment and Plan   A:  1. Cervical polyp   2. Second trimester bleeding   3. Insufficient prenatal care, second trimester   4. [redacted] weeks gestation of pregnancy   5. Low lying placenta, antepartum   6. Simple ovarian cyst    P:  Discharge home in stable condition Bleeding precautions  Pelvic  rest strictly enforced [facility] contact information provided; patient plans to call to schedule appointment Return to MAU if symptoms worsen  Ok to take tylenol as needed, if symptoms worsen.    Lezlie Lye, NP 12/03/2014 10:46 AM

## 2014-12-03 LAB — CULTURE, OB URINE: Special Requests: NORMAL

## 2014-12-03 LAB — GC/CHLAMYDIA PROBE AMP (~~LOC~~) NOT AT ARMC
Chlamydia: NEGATIVE
Neisseria Gonorrhea: NEGATIVE

## 2014-12-03 LAB — HIV ANTIBODY (ROUTINE TESTING W REFLEX): HIV Screen 4th Generation wRfx: NONREACTIVE

## 2014-12-05 ENCOUNTER — Inpatient Hospital Stay (HOSPITAL_COMMUNITY)
Admission: AD | Admit: 2014-12-05 | Discharge: 2014-12-05 | Disposition: A | Discharge status: Home / Self Care | Payer: Medicaid Other | Source: Ambulatory Visit | Attending: Obstetrics & Gynecology | Admitting: Obstetrics & Gynecology

## 2014-12-05 DIAGNOSIS — R102 Pelvic and perineal pain: Secondary | ICD-10-CM | POA: Diagnosis not present

## 2014-12-05 DIAGNOSIS — O26892 Other specified pregnancy related conditions, second trimester: Secondary | ICD-10-CM | POA: Insufficient documentation

## 2014-12-05 DIAGNOSIS — Z87891 Personal history of nicotine dependence: Secondary | ICD-10-CM | POA: Insufficient documentation

## 2014-12-05 DIAGNOSIS — N898 Other specified noninflammatory disorders of vagina: Secondary | ICD-10-CM | POA: Diagnosis not present

## 2014-12-05 LAB — URINALYSIS, ROUTINE W REFLEX MICROSCOPIC
Bilirubin Urine: NEGATIVE
Glucose, UA: NEGATIVE mg/dL
Hgb urine dipstick: NEGATIVE
Ketones, ur: NEGATIVE mg/dL
Leukocytes, UA: NEGATIVE
Nitrite: NEGATIVE
Protein, ur: NEGATIVE mg/dL
Specific Gravity, Urine: >1.03 — ABNORMAL HIGH (ref 1.005–1.030)
Urobilinogen, UA: 0.2 mg/dL (ref 0.0–1.0)
pH: 6 (ref 5.0–8.0)

## 2014-12-05 LAB — WET PREP, GENITAL
Clue Cells Wet Prep HPF POC: NONE SEEN
Trich, Wet Prep: NONE SEEN
Yeast Wet Prep HPF POC: NONE SEEN

## 2014-12-05 LAB — AMNISURE RUPTURE OF MEMBRANE (ROM) NOT AT ARMC: Amnisure ROM: NEGATIVE

## 2014-12-05 NOTE — MAU Provider Note (Signed)
History     CSN: [id number]  Arrival date and time: 12/05/14 1659   First Provider Initiated Contact with Patient 12/05/14 1817      Chief Complaint  Patient presents with  . Vaginal Pain  . Vaginal Discharge   HPI    Ms.Veronica Obrien is a 34 y.o. female G2P1001 at [redacted]w[redacted]d presenting with vaginal discomfort and vaginal discharge. She was seen 2 days ago and had a cervical polyp removed, since then she has noticed increased vaginal discharge.  She feels that her underwear have been wet constantly. She has not had a gush of fluid, but feels that she is leaking "something."  She denies vaginal bleeding  She has off and on abdominal cramping; she has not taken anything for the pain.    OB History    Gravida Para Term Preterm AB TAB SAB Ectopic Multiple Living   2 1 1       1       Past Medical History  Diagnosis Date  . Medical history non-contributory     Past Surgical History  Procedure Laterality Date  . Wisdom tooth extraction      Family History  Problem Relation Age of Onset  . Diabetes Other     Social History  Substance Use Topics  . Smoking status: Former Smoker -- 0.25 packs/day    Quit date: 06/06/2014  . Smokeless tobacco: Never Used  . Alcohol Use: Yes     Comment: NOT SINCE PREGNANCY    Allergies:  Allergies  Allergen Reactions  . Novocain [Procaine Hcl] Other (See Comments)    Makes patient bite tongue     Prescriptions prior to admission  Medication Sig Dispense Refill Last Dose  . Prenatal Vit-Fe Fumarate-FA (PRENATAL MULTIVITAMIN) TABS tablet Take 1 tablet by mouth daily at 12 noon.   12/05/2014 at Unknown time   Results for orders placed or performed during the hospital encounter of 12/05/14 (from the past 48 hour(s))  Urinalysis, Routine w reflex microscopic (not at [facility])     Status: Abnormal   Collection Time: 12/05/14  5:12 PM  Result Value Ref Range   Color, Urine YELLOW YELLOW   APPearance CLEAR CLEAR   Specific Gravity, Urine >1.030  (H) 1.005 - 1.030   pH 6.0 5.0 - 8.0   Glucose, UA NEGATIVE NEGATIVE mg/dL   Hgb urine dipstick NEGATIVE NEGATIVE   Bilirubin Urine NEGATIVE NEGATIVE   Ketones, ur NEGATIVE NEGATIVE mg/dL   Protein, ur NEGATIVE NEGATIVE mg/dL   Urobilinogen, UA 0.2 0.0 - 1.0 mg/dL   Nitrite NEGATIVE NEGATIVE   Leukocytes, UA NEGATIVE NEGATIVE    Comment: MICROSCOPIC NOT DONE ON URINES WITH NEGATIVE PROTEIN, BLOOD, LEUKOCYTES, NITRITE, OR GLUCOSE <1000 mg/dL.  Wet prep, genital     Status: Abnormal   Collection Time: 12/05/14  6:21 PM  Result Value Ref Range   Yeast Wet Prep HPF POC NONE SEEN NONE SEEN   Trich, Wet Prep NONE SEEN NONE SEEN   Clue Cells Wet Prep HPF POC NONE SEEN NONE SEEN   WBC, Wet Prep HPF POC FEW (A) NONE SEEN    Comment: FEW BACTERIA SEEN  Amnisure rupture of membrane (rom)not at [facility]     Status: None   Collection Time: 12/05/14  6:33 PM  Result Value Ref Range   Amnisure ROM NEGATIVE     Review of Systems  Constitutional: Positive for chills. Negative for fever.  Genitourinary: Negative for dysuria.   Physical Exam   Blood  pressure 121/73, pulse 99, temperature 98.2 F (36.8 C), temperature source Oral, resp. rate 16, last menstrual period 08/14/2014, SpO2 100 %.  Physical Exam  Constitutional: She is oriented to person, place, and time. She appears well-developed and well-nourished. No distress.  HENT:  Head: Normocephalic.  Eyes: Pupils are equal, round, and reactive to light.  Genitourinary:  Speculum exam: Vagina - Moderate amount of creamy, mucus like discharge, no odor. No pooling of fluid in the vagina.  Cervix - No contact bleeding Bimanual exam: deferred  wet prep done, amnisure  Chaperone present for exam.  Musculoskeletal: Normal range of motion.  Neurological: She is alert and oriented to person, place, and time.  Skin: Skin is warm. She is not diaphoretic.  Psychiatric: Her behavior is normal.    MAU Course  Procedures  None  MDM +  fht Amnisure negative  Wet prep; normal.   Assessment and Plan   A:  1. Vaginal pain   2. Vaginal discharge     P:  Discharge home in stable condition Continue pelvic rest Return to MAU if symptoms worsen  Call the [place] to schedule an appointment  [employer] to take tylenol as directed on the bottle    Lezlie Lye, NP 12/05/2014 6:18 PM

## 2014-12-05 NOTE — MAU Note (Signed)
Patient was seen in MAU for vaginal discharge and pain on 8/31 and states she had a polyp removed.  She presents to MAU again with c/o 3/10 pain that has been happening since she went home and states the discharge is yellow and foul smelling.  Also notices pink blood when using the bathroom.

## 2014-12-24 ENCOUNTER — Other Ambulatory Visit (HOSPITAL_COMMUNITY)
Admission: RE | Admit: 2014-12-24 | Discharge: 2014-12-24 | Disposition: A | Discharge status: Home / Self Care | Payer: Medicaid Other | Source: Ambulatory Visit | Attending: Obstetrics and Gynecology | Admitting: Obstetrics and Gynecology

## 2014-12-24 ENCOUNTER — Encounter: Payer: Self-pay | Admitting: Obstetrics and Gynecology

## 2014-12-24 ENCOUNTER — Ambulatory Visit (INDEPENDENT_AMBULATORY_CARE_PROVIDER_SITE_OTHER): Payer: Medicaid Other | Admitting: Obstetrics and Gynecology

## 2014-12-24 VITALS — BP 117/80 | HR 82 | Temp 97.8°F | Wt 208.9 lb

## 2014-12-24 DIAGNOSIS — O3482 Maternal care for other abnormalities of pelvic organs, second trimester: Secondary | ICD-10-CM | POA: Diagnosis not present

## 2014-12-24 DIAGNOSIS — Z01419 Encounter for gynecological examination (general) (routine) without abnormal findings: Secondary | ICD-10-CM | POA: Diagnosis present

## 2014-12-24 DIAGNOSIS — N8320 Unspecified ovarian cysts: Secondary | ICD-10-CM | POA: Diagnosis not present

## 2014-12-24 DIAGNOSIS — E669 Obesity, unspecified: Secondary | ICD-10-CM | POA: Diagnosis not present

## 2014-12-24 DIAGNOSIS — N83201 Unspecified ovarian cyst, right side: Secondary | ICD-10-CM | POA: Insufficient documentation

## 2014-12-24 DIAGNOSIS — Z124 Encounter for screening for malignant neoplasm of cervix: Secondary | ICD-10-CM | POA: Diagnosis not present

## 2014-12-24 DIAGNOSIS — Z1151 Encounter for screening for human papillomavirus (HPV): Secondary | ICD-10-CM

## 2014-12-24 DIAGNOSIS — O4440 Low lying placenta NOS or without hemorrhage, unspecified trimester: Secondary | ICD-10-CM | POA: Insufficient documentation

## 2014-12-24 DIAGNOSIS — O99212 Obesity complicating pregnancy, second trimester: Secondary | ICD-10-CM | POA: Diagnosis not present

## 2014-12-24 DIAGNOSIS — O0992 Supervision of high risk pregnancy, unspecified, second trimester: Secondary | ICD-10-CM | POA: Diagnosis not present

## 2014-12-24 DIAGNOSIS — O4402 Placenta previa specified as without hemorrhage, second trimester: Secondary | ICD-10-CM

## 2014-12-24 DIAGNOSIS — O4412 Placenta previa with hemorrhage, second trimester: Secondary | ICD-10-CM | POA: Diagnosis present

## 2014-12-24 DIAGNOSIS — N83209 Unspecified ovarian cyst, unspecified side: Secondary | ICD-10-CM

## 2014-12-24 DIAGNOSIS — O3480 Maternal care for other abnormalities of pelvic organs, unspecified trimester: Secondary | ICD-10-CM

## 2014-12-24 DIAGNOSIS — O0990 Supervision of high risk pregnancy, unspecified, unspecified trimester: Secondary | ICD-10-CM | POA: Insufficient documentation

## 2014-12-24 HISTORY — DX: Unspecified ovarian cyst, right side: N83.201

## 2014-12-24 LAB — POCT URINALYSIS DIP (DEVICE)
Bilirubin Urine: NEGATIVE
Glucose, UA: NEGATIVE mg/dL
Ketones, ur: NEGATIVE mg/dL
Nitrite: NEGATIVE
Protein, ur: NEGATIVE mg/dL
Specific Gravity, Urine: 1.015 (ref 1.005–1.030)
Urobilinogen, UA: 0.2 mg/dL (ref 0.0–1.0)
pH: 6.5 (ref 5.0–8.0)

## 2014-12-24 NOTE — Progress Notes (Signed)
New OB packet given  No bleeding since she went to MAU

## 2014-12-24 NOTE — Progress Notes (Signed)
   Subjective:    Veronica Obrien is a G2P1001 [redacted]w[redacted]d being seen today for her first obstetrical visit.  Her obstetrical history is significant for obesity. Patient does intend to breast feed. Pregnancy history fully reviewed.  Patient reports no complaints.  Filed Vitals:   12/24/14 0902  BP: 117/80  Pulse: 82  Temp: 97.8 F (36.6 C)  Weight: 208 lb 14.4 oz (94.756 kg)    HISTORY: OB History  Gravida Para Term Preterm AB SAB TAB Ectopic Multiple Living  2 1 1  0 0 0 0 0 0 1    # Outcome Date GA Lbr Len/2nd Weight Sex Delivery Anes PTL Lv  2 Current           1 Term 05/18/06 [redacted]w[redacted]d  6 lb 4 oz (2.835 kg) F Vag-Spont EPI N Y     Past Medical History  Diagnosis Date  . Medical history non-contributory    Past Surgical History  Procedure Laterality Date  . Wisdom tooth extraction     Family History  Problem Relation Age of Onset  . Arthritis Father   . Cancer Sister   . Heart disease Maternal Grandfather   . Diabetes Maternal Grandfather   . Diabetes Paternal Grandmother   . Cancer Paternal Grandmother   . Diabetes Paternal Grandfather      Exam    Uterus:     Pelvic Exam:    Perineum: No Hemorrhoids, Normal Perineum   Vulva: normal   Vagina:  normal mucosa, normal discharge   pH:    Cervix: closed and long   Adnexa: not evaluated   Bony Pelvis: gynecoid  System: Breast:  normal appearance, no masses or tenderness   Skin: normal coloration and turgor, no rashes    Neurologic: oriented, no focal deficits   Extremities: normal strength, tone, and muscle mass   HEENT extra ocular movement intact   Mouth/Teeth mucous membranes moist, pharynx normal without lesions and dental hygiene good   Neck supple and no masses   Cardiovascular: regular rate and rhythm   Respiratory:  chest clear, no wheezing, crepitations, rhonchi, normal symmetric air entry   Abdomen: soft, non-tender; bowel sounds normal; no masses,  no organomegaly   Urinary:       Assessment:    Pregnancy: G2P1001 Patient Active Problem List   Diagnosis Date Noted  . Supervision of high risk pregnancy, antepartum 12/24/2014    Priority: Medium  . Low lying placenta, antepartum 12/24/2014    Priority: Medium  . Obesity (BMI 30-39.9) 12/24/2014    Priority: Medium  . Complicated migraine 09/62/8366        Plan:     Initial labs drawn. Prenatal vitamins. Problem list reviewed and updated. Genetic Screening discussed Quad Screen: requested.  Ultrasound discussed; fetal survey: ordered. 1 hour GCT today Discussed implication of low lying placenta- will follow up on anatomy  Follow up in 4 weeks. 50% of 30 min visit spent on counseling and coordination of care.     CONSTANT,PEGGY 12/24/2014

## 2014-12-24 NOTE — Progress Notes (Signed)
Nutrition note: 1st visit consult Pt has h/o obesity. Pt has gained 10.9# @ [redacted]w[redacted]d, which is > expected. Pt reports eating 3 meals & 1 snack/d. Pt reports taking a PNV. Pt reports no N&V but has some heartburn. NKFA. Pt received verbal & written education on general nutrition during pregnancy. Discussed wt gain goals of 11-20# or 0.5#/wk. Pt agrees to continue taking PNV. Pt has [place] V[place] & plans to BF. F/u as needed Vladimir Faster, MS, RD, LDN, [facility]

## 2014-12-24 NOTE — Progress Notes (Signed)
Ultrasound scheduled for 01/04/2015@ 2:00PM

## 2014-12-24 NOTE — Patient Instructions (Signed)
Second Trimester of Pregnancy The second trimester is from week 13 through week 28, months 4 through 6. The second trimester is often a time when you feel your best. Your body has also adjusted to being pregnant, and you begin to feel better physically. Usually, morning sickness has lessened or quit completely, you may have more energy, and you may have an increase in appetite. The second trimester is also a time when the fetus is growing rapidly. At the end of the sixth month, the fetus is about 9 inches long and weighs about 1 pounds. You will likely begin to feel the baby move (quickening) between 18 and 20 weeks of the pregnancy. BODY CHANGES Your body goes through many changes during pregnancy. The changes vary from woman to woman.   Your weight will continue to increase. You will notice your lower abdomen bulging out.  You may begin to get stretch marks on your hips, abdomen, and breasts.  You may develop headaches that can be relieved by medicines approved by your health care provider.  You may urinate more often because the fetus is pressing on your bladder.  You may develop or continue to have heartburn as a result of your pregnancy.  You may develop constipation because certain hormones are causing the muscles that push waste through your intestines to slow down.  You may develop hemorrhoids or swollen, bulging veins (varicose veins).  You may have back pain because of the weight gain and pregnancy hormones relaxing your joints between the bones in your pelvis and as a result of a shift in weight and the muscles that support your balance.  Your breasts will continue to grow and be tender.  Your gums may bleed and may be sensitive to brushing and flossing.  Dark spots or blotches (chloasma, mask of pregnancy) may develop on your face. This will likely fade after the baby is born.  A dark line from your belly button to the pubic area (linea nigra) may appear. This will likely  fade after the baby is born.  You may have changes in your hair. These can include thickening of your hair, rapid growth, and changes in texture. Some women also have hair loss during or after pregnancy, or hair that feels dry or thin. Your hair will most likely return to normal after your baby is born. WHAT TO EXPECT AT YOUR PRENATAL VISITS During a routine prenatal visit:  You will be weighed to make sure you and the fetus are growing normally.  Your blood pressure will be taken.  Your abdomen will be measured to track your baby's growth.  The fetal heartbeat will be listened to.  Any test results from the previous visit will be discussed. Your health care provider may ask you:  How you are feeling.  If you are feeling the baby move.  If you have had any abnormal symptoms, such as leaking fluid, bleeding, severe headaches, or abdominal cramping.  If you have any questions. Other tests that may be performed during your second trimester include:  Blood tests that check for:  Low iron levels (anemia).  Gestational diabetes (between 24 and 28 weeks).  Rh antibodies.  Urine tests to check for infections, diabetes, or protein in the urine.  An ultrasound to confirm the proper growth and development of the baby.  An amniocentesis to check for possible genetic problems.  Fetal screens for spina bifida and Down syndrome. HOME CARE INSTRUCTIONS   Avoid all smoking, herbs, alcohol, and unprescribed   drugs. These chemicals affect the formation and growth of the baby.  Follow your health care provider's instructions regarding medicine use. There are medicines that are either safe or unsafe to take during pregnancy.  Exercise only as directed by your health care provider. Experiencing uterine cramps is a good sign to stop exercising.  Continue to eat regular, healthy meals.  Wear a good support bra for breast tenderness.  Do not use hot tubs, steam rooms, or saunas.  Wear  your seat belt at all times when driving.  Avoid raw meat, uncooked cheese, cat litter boxes, and soil used by cats. These carry germs that can cause birth defects in the baby.  Take your prenatal vitamins.  Try taking a stool softener (if your health care provider approves) if you develop constipation. Eat more high-fiber foods, such as fresh vegetables or fruit and whole grains. Drink plenty of fluids to keep your urine clear or pale yellow.  Take warm sitz baths to soothe any pain or discomfort caused by hemorrhoids. Use hemorrhoid cream if your health care provider approves.  If you develop varicose veins, wear support hose. Elevate your feet for 15 minutes, 3-4 times a day. Limit salt in your diet.  Avoid heavy lifting, wear low heel shoes, and practice good posture.  Rest with your legs elevated if you have leg cramps or low back pain.  Visit your dentist if you have not gone yet during your pregnancy. Use a soft toothbrush to brush your teeth and be gentle when you floss.  A sexual relationship may be continued unless your health care provider directs you otherwise.  Continue to go to all your prenatal visits as directed by your health care provider. SEEK MEDICAL CARE IF:   You have dizziness.  You have mild pelvic cramps, pelvic pressure, or nagging pain in the abdominal area.  You have persistent nausea, vomiting, or diarrhea.  You have a bad smelling vaginal discharge.  You have pain with urination. SEEK IMMEDIATE MEDICAL CARE IF:   You have a fever.  You are leaking fluid from your vagina.  You have spotting or bleeding from your vagina.  You have severe abdominal cramping or pain.  You have rapid weight gain or loss.  You have shortness of breath with chest pain.  You notice sudden or extreme swelling of your face, hands, ankles, feet, or legs.  You have not felt your baby move in over an hour.  You have severe headaches that do not go away with  medicine.  You have vision changes. Document Released: 03/14/2001 Document Revised: 03/25/2013 Document Reviewed: 05/21/2012 [facility] Patient Information 2015 [place], [place]. This information is not intended to replace advice given to you by your health care provider. Make sure you discuss any questions you have with your health care provider.  Contraception Choices Contraception (birth control) is the use of any methods or devices to prevent pregnancy. Below are some methods to help avoid pregnancy. HORMONAL METHODS   Contraceptive implant. This is a thin, plastic tube containing progesterone hormone. It does not contain estrogen hormone. Your health care provider inserts the tube in the inner part of the upper arm. The tube can remain in place for up to 3 years. After 3 years, the implant must be removed. The implant prevents the ovaries from releasing an egg (ovulation), thickens the cervical mucus to prevent sperm from entering the uterus, and thins the lining of the inside of the uterus.  Progesterone-only injections. These injections are given  every 3 months by your health care provider to prevent pregnancy. This synthetic progesterone hormone stops the ovaries from releasing eggs. It also thickens cervical mucus and changes the uterine lining. This makes it harder for sperm to survive in the uterus.  Birth control pills. These pills contain estrogen and progesterone hormone. They work by preventing the ovaries from releasing eggs (ovulation). They also cause the cervical mucus to thicken, preventing the sperm from entering the uterus. Birth control pills are prescribed by a health care provider.Birth control pills can also be used to treat heavy periods.  Minipill. This type of birth control pill contains only the progesterone hormone. They are taken every day of each month and must be prescribed by your health care provider.  Birth control patch. The patch contains hormones similar to  those in birth control pills. It must be changed once a week and is prescribed by a health care provider.  Vaginal ring. The ring contains hormones similar to those in birth control pills. It is left in the vagina for 3 weeks, removed for 1 week, and then a new one is put back in place. The patient must be comfortable inserting and removing the ring from the vagina.A health care provider's prescription is necessary.  Emergency contraception. Emergency contraceptives prevent pregnancy after unprotected sexual intercourse. This pill can be taken right after sex or up to 5 days after unprotected sex. It is most effective the sooner you take the pills after having sexual intercourse. Most emergency contraceptive pills are available without a prescription. Check with your pharmacist. Do not use emergency contraception as your only form of birth control. BARRIER METHODS   Female condom. This is a thin sheath (latex or rubber) that is worn over the penis during sexual intercourse. It can be used with spermicide to increase effectiveness.  Female condom. This is a soft, loose-fitting sheath that is put into the vagina before sexual intercourse.  Diaphragm. This is a soft, latex, dome-shaped barrier that must be fitted by a health care provider. It is inserted into the vagina, along with a spermicidal jelly. It is inserted before intercourse. The diaphragm should be left in the vagina for 6 to 8 hours after intercourse.  Cervical cap. This is a round, soft, latex or plastic cup that fits over the cervix and must be fitted by a health care provider. The cap can be left in place for up to 48 hours after intercourse.  Sponge. This is a soft, circular piece of polyurethane foam. The sponge has spermicide in it. It is inserted into the vagina after wetting it and before sexual intercourse.  Spermicides. These are chemicals that kill or block sperm from entering the cervix and uterus. They come in the form of  creams, jellies, suppositories, foam, or tablets. They do not require a prescription. They are inserted into the vagina with an applicator before having sexual intercourse. The process must be repeated every time you have sexual intercourse. INTRAUTERINE CONTRACEPTION  Intrauterine device (IUD). This is a T-shaped device that is put in a woman's uterus during a menstrual period to prevent pregnancy. There are 2 types:  Copper IUD. This type of IUD is wrapped in copper wire and is placed inside the uterus. Copper makes the uterus and fallopian tubes produce a fluid that kills sperm. It can stay in place for 10 years.  Hormone IUD. This type of IUD contains the hormone progestin (synthetic progesterone). The hormone thickens the cervical mucus and prevents sperm from  entering the uterus, and it also thins the uterine lining to prevent implantation of a fertilized egg. The hormone can weaken or kill the sperm that get into the uterus. It can stay in place for 3-5 years, depending on which type of IUD is used. PERMANENT METHODS OF CONTRACEPTION  Female tubal ligation. This is when the woman's fallopian tubes are surgically sealed, tied, or blocked to prevent the egg from traveling to the uterus.  Hysteroscopic sterilization. This involves placing a small coil or insert into each fallopian tube. Your doctor uses a technique called hysteroscopy to do the procedure. The device causes scar tissue to form. This results in permanent blockage of the fallopian tubes, so the sperm cannot fertilize the egg. It takes about 3 months after the procedure for the tubes to become blocked. You must use another form of birth control for these 3 months.  Female sterilization. This is when the female has the tubes that carry sperm tied off (vasectomy).This blocks sperm from entering the vagina during sexual intercourse. After the procedure, the man can still ejaculate fluid (semen). NATURAL PLANNING METHODS  Natural family  planning. This is not having sexual intercourse or using a barrier method (condom, diaphragm, cervical cap) on days the woman could become pregnant.  Calendar method. This is keeping track of the length of each menstrual cycle and identifying when you are fertile.  Ovulation method. This is avoiding sexual intercourse during ovulation.  Symptothermal method. This is avoiding sexual intercourse during ovulation, using a thermometer and ovulation symptoms.  Post-ovulation method. This is timing sexual intercourse after you have ovulated. Regardless of which type or method of contraception you choose, it is important that you use condoms to protect against the transmission of sexually transmitted infections (STIs). Talk with your health care provider about which form of contraception is most appropriate for you. Document Released: 03/20/2005 Document Revised: 03/25/2013 Document Reviewed: 09/12/2012 [facility] Patient Information 2015 [place], [place]. This information is not intended to replace advice given to you by your health care provider. Make sure you discuss any questions you have with your health care provider.  Breastfeeding Deciding to breastfeed is one of the best choices you can make for you and your baby. A change in hormones during pregnancy causes your breast tissue to grow and increases the number and size of your milk ducts. These hormones also allow proteins, sugars, and fats from your blood supply to make breast milk in your milk-producing glands. Hormones prevent breast milk from being released before your baby is born as well as prompt milk flow after birth. Once breastfeeding has begun, thoughts of your baby, as well as his or her sucking or crying, can stimulate the release of milk from your milk-producing glands.  BENEFITS OF BREASTFEEDING For Your Baby  Your first milk (colostrum) helps your baby's digestive system function better.   There are antibodies in your milk that  help your baby fight off infections.   Your baby has a lower incidence of asthma, allergies, and sudden infant death syndrome.   The nutrients in breast milk are better for your baby than infant formulas and are designed uniquely for your baby's needs.   Breast milk improves your baby's brain development.   Your baby is less likely to develop other conditions, such as childhood obesity, asthma, or type 2 diabetes mellitus.  For You   Breastfeeding helps to create a very special bond between you and your baby.   Breastfeeding is convenient. Breast milk is  always available at the correct temperature and costs nothing.   Breastfeeding helps to burn calories and helps you lose the weight gained during pregnancy.   Breastfeeding makes your uterus contract to its prepregnancy size faster and slows bleeding (lochia) after you give birth.   Breastfeeding helps to lower your risk of developing type 2 diabetes mellitus, osteoporosis, and breast or ovarian cancer later in life. SIGNS THAT YOUR BABY IS HUNGRY Early Signs of Hunger  Increased alertness or activity.  Stretching.  Movement of the head from side to side.  Movement of the head and opening of the mouth when the corner of the mouth or cheek is stroked (rooting).  Increased sucking sounds, smacking lips, cooing, sighing, or squeaking.  Hand-to-mouth movements.  Increased sucking of fingers or hands. Late Signs of Hunger  Fussing.  Intermittent crying. Extreme Signs of Hunger Signs of extreme hunger will require calming and consoling before your baby will be able to breastfeed successfully. Do not wait for the following signs of extreme hunger to occur before you initiate breastfeeding:   Restlessness.  A loud, strong cry.   Screaming. BREASTFEEDING BASICS Breastfeeding Initiation  Find a comfortable place to sit or lie down, with your neck and back well supported.  Place a pillow or rolled up blanket  under your baby to bring him or her to the level of your breast (if you are seated). Nursing pillows are specially designed to help support your arms and your baby while you breastfeed.  Make sure that your baby's abdomen is facing your abdomen.   Gently massage your breast. With your fingertips, massage from your chest wall toward your nipple in a circular motion. This encourages milk flow. You may need to continue this action during the feeding if your milk flows slowly.  Support your breast with 4 fingers underneath and your thumb above your nipple. Make sure your fingers are well away from your nipple and your baby's mouth.   Stroke your baby's lips gently with your finger or nipple.   When your baby's mouth is open wide enough, quickly bring your baby to your breast, placing your entire nipple and as much of the colored area around your nipple (areola) as possible into your baby's mouth.   More areola should be visible above your baby's upper lip than below the lower lip.   Your baby's tongue should be between his or her lower gum and your breast.   Ensure that your baby's mouth is correctly positioned around your nipple (latched). Your baby's lips should create a seal on your breast and be turned out (everted).  It is common for your baby to suck about 2-3 minutes in order to start the flow of breast milk. Latching Teaching your baby how to latch on to your breast properly is very important. An improper latch can cause nipple pain and decreased milk supply for you and poor weight gain in your baby. Also, if your baby is not latched onto your nipple properly, he or she may swallow some air during feeding. This can make your baby fussy. Burping your baby when you switch breasts during the feeding can help to get rid of the air. However, teaching your baby to latch on properly is still the best way to prevent fussiness from swallowing air while breastfeeding. Signs that your baby has  successfully latched on to your nipple:    Silent tugging or silent sucking, without causing you pain.   Swallowing heard between every 3-4  sucks.    Muscle movement above and in front of his or her ears while sucking.  Signs that your baby has not successfully latched on to nipple:   Sucking sounds or smacking sounds from your baby while breastfeeding.  Nipple pain. If you think your baby has not latched on correctly, slip your finger into the corner of your baby's mouth to break the suction and place it between your baby's gums. Attempt breastfeeding initiation again. Signs of Successful Breastfeeding Signs from your baby:   A gradual decrease in the number of sucks or complete cessation of sucking.   Falling asleep.   Relaxation of his or her body.   Retention of a small amount of milk in his or her mouth.   Letting go of your breast by himself or herself. Signs from you:  Breasts that have increased in firmness, weight, and size 1-3 hours after feeding.   Breasts that are softer immediately after breastfeeding.  Increased milk volume, as well as a change in milk consistency and color by the fifth day of breastfeeding.   Nipples that are not sore, cracked, or bleeding. Signs That Your [employer] is Getting Enough Milk  Wetting at least 3 diapers in a 24-hour period. The urine should be clear and pale yellow by age 63 days.  At least 3 stools in a 24-hour period by age 63 days. The stool should be soft and yellow.  At least 3 stools in a 24-hour period by age 34 days. The stool should be seedy and yellow.  No loss of weight greater than 10% of birth weight during the first 12 days of age.  Average weight gain of 4-7 ounces (113-198 g) per week after age 83 days.  Consistent daily weight gain by age 1 days, without weight loss after the age of 2 weeks. After a feeding, your baby may spit up a small amount. This is common. BREASTFEEDING FREQUENCY AND DURATION Frequent  feeding will help you make more milk and can prevent sore nipples and breast engorgement. Breastfeed when you feel the need to reduce the fullness of your breasts or when your baby shows signs of hunger. This is called "breastfeeding on demand." Avoid introducing a pacifier to your baby while you are working to establish breastfeeding (the first 4-6 weeks after your baby is born). After this time you may choose to use a pacifier. Research has shown that pacifier use during the first year of a baby's life decreases the risk of sudden infant death syndrome (SIDS). Allow your baby to feed on each breast as long as he or she wants. Breastfeed until your baby is finished feeding. When your baby unlatches or falls asleep while feeding from the first breast, offer the second breast. Because newborns are often sleepy in the first few weeks of life, you may need to awaken your baby to get him or her to feed. Breastfeeding times will vary from baby to baby. However, the following rules can serve as a guide to help you ensure that your baby is properly fed:  Newborns (babies 27 weeks of age or younger) may breastfeed every 1-3 hours.  Newborns should not go longer than 3 hours during the day or 5 hours during the night without breastfeeding.  You should breastfeed your baby a minimum of 8 times in a 24-hour period until you begin to introduce solid foods to your baby at around 61 months of age. BREAST MILK PUMPING Pumping and storing breast milk allows  you to ensure that your baby is exclusively fed your breast milk, even at times when you are unable to breastfeed. This is especially important if you are going back to work while you are still breastfeeding or when you are not able to be present during feedings. Your lactation consultant can give you guidelines on how long it is safe to store breast milk.  A breast pump is a machine that allows you to pump milk from your breast into a sterile bottle. The pumped breast  milk can then be stored in a refrigerator or freezer. Some breast pumps are operated by hand, while others use electricity. Ask your lactation consultant which type will work best for you. Breast pumps can be purchased, but some hospitals and breastfeeding support groups lease breast pumps on a monthly basis. A lactation consultant can teach you how to hand express breast milk, if you prefer not to use a pump.  CARING FOR YOUR BREASTS WHILE YOU BREASTFEED Nipples can become dry, cracked, and sore while breastfeeding. The following recommendations can help keep your breasts moisturized and healthy:  Avoid using soap on your nipples.   Wear a supportive bra. Although not required, special nursing bras and tank tops are designed to allow access to your breasts for breastfeeding without taking off your entire bra or top. Avoid wearing underwire-style bras or extremely tight bras.  Air dry your nipples for 3-56minutes after each feeding.   Use only cotton bra pads to absorb leaked breast milk. Leaking of breast milk between feedings is normal.   Use lanolin on your nipples after breastfeeding. Lanolin helps to maintain your skin's normal moisture barrier. If you use pure lanolin, you do not need to wash it off before feeding your baby again. Pure lanolin is not toxic to your baby. You may also hand express a few drops of breast milk and gently massage that milk into your nipples and allow the milk to air dry. In the first few weeks after giving birth, some women experience extremely full breasts (engorgement). Engorgement can make your breasts feel heavy, warm, and tender to the touch. Engorgement peaks within 3-5 days after you give birth. The following recommendations can help ease engorgement:  Completely empty your breasts while breastfeeding or pumping. You may want to start by applying warm, moist heat (in the shower or with warm water-soaked hand towels) just before feeding or pumping. This  increases circulation and helps the milk flow. If your baby does not completely empty your breasts while breastfeeding, pump any extra milk after he or she is finished.  Wear a snug bra (nursing or regular) or tank top for 1-2 days to signal your body to slightly decrease milk production.  Apply ice packs to your breasts, unless this is too uncomfortable for you.  Make sure that your baby is latched on and positioned properly while breastfeeding. If engorgement persists after 48 hours of following these recommendations, contact your health care provider or a Science writer. OVERALL HEALTH CARE RECOMMENDATIONS WHILE BREASTFEEDING  Eat healthy foods. Alternate between meals and snacks, eating 3 of each per day. Because what you eat affects your breast milk, some of the foods may make your baby more irritable than usual. Avoid eating these foods if you are sure that they are negatively affecting your baby.  Drink milk, fruit juice, and water to satisfy your thirst (about 10 glasses a day).   Rest often, relax, and continue to take your prenatal vitamins to prevent fatigue,  stress, and anemia.  Continue breast self-awareness checks.  Avoid chewing and smoking tobacco.  Avoid alcohol and drug use. Some medicines that may be harmful to your baby can pass through breast milk. It is important to ask your health care provider before taking any medicine, including all over-the-counter and prescription medicine as well as vitamin and herbal supplements. It is possible to become pregnant while breastfeeding. If birth control is desired, ask your health care provider about options that will be safe for your baby. SEEK MEDICAL CARE IF:   You feel like you want to stop breastfeeding or have become frustrated with breastfeeding.  You have painful breasts or nipples.  Your nipples are cracked or bleeding.  Your breasts are red, tender, or warm.  You have a swollen area on either breast.  You  have a fever or chills.  You have nausea or vomiting.  You have drainage other than breast milk from your nipples.  Your breasts do not become full before feedings by the fifth day after you give birth.  You feel sad and depressed.  Your baby is too sleepy to eat well.  Your baby is having trouble sleeping.   Your baby is wetting less than 3 diapers in a 24-hour period.  Your baby has less than 3 stools in a 24-hour period.  Your baby's skin or the Tedeschi part of his or her eyes becomes yellow.   Your baby is not gaining weight by 58 days of age. SEEK IMMEDIATE MEDICAL CARE IF:   Your baby is overly tired (lethargic) and does not want to wake up and feed.  Your baby develops an unexplained fever. Document Released: 03/20/2005 Document Revised: 03/25/2013 Document Reviewed: 09/11/2012 [facility] Patient Information 2015 [place], [place]. This information is not intended to replace advice given to you by your health care provider. Make sure you discuss any questions you have with your health care provider.

## 2014-12-25 LAB — PRESCRIPTION MONITORING PROFILE (19 PANEL)
Amphetamine/Meth: NEGATIVE ng/mL
Barbiturate Screen, Urine: NEGATIVE ng/mL
Benzodiazepine Screen, Urine: NEGATIVE ng/mL
Buprenorphine, Urine: NEGATIVE ng/mL
Cannabinoid Scrn, Ur: NEGATIVE ng/mL
Carisoprodol, Urine: NEGATIVE ng/mL
Cocaine Metabolites: NEGATIVE ng/mL
Creatinine, Urine: 58.54 mg/dL (ref >20.0)
Fentanyl, Ur: NEGATIVE ng/mL
MDMA URINE: NEGATIVE ng/mL
Meperidine, Ur: NEGATIVE ng/mL
Methadone Screen, Urine: NEGATIVE ng/mL
Methaqualone: NEGATIVE ng/mL
Nitrites, Initial: NEGATIVE ug/mL
Opiate Screen, Urine: NEGATIVE ng/mL
Oxycodone Screen, Ur: NEGATIVE ng/mL
Phencyclidine, Ur: NEGATIVE ng/mL
Propoxyphene: NEGATIVE ng/mL
Tapentadol, urine: NEGATIVE ng/mL
Tramadol Scrn, Ur: NEGATIVE ng/mL
Zolpidem, Urine: NEGATIVE ng/mL
pH, Initial: 6 pH (ref 4.5–8.9)

## 2014-12-25 LAB — PRENATAL PROFILE (SOLSTAS)
Antibody Screen: NEGATIVE
Basophils Absolute: 0 10*3/uL (ref 0.0–0.1)
Basophils Relative: 0 % (ref 0–1)
Eosinophils Absolute: 0.1 10*3/uL (ref 0.0–0.7)
Eosinophils Relative: 1 % (ref 0–5)
HCT: 35.9 % — ABNORMAL LOW (ref 36.0–46.0)
HIV 1&2 Ab, 4th Generation: NONREACTIVE
Hemoglobin: 11.9 g/dL — ABNORMAL LOW (ref 12.0–15.0)
Hepatitis B Surface Ag: NEGATIVE
Lymphocytes Relative: 25 % (ref 12–46)
Lymphs Abs: 2.6 10*3/uL (ref 0.7–4.0)
MCH: 28.7 pg (ref 26.0–34.0)
MCHC: 33.1 g/dL (ref 30.0–36.0)
MCV: 86.7 fL (ref 78.0–100.0)
MPV: 9.7 fL (ref 8.6–12.4)
Monocytes Absolute: 0.8 10*3/uL (ref 0.1–1.0)
Monocytes Relative: 8 % (ref 3–12)
Neutro Abs: 6.9 10*3/uL (ref 1.7–7.7)
Neutrophils Relative %: 66 % (ref 43–77)
Platelets: 322 10*3/uL (ref 150–400)
RBC: 4.14 MIL/uL (ref 3.87–5.11)
RDW: 13.5 % (ref 11.5–15.5)
Rh Type: POSITIVE
Rubella: 1.48 Index — ABNORMAL HIGH (ref <0.90)
WBC: 10.5 10*3/uL (ref 4.0–10.5)

## 2014-12-25 LAB — GLUCOSE TOLERANCE, 1 HOUR (50G) W/O FASTING: Glucose, 1 Hour GTT: 127 mg/dL (ref 70–140)

## 2014-12-26 ENCOUNTER — Encounter (HOSPITAL_COMMUNITY): Payer: Self-pay | Admitting: *Deleted

## 2014-12-26 ENCOUNTER — Inpatient Hospital Stay (HOSPITAL_COMMUNITY): Payer: Medicaid Other

## 2014-12-26 ENCOUNTER — Inpatient Hospital Stay (HOSPITAL_COMMUNITY)
Admission: AD | Admit: 2014-12-26 | Discharge: 2014-12-27 | Disposition: A | Discharge status: Home / Self Care | Payer: Medicaid Other | Source: Ambulatory Visit | Attending: Obstetrics and Gynecology | Admitting: Obstetrics and Gynecology

## 2014-12-26 DIAGNOSIS — Z3A19 19 weeks gestation of pregnancy: Secondary | ICD-10-CM | POA: Insufficient documentation

## 2014-12-26 DIAGNOSIS — N83209 Unspecified ovarian cyst, unspecified side: Secondary | ICD-10-CM

## 2014-12-26 DIAGNOSIS — R109 Unspecified abdominal pain: Secondary | ICD-10-CM | POA: Diagnosis not present

## 2014-12-26 DIAGNOSIS — O26899 Other specified pregnancy related conditions, unspecified trimester: Secondary | ICD-10-CM

## 2014-12-26 DIAGNOSIS — Z87891 Personal history of nicotine dependence: Secondary | ICD-10-CM | POA: Diagnosis not present

## 2014-12-26 DIAGNOSIS — N8320 Unspecified ovarian cysts: Secondary | ICD-10-CM

## 2014-12-26 DIAGNOSIS — O9989 Other specified diseases and conditions complicating pregnancy, childbirth and the puerperium: Secondary | ICD-10-CM | POA: Diagnosis not present

## 2014-12-26 DIAGNOSIS — O3482 Maternal care for other abnormalities of pelvic organs, second trimester: Secondary | ICD-10-CM | POA: Diagnosis not present

## 2014-12-26 LAB — CBC
HCT: 33.2 % — ABNORMAL LOW (ref 36.0–46.0)
Hemoglobin: 11.1 g/dL — ABNORMAL LOW (ref 12.0–15.0)
MCH: 29 pg (ref 26.0–34.0)
MCHC: 33.4 g/dL (ref 30.0–36.0)
MCV: 86.7 fL (ref 78.0–100.0)
Platelets: 284 10*3/uL (ref 150–400)
RBC: 3.83 MIL/uL — ABNORMAL LOW (ref 3.87–5.11)
RDW: 13.4 % (ref 11.5–15.5)
WBC: 11.8 10*3/uL — ABNORMAL HIGH (ref 4.0–10.5)

## 2014-12-26 LAB — COMPREHENSIVE METABOLIC PANEL
ALT: 22 U/L (ref 14–54)
AST: 18 U/L (ref 15–41)
Albumin: 3.4 g/dL — ABNORMAL LOW (ref 3.5–5.0)
Alkaline Phosphatase: 67 U/L (ref 38–126)
Anion gap: 7 (ref 5–15)
BUN: 10 mg/dL (ref 6–20)
CO2: 24 mmol/L (ref 22–32)
Calcium: 9.2 mg/dL (ref 8.9–10.3)
Chloride: 105 mmol/L (ref 101–111)
Creatinine, Ser: 0.64 mg/dL (ref 0.44–1.00)
GFR calc Af Amer: >60 mL/min (ref >60)
GFR calc non Af Amer: >60 mL/min (ref >60)
Glucose, Bld: 109 mg/dL — ABNORMAL HIGH (ref 65–99)
Potassium: 3.7 mmol/L (ref 3.5–5.1)
Sodium: 136 mmol/L (ref 135–145)
Total Bilirubin: 0.2 mg/dL — ABNORMAL LOW (ref 0.3–1.2)
Total Protein: 6.9 g/dL (ref 6.5–8.1)

## 2014-12-26 LAB — URINALYSIS, ROUTINE W REFLEX MICROSCOPIC
Bilirubin Urine: NEGATIVE
Glucose, UA: NEGATIVE mg/dL
Hgb urine dipstick: NEGATIVE
Ketones, ur: NEGATIVE mg/dL
Leukocytes, UA: NEGATIVE
Nitrite: NEGATIVE
Protein, ur: NEGATIVE mg/dL
Specific Gravity, Urine: 1.025 (ref 1.005–1.030)
Urobilinogen, UA: 0.2 mg/dL (ref 0.0–1.0)
pH: 6 (ref 5.0–8.0)

## 2014-12-26 LAB — AMYLASE: Amylase: 43 U/L (ref 28–100)

## 2014-12-26 LAB — LIPASE, BLOOD: Lipase: 25 U/L (ref 22–51)

## 2014-12-26 MED ORDER — OXYCODONE-ACETAMINOPHEN 5-325 MG PO TABS: 1.0000 | ORAL_TABLET | Freq: Four times a day (QID) | ORAL | Status: DC | PRN

## 2014-12-26 MED ORDER — OXYCODONE-ACETAMINOPHEN 5-325 MG PO TABS
2.0000 | ORAL_TABLET | ORAL | Status: AC
Start: 2014-12-26 — End: 2014-12-26
  Administered 2014-12-26: 2 via ORAL
  Filled 2014-12-26: qty 2

## 2014-12-26 NOTE — Discharge Instructions (Signed)
Abdominal Pain During Pregnancy Abdominal pain is common in pregnancy. Most of the time, it does not cause harm. There are many causes of abdominal pain. Some causes are more serious than others. Some of the causes of abdominal pain in pregnancy are easily diagnosed. Occasionally, the diagnosis takes time to understand. Other times, the cause is not determined. Abdominal pain can be a sign that something is very wrong with the pregnancy, or the pain may have nothing to do with the pregnancy at all. For this reason, always tell your health care provider if you have any abdominal discomfort. HOME CARE INSTRUCTIONS  Monitor your abdominal pain for any changes. The following actions may help to alleviate any discomfort you are experiencing:  Do not have sexual intercourse or put anything in your vagina until your symptoms go away completely.  Get plenty of rest until your pain improves.  Drink clear fluids if you feel nauseous. Avoid solid food as long as you are uncomfortable or nauseous.  Only take over-the-counter or prescription medicine as directed by your health care provider.  Keep all follow-up appointments with your health care provider. SEEK IMMEDIATE MEDICAL CARE IF:  You are bleeding, leaking fluid, or passing tissue from the vagina.  You have increasing pain or cramping.  You have persistent vomiting.  You have painful or bloody urination.  You have a fever.  You notice a decrease in your baby's movements.  You have extreme weakness or feel faint.  You have shortness of breath, with or without abdominal pain.  You develop a severe headache with abdominal pain.  You have abnormal vaginal discharge with abdominal pain.  You have persistent diarrhea.  You have abdominal pain that continues even after rest, or gets worse. MAKE SURE YOU:   Understand these instructions.  Will watch your condition.  Will get help right away if you are not doing well or get  worse. Document Released: 03/20/2005 Document Revised: 01/08/2013 Document Reviewed: 10/17/2012 [facility] Patient Information 2015 [place], [place]. This information is not intended to replace advice given to you by your health care provider. Make sure you discuss any questions you have with your health care provider.  Hernia A hernia occurs when an internal organ pushes out through a weak spot in the abdominal wall. Hernias most commonly occur in the groin and around the navel. Hernias often can be pushed back into place (reduced). Most hernias tend to get worse over time. Some abdominal hernias can get stuck in the opening (irreducible or incarcerated hernia) and cannot be reduced. An irreducible abdominal hernia which is tightly squeezed into the opening is at risk for impaired blood supply (strangulated hernia). A strangulated hernia is a medical emergency. Because of the risk for an irreducible or strangulated hernia, surgery may be recommended to repair a hernia. CAUSES   Heavy lifting.  Prolonged coughing.  Straining to have a bowel movement.  A cut (incision) made during an abdominal surgery. HOME CARE INSTRUCTIONS   Bed rest is not required. You may continue your normal activities.  Avoid lifting more than 10 pounds (4.5 kg) or straining.  Cough gently. If you are a smoker it is best to stop. Even the best hernia repair can break down with the continual strain of coughing. Even if you do not have your hernia repaired, a cough will continue to aggravate the problem.  Do not wear anything tight over your hernia. Do not try to keep it in with an outside bandage or truss. These can  damage abdominal contents if they are trapped within the hernia sac.  Eat a normal diet.  Avoid constipation. Straining over long periods of time will increase hernia size and encourage breakdown of repairs. If you cannot do this with diet alone, stool softeners may be used. SEEK IMMEDIATE MEDICAL CARE IF:    You have a fever.  You develop increasing abdominal pain.  You feel nauseous or vomit.  Your hernia is stuck outside the abdomen, looks discolored, feels hard, or is tender.  You have any changes in your bowel habits or in the hernia that are unusual for you.  You have increased pain or swelling around the hernia.  You cannot push the hernia back in place by applying gentle pressure while lying down. MAKE SURE YOU:   Understand these instructions.  Will watch your condition.  Will get help right away if you are not doing well or get worse. Document Released: 03/20/2005 Document Revised: 06/12/2011 Document Reviewed: 11/07/2007 [facility] Patient Information 2015 [place], [place]. This information is not intended to replace advice given to you by your health care provider. Make sure you discuss any questions you have with your health care provider.

## 2014-12-26 NOTE — MAU Note (Signed)
Pt c/o sharp umbilical  Pain that started about 5 hours. PT also c/o shortness of breath. Like tightness when she breaths in for the last hour or 2. Has had head cold and sneezing for several days.

## 2014-12-26 NOTE — MAU Provider Note (Signed)
Chief Complaint: Abdominal Pain and Shortness of Breath   First Provider Initiated Contact with Patient 12/26/14 2204      SUBJECTIVE HPI: Veronica Obrien is a 34 y.o. G2P1001 at [redacted]w[redacted]d by LMP who presents to maternity admissions reporting pain at her umbilicus that has been severe off and on today starting around noon.  She has been at work and reports the pain is worse when standing still or sitting, improved when walking. The pain radiates across the right side of her mid abdomen.  She reports it hurts when she takes a deep breath. She also reports a current sinus infection causing some mild SOB when moving around.   She denies cramping/contractions, vaginal bleeding, vaginal itching/burning, urinary symptoms, h/a, dizziness, n/v, or fever/chills.     Abdominal Pain This is a new problem. The current episode started today. The onset quality is sudden. The problem occurs intermittently. The problem has been gradually improving. The pain is located in the periumbilical region. The pain is severe. The quality of the pain is sharp and aching. The abdominal pain radiates to the RLQ. Pertinent negatives include no constipation, diarrhea, dysuria, fever, frequency, headaches, nausea or vomiting. The pain is aggravated by certain positions. The pain is relieved by certain positions. She has tried nothing for the symptoms.  Shortness of Breath This is a new problem. The current episode started in the past 7 days. The problem occurs intermittently. The problem has been gradually improving. Associated symptoms include abdominal pain. Pertinent negatives include no chest pain, fever, headaches, neck pain or vomiting. She has tried nothing for the symptoms.    Past Medical History  Diagnosis Date  . Medical history non-contributory    Past Surgical History  Procedure Laterality Date  . Wisdom tooth extraction     Social History   Social History  . Marital Status: Single    Spouse Name: N/A  . Number  of Children: N/A  . Years of Education: N/A   Occupational History  . Not on file.   Social History Main Topics  . Smoking status: Former Smoker -- 0.00 packs/day    Types: Cigarettes    Quit date: 06/06/2014  . Smokeless tobacco: Never Used  . Alcohol Use: No     Comment: NOT SINCE PREGNANCY  . Drug Use: No  . Sexual Activity: Not Currently   Other Topics Concern  . Not on file   Social History Narrative   No current facility-administered medications on file prior to encounter.   Current Outpatient Prescriptions on File Prior to Encounter  Medication Sig Dispense Refill  . Prenatal Vit-Fe Fumarate-FA (PRENATAL MULTIVITAMIN) TABS tablet Take 1 tablet by mouth daily.      Allergies  Allergen Reactions  . Novocain [Procaine Hcl] Other (See Comments)    Pt states that it makes her try to bite her tongue.      ROS:  Review of Systems  Constitutional: Negative for fever, chills and fatigue.  HENT: Negative for sinus pressure.   Eyes: Negative for photophobia.  Respiratory: Positive for shortness of breath.   Cardiovascular: Negative for chest pain.  Gastrointestinal: Positive for abdominal pain. Negative for nausea, vomiting, diarrhea and constipation.  Genitourinary: Negative for dysuria, frequency, flank pain, vaginal bleeding, vaginal discharge, difficulty urinating, vaginal pain and pelvic pain.  Musculoskeletal: Negative for neck pain.  Neurological: Negative for dizziness, weakness and headaches.  Psychiatric/Behavioral: Negative.      I have reviewed patient's Past Medical Hx, Surgical Hx, Family Hx, Social  Hx, medications and allergies.   Physical Exam   Patient Vitals for the past 24 hrs:  BP Temp Pulse Resp SpO2 Height Weight  12/26/14 2123 122/66 mmHg 98.5 F (36.9 C) 91 18 100 % 5\' 7"  (1.702 m) 95.255 kg (210 lb)   Constitutional: Well-developed, well-nourished female in no acute distress.  Cardiovascular: normal rate Respiratory: normal effort GI:  Abd soft, significant tenderness at umbilicus and above, mild RLQ tenderness, no rebound tenderness or guarding, no clear evidence of hernia with use of abdominal muscles. Pos BS x 4 MS: Extremities nontender, no edema, normal ROM Neurologic: Alert and oriented x 4.  GU: Neg CVAT.  FHT 148 by doppler  LAB RESULTS Results for orders placed or performed during the hospital encounter of 12/26/14 (from the past 24 hour(s))  Urinalysis, Routine w reflex microscopic (not at [facility])     Status: None   Collection Time: 12/26/14  9:05 PM  Result Value Ref Range   Color, Urine YELLOW YELLOW   APPearance CLEAR CLEAR   Specific Gravity, Urine 1.025 1.005 - 1.030   pH 6.0 5.0 - 8.0   Glucose, UA NEGATIVE NEGATIVE mg/dL   Hgb urine dipstick NEGATIVE NEGATIVE   Bilirubin Urine NEGATIVE NEGATIVE   Ketones, ur NEGATIVE NEGATIVE mg/dL   Protein, ur NEGATIVE NEGATIVE mg/dL   Urobilinogen, UA 0.2 0.0 - 1.0 mg/dL   Nitrite NEGATIVE NEGATIVE   Leukocytes, UA NEGATIVE NEGATIVE  CBC     Status: Abnormal   Collection Time: 12/26/14 11:00 PM  Result Value Ref Range   WBC 11.8 (H) 4.0 - 10.5 K/uL   RBC 3.83 (L) 3.87 - 5.11 MIL/uL   Hemoglobin 11.1 (L) 12.0 - 15.0 g/dL   HCT 33.2 (L) 36.0 - 46.0 %   MCV 86.7 78.0 - 100.0 fL   MCH 29.0 26.0 - 34.0 pg   MCHC 33.4 30.0 - 36.0 g/dL   RDW 13.4 11.5 - 15.5 %   Platelets 284 150 - 400 K/uL    O/POS/-- (09/22 1008)  IMAGING  Preliminary report with normal AFI, normal FHR, cervical length 3.8 cm, low lying placenta, stable right ovarian cyst  MAU Management/MDM: Ordered labs and imaging and reviewed results.  Treatments in MAU included Percocet 5/325 x 2 tabs with pt report of improved pain. Pt stable at time of discharge.  ASSESSMENT 1. Ovarian cyst in pregnancy, second trimester   2. Abdominal pain affecting pregnancy     PLAN Discharge home Percocet 5/325, take 1-2 tabs Q 6 hours PRN x 10 tabs    Medication List    TAKE these medications         oxyCODONE-acetaminophen 5-325 MG per tablet  Commonly known as:  PERCOCET/ROXICET  Take 1-2 tablets by mouth every 6 (six) hours as needed for severe pain.     prenatal multivitamin Tabs tablet  Take 1 tablet by mouth daily.       Follow-up Information    Follow up with [facility].   Specialty:  Obstetrics and Gynecology   Why:  As scheduled   Contact information:   [place] [phone]      Follow up with [facility].   Why:  As needed for emergencies   Contact information:   [address] [id number] [place] Certified Nurse-Midwife 12/26/2014  11:24 PM

## 2014-12-28 LAB — CYTOLOGY - PAP

## 2014-12-30 LAB — AFP, QUAD SCREEN
AFP: 30.2 ng/mL
Age Alone: 1:463 {titer}
Curr Gest Age: 18.6 wks.days
Down Syndrome Scr Risk Est: 1:8820 {titer}
HCG, Total: 13.11 IU/mL
INH: 94.2 pg/mL
Interpretation-AFP: NEGATIVE
MoM for AFP: 0.77
MoM for INH: 0.64
MoM for hCG: 0.64
Open Spina bifida: NEGATIVE
Osb Risk: <1:27300 {titer}
Tri 18 Scr Risk Est: NEGATIVE
Trisomy 18 (Edward) Syndrome Interp.: 1:15200 {titer}
uE3 Mom: 1.18
uE3 Value: 1.69 ng/mL

## 2015-01-04 ENCOUNTER — Ambulatory Visit (HOSPITAL_COMMUNITY)
Admission: RE | Admit: 2015-01-04 | Discharge: 2015-01-04 | Disposition: A | Discharge status: Home / Self Care | Payer: Medicaid Other | Source: Ambulatory Visit | Attending: Obstetrics and Gynecology | Admitting: Obstetrics and Gynecology

## 2015-01-04 DIAGNOSIS — O4402 Placenta previa specified as without hemorrhage, second trimester: Secondary | ICD-10-CM

## 2015-01-04 DIAGNOSIS — O4412 Placenta previa with hemorrhage, second trimester: Secondary | ICD-10-CM | POA: Insufficient documentation

## 2015-01-04 DIAGNOSIS — O0992 Supervision of high risk pregnancy, unspecified, second trimester: Secondary | ICD-10-CM

## 2015-01-21 ENCOUNTER — Ambulatory Visit (INDEPENDENT_AMBULATORY_CARE_PROVIDER_SITE_OTHER): Payer: Medicaid Other | Admitting: Family Medicine

## 2015-01-21 VITALS — BP 113/57 | HR 83 | Temp 98.4°F | Wt 204.7 lb

## 2015-01-21 DIAGNOSIS — E669 Obesity, unspecified: Secondary | ICD-10-CM

## 2015-01-21 DIAGNOSIS — O4440 Low lying placenta NOS or without hemorrhage, unspecified trimester: Secondary | ICD-10-CM

## 2015-01-21 DIAGNOSIS — O0992 Supervision of high risk pregnancy, unspecified, second trimester: Secondary | ICD-10-CM

## 2015-01-21 DIAGNOSIS — O99212 Obesity complicating pregnancy, second trimester: Secondary | ICD-10-CM

## 2015-01-21 LAB — POCT URINALYSIS DIP (DEVICE)
Bilirubin Urine: NEGATIVE
Glucose, UA: NEGATIVE mg/dL
Hgb urine dipstick: NEGATIVE
Ketones, ur: NEGATIVE mg/dL
Nitrite: NEGATIVE
Protein, ur: NEGATIVE mg/dL
Specific Gravity, Urine: 1.02 (ref 1.005–1.030)
Urobilinogen, UA: 0.2 mg/dL (ref 0.0–1.0)
pH: 6.5 (ref 5.0–8.0)

## 2015-01-21 NOTE — Progress Notes (Signed)
Subjective:  Veronica Obrien is a 34 y.o. G2P1001 at [redacted]w[redacted]d being seen today for ongoing prenatal care.  Patient reports no complaints.  Contractions: Not present.  Vag. Bleeding: None. Movement: Present. Denies leaking of fluid.   The following portions of the patient's history were reviewed and updated as appropriate: allergies, current medications, past family history, past medical history, past social history, past surgical history and problem list. Problem list updated.  Objective:   Filed Vitals:   01/21/15 1123  BP: 113/57  Pulse: 83  Temp: 98.4 F (36.9 C)  Weight: 204 lb 11.2 oz (92.851 kg)    Fetal Status: Fetal Heart Rate (bpm): 141   Movement: Present     General:  Alert, oriented and cooperative. Patient is in no acute distress.  Skin: Skin is warm and dry. No rash noted.   Cardiovascular: Normal heart rate noted  Respiratory: Normal respiratory effort, no problems with respiration noted  Abdomen: Soft, gravid, appropriate for gestational age. Pain/Pressure: Present     Pelvic: Vag. Bleeding: None     Cervical exam deferred        Extremities: Normal range of motion.  Edema: None  Mental Status: Normal mood and affect. Normal behavior. Normal judgment and thought content.   Urinalysis:      Assessment and Plan:  Pregnancy: G2P1001 at [redacted]w[redacted]d  1. Supervision of high risk pregnancy, antepartum, second trimester Normal FHT  2. Obesity (BMI 30-39.9)   3. Low lying placenta, antepartum resolved  Preterm labor symptoms and general obstetric precautions including but not limited to vaginal bleeding, contractions, leaking of fluid and fetal movement were reviewed in detail with the patient. Please refer to After Visit Summary for other counseling recommendations.  No Follow-up on file.   Truett Mainland, DO

## 2015-01-23 ENCOUNTER — Emergency Department (HOSPITAL_COMMUNITY)
Admission: EM | Admit: 2015-01-23 | Discharge: 2015-01-24 | Disposition: A | Discharge status: Home / Self Care | Payer: Worker's Compensation | Attending: Emergency Medicine | Admitting: Emergency Medicine

## 2015-01-23 ENCOUNTER — Emergency Department (HOSPITAL_COMMUNITY): Payer: Worker's Compensation

## 2015-01-23 ENCOUNTER — Encounter (HOSPITAL_COMMUNITY): Payer: Self-pay | Admitting: Oncology

## 2015-01-23 DIAGNOSIS — S93401A Sprain of unspecified ligament of right ankle, initial encounter: Secondary | ICD-10-CM | POA: Diagnosis not present

## 2015-01-23 DIAGNOSIS — S8001XA Contusion of right knee, initial encounter: Secondary | ICD-10-CM | POA: Diagnosis not present

## 2015-01-23 DIAGNOSIS — Y9389 Activity, other specified: Secondary | ICD-10-CM | POA: Diagnosis not present

## 2015-01-23 DIAGNOSIS — W010XXA Fall on same level from slipping, tripping and stumbling without subsequent striking against object, initial encounter: Secondary | ICD-10-CM | POA: Insufficient documentation

## 2015-01-23 DIAGNOSIS — Z87891 Personal history of nicotine dependence: Secondary | ICD-10-CM | POA: Insufficient documentation

## 2015-01-23 DIAGNOSIS — Y998 Other external cause status: Secondary | ICD-10-CM | POA: Insufficient documentation

## 2015-01-23 DIAGNOSIS — Y9289 Other specified places as the place of occurrence of the external cause: Secondary | ICD-10-CM | POA: Diagnosis not present

## 2015-01-23 DIAGNOSIS — Z79899 Other long term (current) drug therapy: Secondary | ICD-10-CM | POA: Diagnosis not present

## 2015-01-23 DIAGNOSIS — Z3A25 25 weeks gestation of pregnancy: Secondary | ICD-10-CM | POA: Insufficient documentation

## 2015-01-23 DIAGNOSIS — O9A212 Injury, poisoning and certain other consequences of external causes complicating pregnancy, second trimester: Secondary | ICD-10-CM | POA: Insufficient documentation

## 2015-01-23 NOTE — ED Provider Notes (Signed)
CSN: [id number]     Arrival date & time 01/23/15  2250 History   First MD Initiated Contact with Patient 01/23/15 2318     Chief Complaint  Patient presents with  . Knee Pain     (Consider location/radiation/quality/duration/timing/severity/associated sxs/prior Treatment) HPI Comments: 34 year old female, currently 4 weeks' pregnant, presents to the emergency department for further evaluation of right knee, ankle, and foot pain secondary to a fall. Patient states that she slipped on water causing primary impact to her right knee. She states the pain is worse with weightbearing. She has been ambulatory since the event. No medications taken prior to arrival. She denies hitting her head or losing consciousness. No direct trauma to her abdomen. Patient denies any vaginal bleeding or discharge. No sudden gush of fluid from the vaginal canal. Patient had a sharp pain in her right inguinal region which lasted for less than 1 minute. She has not had any other abdominal pain complaints. Patient reports a history of right ankle fracture. She states that she has also had difficulty with her right knee in the past, but denies any broken bones. No extremity numbness or weakness. No dizziness or lightheadedness prior to the fall.  Patient is a 34 y.o. female presenting with knee pain. The history is provided by the patient. No language interpreter was used.  Knee Pain   Past Medical History  Diagnosis Date  . Medical history non-contributory    Past Surgical History  Procedure Laterality Date  . Wisdom tooth extraction     Family History  Problem Relation Age of Onset  . Arthritis Father   . Cancer Sister   . Heart disease Maternal Grandfather   . Diabetes Maternal Grandfather   . Diabetes Paternal Grandmother   . Cancer Paternal Grandmother   . Diabetes Paternal Grandfather    Social History  Substance Use Topics  . Smoking status: Former Smoker -- 0.00 packs/day    Types: Cigarettes   Quit date: 06/06/2014  . Smokeless tobacco: Never Used  . Alcohol Use: No     Comment: NOT SINCE PREGNANCY   OB History    Gravida Para Term Preterm AB TAB SAB Ectopic Multiple Living   2 1 1  0 0 0 0 0 0 1      Review of Systems  Gastrointestinal: Negative for vomiting.  Genitourinary: Negative for vaginal bleeding and vaginal discharge.  Musculoskeletal: Positive for myalgias and arthralgias.  Neurological: Negative for dizziness and syncope.  All other systems reviewed and are negative.   Allergies  Novocain  Home Medications   Prior to Admission medications   Medication Sig Start Date End Date Taking? Authorizing Provider  acetaminophen (TYLENOL) 500 MG tablet Take 1 tablet (500 mg total) by mouth every 6 (six) hours as needed. 01/24/15   Antonietta Breach, PA-C  oxyCODONE-acetaminophen (PERCOCET/ROXICET) 5-325 MG per tablet Take 1-2 tablets by mouth every 6 (six) hours as needed for severe pain. Patient not taking: Reported on 01/21/2015 12/26/14   Elvera Maria, CNM  Prenatal Vit-Fe Fumarate-FA (PRENATAL MULTIVITAMIN) TABS tablet Take 1 tablet by mouth daily.     Historical Provider, MD   BP 109/64 mmHg  Pulse 72  Temp(Src) 97.6 F (36.4 C) (Oral)  Resp 18  Ht 5\' 8"  (1.727 m)  Wt 204 lb (92.534 kg)  BMI 31.03 kg/m2  SpO2 99%  LMP 08/14/2014   Physical Exam  Constitutional: She is oriented to person, place, and time. She appears well-developed and well-nourished. No distress.  HENT:  Head: Normocephalic and atraumatic.  Eyes: Conjunctivae and EOM are normal. No scleral icterus.  Neck: Normal range of motion.  Cardiovascular: Normal rate, regular rhythm and intact distal pulses.   DP and PT pulses 2+ bilaterally.  Pulmonary/Chest: Effort normal. No respiratory distress.  Respirations even and unlabored  Musculoskeletal: Normal range of motion.       Right knee: She exhibits bony tenderness (patella). She exhibits normal range of motion, no swelling, no  effusion, no deformity, no erythema, normal alignment, no LCL laxity and no MCL laxity.       Right ankle: She exhibits normal range of motion, no swelling, no deformity and normal pulse. Tenderness. Lateral malleolus (anterior) tenderness found. Achilles tendon normal.       Legs:      Right foot: There is tenderness. There is normal range of motion, no bony tenderness, no swelling and normal capillary refill.       Feet:  Neurological: She is alert and oriented to person, place, and time. She exhibits normal muscle tone. Coordination normal.  GCS 15. Sensation to light touch intact. Patellar and Achilles reflexes intact in the right lower extremity  Skin: Skin is warm and dry. No rash noted. She is not diaphoretic. No erythema. No pallor.  Psychiatric: She has a normal mood and affect. Her behavior is normal.  Nursing note and vitals reviewed.   ED Course  Procedures (including critical care time) Labs Review Labs Reviewed - No data to display  Imaging Review Dg Knee Complete 4 Views Right  01/24/2015  CLINICAL DATA:  Fall at work this evening landing on her knees. Tender to palpation over patella. EXAM: RIGHT KNEE - COMPLETE 4+ VIEW COMPARISON:  None. FINDINGS: No fracture or dislocation. The alignment and joint spaces are maintained. The patella appears intact. No joint effusion. No focal soft tissue abnormality. IMPRESSION: No fracture or dislocation of the right knee. Electronically Signed   By: Jeb Levering M.D.   On: 01/24/2015 00:54     I have personally reviewed and evaluated these images and lab results as part of my medical decision-making.   EKG Interpretation None      MDM   Final diagnoses:  Knee contusion, right, initial encounter  Ankle sprain, right, initial encounter    34 year old female presents to the emergency department for further evaluation of symptoms following a fall. Patient complaining primarily of right knee and ankle pain. Patient reports  being ambulatory since the event. She is neurovascularly intact today. No bony deformity or crepitus. X-ray of knee negative for fracture or dislocation. Patient is currently pregnant, 25 weeks. She has normal fetal heart tones and denies new trauma to her abdomen. No vaginal bleeding, discharge, or persistent abdominal pain. No indication for further emergent workup at this time. Will discharge with supportive care instructions and recommendations for primary care follow-up. Return precautions given at discharge. Patient agreeable to plan with no unaddressed concerns. Patient discharged in good condition.   Filed Vitals:   01/23/15 2258  BP: 109/64  Pulse: 72  Temp: 97.6 F (36.4 C)  TempSrc: Oral  Resp: 18  Height: 5\' 8"  (1.727 m)  Weight: 204 lb (92.534 kg)  SpO2: 99%     Antonietta Breach, PA-C 01/24/15 9628  Merryl Hacker, MD 01/24/15 [phone]

## 2015-01-23 NOTE — ED Notes (Signed)
While at work pt slipped in water and fell onto her right knee, ankle, foot and left arm.  Pt states she has a "weird" feeling in her left pinky.  Denies hitting head/LOC.  Denies dizziness prior to fall.

## 2015-01-24 ENCOUNTER — Emergency Department (HOSPITAL_COMMUNITY): Payer: Worker's Compensation

## 2015-01-24 MED ORDER — ACETAMINOPHEN 500 MG PO TABS: 500.0000 mg | ORAL_TABLET | Freq: Four times a day (QID) | ORAL | Status: DC | PRN

## 2015-01-24 NOTE — ED Notes (Signed)
Fetal HR = 142.

## 2015-01-24 NOTE — Discharge Instructions (Signed)

## 2015-02-14 ENCOUNTER — Inpatient Hospital Stay (HOSPITAL_COMMUNITY): Payer: Medicaid Other

## 2015-02-14 ENCOUNTER — Inpatient Hospital Stay (HOSPITAL_COMMUNITY)
Admission: AD | Admit: 2015-02-14 | Discharge: 2015-02-21 | DRG: 782 | Disposition: A | Discharge status: Home / Self Care | Payer: Medicaid Other | Source: Ambulatory Visit | Attending: Obstetrics and Gynecology | Admitting: Obstetrics and Gynecology

## 2015-02-14 ENCOUNTER — Encounter (HOSPITAL_COMMUNITY): Payer: Self-pay | Admitting: *Deleted

## 2015-02-14 DIAGNOSIS — O4592 Premature separation of placenta, unspecified, second trimester: Secondary | ICD-10-CM | POA: Diagnosis present

## 2015-02-14 DIAGNOSIS — O4690 Antepartum hemorrhage, unspecified, unspecified trimester: Secondary | ICD-10-CM

## 2015-02-14 DIAGNOSIS — O0992 Supervision of high risk pregnancy, unspecified, second trimester: Secondary | ICD-10-CM

## 2015-02-14 DIAGNOSIS — O4692 Antepartum hemorrhage, unspecified, second trimester: Secondary | ICD-10-CM | POA: Diagnosis not present

## 2015-02-14 DIAGNOSIS — O26899 Other specified pregnancy related conditions, unspecified trimester: Secondary | ICD-10-CM

## 2015-02-14 DIAGNOSIS — N83209 Unspecified ovarian cyst, unspecified side: Secondary | ICD-10-CM

## 2015-02-14 DIAGNOSIS — R109 Unspecified abdominal pain: Secondary | ICD-10-CM

## 2015-02-14 DIAGNOSIS — Z3A26 26 weeks gestation of pregnancy: Secondary | ICD-10-CM

## 2015-02-14 DIAGNOSIS — O3482 Maternal care for other abnormalities of pelvic organs, second trimester: Secondary | ICD-10-CM

## 2015-02-14 DIAGNOSIS — E669 Obesity, unspecified: Secondary | ICD-10-CM

## 2015-02-14 DIAGNOSIS — O4593 Premature separation of placenta, unspecified, third trimester: Secondary | ICD-10-CM | POA: Diagnosis present

## 2015-02-14 LAB — CBC WITH DIFFERENTIAL/PLATELET
Basophils Absolute: 0 10*3/uL (ref 0.0–0.1)
Basophils Relative: 0 %
Eosinophils Absolute: 0.1 10*3/uL (ref 0.0–0.7)
Eosinophils Relative: 1 %
HCT: 34.7 % — ABNORMAL LOW (ref 36.0–46.0)
Hemoglobin: 11.8 g/dL — ABNORMAL LOW (ref 12.0–15.0)
Lymphocytes Relative: 20 %
Lymphs Abs: 2.1 10*3/uL (ref 0.7–4.0)
MCH: 29.1 pg (ref 26.0–34.0)
MCHC: 34 g/dL (ref 30.0–36.0)
MCV: 85.5 fL (ref 78.0–100.0)
Monocytes Absolute: 0.8 10*3/uL (ref 0.1–1.0)
Monocytes Relative: 8 %
Neutro Abs: 7.6 10*3/uL (ref 1.7–7.7)
Neutrophils Relative %: 71 %
Platelets: 311 10*3/uL (ref 150–400)
RBC: 4.06 MIL/uL (ref 3.87–5.11)
RDW: 13.5 % (ref 11.5–15.5)
WBC: 10.7 10*3/uL — ABNORMAL HIGH (ref 4.0–10.5)

## 2015-02-14 LAB — COMPREHENSIVE METABOLIC PANEL
ALT: 16 U/L (ref 14–54)
AST: 20 U/L (ref 15–41)
Albumin: 3.1 g/dL — ABNORMAL LOW (ref 3.5–5.0)
Alkaline Phosphatase: 89 U/L (ref 38–126)
Anion gap: 8 (ref 5–15)
BUN: 8 mg/dL (ref 6–20)
CO2: 21 mmol/L — ABNORMAL LOW (ref 22–32)
Calcium: 9.5 mg/dL (ref 8.9–10.3)
Chloride: 109 mmol/L (ref 101–111)
Creatinine, Ser: 0.64 mg/dL (ref 0.44–1.00)
GFR calc Af Amer: >60 mL/min (ref >60)
GFR calc non Af Amer: >60 mL/min (ref >60)
Glucose, Bld: 121 mg/dL — ABNORMAL HIGH (ref 65–99)
Potassium: 3.9 mmol/L (ref 3.5–5.1)
Sodium: 138 mmol/L (ref 135–145)
Total Bilirubin: 0.3 mg/dL (ref 0.3–1.2)
Total Protein: 6.5 g/dL (ref 6.5–8.1)

## 2015-02-14 LAB — URINALYSIS, ROUTINE W REFLEX MICROSCOPIC
Bilirubin Urine: NEGATIVE
Glucose, UA: NEGATIVE mg/dL
Hgb urine dipstick: NEGATIVE
Ketones, ur: NEGATIVE mg/dL
Leukocytes, UA: NEGATIVE
Nitrite: NEGATIVE
Protein, ur: NEGATIVE mg/dL
Specific Gravity, Urine: 1.02 (ref 1.005–1.030)
Urobilinogen, UA: 0.2 mg/dL (ref 0.0–1.0)
pH: 6 (ref 5.0–8.0)

## 2015-02-14 LAB — RAPID URINE DRUG SCREEN, HOSP PERFORMED
Amphetamines: NOT DETECTED
Barbiturates: NOT DETECTED
Benzodiazepines: NOT DETECTED
Cocaine: NOT DETECTED
Opiates: NOT DETECTED
Tetrahydrocannabinol: NOT DETECTED

## 2015-02-14 LAB — PREPARE RBC (CROSSMATCH)

## 2015-02-14 MED ORDER — ZOLPIDEM TARTRATE 5 MG PO TABS: 5.0000 mg | ORAL_TABLET | Freq: Every evening | ORAL | Status: DC | PRN

## 2015-02-14 MED ORDER — BETAMETHASONE SOD PHOS & ACET 6 (3-3) MG/ML IJ SUSP
12.0000 mg | Freq: Once | INTRAMUSCULAR | Status: AC
  Administered 2015-02-15: 12 mg via INTRAMUSCULAR
  Filled 2015-02-14: qty 2

## 2015-02-14 MED ORDER — BETAMETHASONE SOD PHOS & ACET 6 (3-3) MG/ML IJ SUSP
12.0000 mg | Freq: Once | INTRAMUSCULAR | Status: AC
  Administered 2015-02-14: 12 mg via INTRAMUSCULAR
  Filled 2015-02-14: qty 2

## 2015-02-14 MED ORDER — CALCIUM CARBONATE ANTACID 500 MG PO CHEW
2.0000 | CHEWABLE_TABLET | ORAL | Status: DC | PRN
  Administered 2015-02-16 – 2015-02-19 (×3): 400 mg via ORAL
  Filled 2015-02-14 (×3): qty 2

## 2015-02-14 MED ORDER — DOCUSATE SODIUM 100 MG PO CAPS
100.0000 mg | ORAL_CAPSULE | Freq: Every day | ORAL | Status: DC
Start: 2015-02-14 — End: 2015-02-21
  Administered 2015-02-15 – 2015-02-21 (×7): 100 mg via ORAL
  Filled 2015-02-14 (×7): qty 1

## 2015-02-14 MED ORDER — ACETAMINOPHEN 325 MG PO TABS
650.0000 mg | ORAL_TABLET | ORAL | Status: DC | PRN
  Administered 2015-02-17: 650 mg via ORAL
  Filled 2015-02-14: qty 2

## 2015-02-14 MED ORDER — LACTATED RINGERS IV SOLN
INTRAVENOUS | Status: DC
  Administered 2015-02-14: 1000 mL via INTRAVENOUS
  Administered 2015-02-14 – 2015-02-15 (×2): via INTRAVENOUS

## 2015-02-14 MED ORDER — PRENATAL MULTIVITAMIN CH
1.0000 | ORAL_TABLET | Freq: Every day | ORAL | Status: DC
  Administered 2015-02-15 – 2015-02-21 (×7): 1 via ORAL
  Filled 2015-02-14 (×7): qty 1

## 2015-02-14 NOTE — Progress Notes (Signed)
RN to bedside, pt assisted to BR. Cat I FHR surveillance prior to removal of EFM.    

## 2015-02-14 NOTE — MAU Note (Signed)
Pt was at work and felt a small amount of vaginal leaking; went into bathroom and bright red blood ran down leg. Called 911 immediately. Denies n/v/d. Had intercourse was last week

## 2015-02-14 NOTE — H&P (Signed)
Veronica Obrien is a 34 y.o. female G2P1 at 26.2 wks  presenting for abd pain pain and vaginal bleeding that started this morning while she was at work.. Maternal Medical History:  Reason for admission: Vaginal bleeding.   Contractions: Onset was 1-2 hours ago.    Fetal activity: Perceived fetal activity is normal.   Last perceived fetal movement was within the past hour.    Prenatal complications: Bleeding and placental abnormality.   Prenatal Complications - Diabetes: none.    OB History    Gravida Para Term Preterm AB TAB SAB Ectopic Multiple Living   2 1 1  0 0 0 0 0 0 1     Past Medical History  Diagnosis Date  . Medical history non-contributory    Past Surgical History  Procedure Laterality Date  . Wisdom tooth extraction     Family History: family history includes Arthritis in her father; Cancer in her paternal grandmother and sister; Diabetes in her maternal grandfather, paternal grandfather, and paternal grandmother; Heart disease in her maternal grandfather. Social History:  reports that she quit smoking about 8 months ago. Her smoking use included Cigarettes. She smoked 0.00 packs per day. She has never used smokeless tobacco. She reports that she does not drink alcohol or use illicit drugs.   Prenatal Transfer Tool  Maternal Diabetes: No Genetic Screening: Normal Maternal Ultrasounds/Referrals: Normal Fetal Ultrasounds or other Referrals:  None Maternal Substance Abuse:  No Significant Maternal Medications:  None Significant Maternal Lab Results:  None Other Comments:  None  Review of Systems  Constitutional: Negative.   HENT: Negative.   Eyes: Negative.   Respiratory: Negative.   Cardiovascular: Negative.   Gastrointestinal: Positive for abdominal pain.  Genitourinary: Negative.        Mod amt bright vag bleeding  Musculoskeletal: Negative.   Skin: Negative.   Neurological: Negative.   Endo/Heme/Allergies: Negative.   Psychiatric/Behavioral: Negative.        Blood pressure 130/73, pulse 88, temperature 98.3 F (36.8 C), resp. rate 18, last menstrual period 08/14/2014. Maternal Exam:  Abdomen: Patient reports no abdominal tenderness. Fundal height is 26.    Introitus: Amniotic fluid character: not assessed. Having mo amt bright vag bleeding on admit  Cervix: not evaluated.   Fetal Exam Fetal Monitor Review: Mode: ultrasound.   Variability: moderate (6-25 bpm).    Fetal State Assessment: Category I - tracings are normal.     Physical Exam  Constitutional: She is oriented to person, place, and time. She appears well-developed and well-nourished.  HENT:  Head: Normocephalic.  Eyes: Pupils are equal, round, and reactive to light.  Neck: Normal range of motion.  Cardiovascular: Normal rate, regular rhythm, normal heart sounds and intact distal pulses.   Respiratory: Effort normal and breath sounds normal.  GI: Soft. Bowel sounds are normal.  Genitourinary: Vagina normal and uterus normal.  Musculoskeletal: Normal range of motion.  Neurological: She is alert and oriented to person, place, and time. She has normal reflexes.  Skin: Skin is warm and dry.  Psychiatric: She has a normal mood and affect. Her behavior is normal. Judgment and thought content normal.    Prenatal labs: ABO, Rh: O/POS/-- (09/22 1008) Antibody: NEG (09/22 1008) Rubella: 1.48 (09/22 1008) RPR: NON REAC (09/22 1008)  HBsAg: NEGATIVE (09/22 1008)  HIV: NONREACTIVE (09/22 1008)  GBS:     Assessment/Plan: Admit, steroids, iv access. Crossmatch 2 units. Dr. Elonda Husky in and pt evaluated.   Koren Shiver DARLENE 02/14/2015, 11:33 AM

## 2015-02-14 NOTE — MAU Note (Signed)
Pt felt a sharp pain on left lower side then had a gush of blood, still bleeding running down her legs.  No cramping.

## 2015-02-14 NOTE — MAU Note (Signed)
Report from EMS, pt at work- sudden onset of pain and bleeding.  Hx of previa, ? Resolved.  Fell 2 days ago, twisted ankle- was not evaluated

## 2015-02-14 NOTE — Progress Notes (Signed)
Continuous FHR surveillance interrupted with maternal position change. RN to bedside, EFM adjusted.
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
## 2015-02-15 MED ORDER — SODIUM CHLORIDE 0.9 % IJ SOLN: 3.0000 mL | INTRAMUSCULAR | Status: DC | PRN

## 2015-02-15 MED ORDER — SODIUM CHLORIDE 0.9 % IJ SOLN: 3.0000 mL | Freq: Two times a day (BID) | INTRAMUSCULAR | Status: DC

## 2015-02-15 MED ORDER — SODIUM CHLORIDE 0.9 % IJ SOLN
3.0000 mL | Freq: Two times a day (BID) | INTRAMUSCULAR | Status: DC
  Administered 2015-02-15 – 2015-02-18 (×8): 3 mL via INTRAVENOUS

## 2015-02-15 MED ORDER — SODIUM CHLORIDE 0.9 % IV SOLN: 250.0000 mL | INTRAVENOUS | Status: DC | PRN

## 2015-02-15 NOTE — Progress Notes (Signed)
Patient ID: Veronica Obrien, female   DOB: 1980/05/14, 33 y.o.   MRN: [id number] [place]) NOTE  Veronica Obrien is a 34 y.o. G2P1001 with Estimated Date of Delivery: 05/21/15   By  early ultrasound, midtrimester ultrasound [redacted]w[redacted]d  who is admitted for marginal placental abruption.    Fetal presentation is cephalic. Length of Stay:  1  Days  Date of admission:02/14/2015  Subjective: No active bleeding small amount of brown staining when she wipes Patient reports the fetal movement as active. Patient reports uterine contraction  activity as none. Patient reports  vaginal bleeding as scant staining. Patient describes fluid per vagina as None.  Vitals:  Blood pressure 102/63, pulse 77, temperature 97.9 F (36.6 C), temperature source Oral, resp. rate 16, height 5\' 8"  (1.727 m), weight 214 lb (97.07 kg), last menstrual period 08/14/2014, SpO2 100 %. Filed Vitals:   02/14/15 1656 02/14/15 2055 02/14/15 2225 02/15/15 0625  BP: 109/58 98/85 103/59 102/63  Pulse: 87 87 79 77  Temp: 97.6 F (36.4 C) 98.1 F (36.7 C) 98 F (36.7 C) 97.9 F (36.6 C)  TempSrc: Oral Oral Oral Oral  Resp: 20 18 18 16   Height:      Weight:      SpO2:  97% 96% 100%   Physical Examination:  General appearance - alert, well appearing, and in no distress Abdomen - soft benign Fundal Height:  size equals dates  Extremities: extremities normal, atraumatic, no cyanosis or edema with DTRs 2+ bilaterally Membranes:intact  Fetal Monitoring:     Reassuring for gestational age  Labs:  Results for orders placed or performed during the hospital encounter of 02/14/15 (from the past 24 hour(s))  CBC with Differential   Collection Time: 02/14/15 11:30 AM  Result Value Ref Range   WBC 10.7 (H) 4.0 - 10.5 K/uL   RBC 4.06 3.87 - 5.11 MIL/uL   Hemoglobin 11.8 (L) 12.0 - 15.0 g/dL   HCT 34.7 (L) 36.0 - 46.0 %   MCV 85.5 78.0 - 100.0 fL   MCH 29.1 26.0 - 34.0 pg   MCHC 34.0 30.0 - 36.0 g/dL   RDW 13.5 11.5 - 15.5 %   Platelets 311 150 - 400 K/uL   Neutrophils Relative % 71 %   Neutro Abs 7.6 1.7 - 7.7 K/uL   Lymphocytes Relative 20 %   Lymphs Abs 2.1 0.7 - 4.0 K/uL   Monocytes Relative 8 %   Monocytes Absolute 0.8 0.1 - 1.0 K/uL   Eosinophils Relative 1 %   Eosinophils Absolute 0.1 0.0 - 0.7 K/uL   Basophils Relative 0 %   Basophils Absolute 0.0 0.0 - 0.1 K/uL  Comprehensive metabolic panel   Collection Time: 02/14/15 11:30 AM  Result Value Ref Range   Sodium 138 135 - 145 mmol/L   Potassium 3.9 3.5 - 5.1 mmol/L   Chloride 109 101 - 111 mmol/L   CO2 21 (L) 22 - 32 mmol/L   Glucose, Bld 121 (H) 65 - 99 mg/dL   BUN 8 6 - 20 mg/dL   Creatinine, Ser 0.64 0.44 - 1.00 mg/dL   Calcium 9.5 8.9 - 10.3 mg/dL   Total Protein 6.5 6.5 - 8.1 g/dL   Albumin 3.1 (L) 3.5 - 5.0 g/dL   AST 20 15 - 41 U/L   ALT 16 14 - 54 U/L   Alkaline Phosphatase 89 38 - 126 U/L   Total Bilirubin 0.3 0.3 - 1.2 mg/dL   GFR calc non Af  Amer >60 >60 mL/min   GFR calc Af Amer >60 >60 mL/min   Anion gap 8 5 - 15  Type and screen [place]   Collection Time: 02/14/15 11:30 AM  Result Value Ref Range   ABO/RH(D) O POS    Antibody Screen NEG    Sample Expiration 02/17/2015    Unit Number [id number]    Blood Component Type RED CELLS,LR    Unit division 00    Status of Unit ALLOCATED    Transfusion Status OK TO TRANSFUSE    Crossmatch Result Compatible    Unit Number [id number]    Blood Component Type RED CELLS,LR    Unit division 00    Status of Unit ALLOCATED    Transfusion Status OK TO TRANSFUSE    Crossmatch Result Compatible   Prepare RBC (crossmatch)   Collection Time: 02/14/15 11:30 AM  Result Value Ref Range   Order Confirmation ORDER PROCESSED BY BLOOD BANK   Urinalysis, Routine w reflex microscopic (not at [facility])   Collection Time: 02/14/15 12:10 PM  Result Value Ref Range   Color, Urine YELLOW YELLOW   APPearance CLEAR CLEAR   Specific Gravity, Urine  1.020 1.005 - 1.030   pH 6.0 5.0 - 8.0   Glucose, UA NEGATIVE NEGATIVE mg/dL   Hgb urine dipstick NEGATIVE NEGATIVE   Bilirubin Urine NEGATIVE NEGATIVE   Ketones, ur NEGATIVE NEGATIVE mg/dL   Protein, ur NEGATIVE NEGATIVE mg/dL   Urobilinogen, UA 0.2 0.0 - 1.0 mg/dL   Nitrite NEGATIVE NEGATIVE   Leukocytes, UA NEGATIVE NEGATIVE  Urine rapid drug screen (hosp performed)not at [facility]   Collection Time: 02/14/15 12:10 PM  Result Value Ref Range   Opiates NONE DETECTED NONE DETECTED   Cocaine NONE DETECTED NONE DETECTED   Benzodiazepines NONE DETECTED NONE DETECTED   Amphetamines NONE DETECTED NONE DETECTED   Tetrahydrocannabinol NONE DETECTED NONE DETECTED   Barbiturates NONE DETECTED NONE DETECTED    Imaging Studies:   No evidence of an abruption, no previa   Medications:  Scheduled . betamethasone acetate-betamethasone sodium phosphate  12 mg Intramuscular Once  . docusate sodium  100 mg Oral Daily  . prenatal multivitamin  1 tablet Oral Q1200   I have reviewed the patient's current medications.  ASSESSMENT: G2P1001 [redacted]w[redacted]d Estimated Date of Delivery: 05/21/15  Patient Active Problem List   Diagnosis Date Noted  . Placental abruption 02/14/2015  . Supervision of high risk pregnancy, antepartum 12/24/2014  . Obesity (BMI 30-39.9) 12/24/2014  . Ovarian cyst in pregnancy 12/24/2014  . Complicated migraine Q000111Q    PLAN: Betamethasone course being given Observe in house until no bleeding for 1 week  Veronica Obrien H 02/15/2015,6:58 AM

## 2015-02-15 NOTE — Progress Notes (Signed)
RN to bedside, pt repositioned self to left lateral side, EFM adjusted, FHR located in RLQ.

## 2015-02-15 NOTE — Progress Notes (Signed)
Report given and care relinquished [name], [place].

## 2015-02-15 NOTE — Progress Notes (Signed)
RN to bedside, pt assisted to BR safely. Cat II FHR surveillance prior to removal of EFM.

## 2015-02-15 NOTE — Progress Notes (Addendum)
Continuous FHR monitoring interrupted from 0600 hr to 0636 hrs because patient visited the bathroom, and is presently getting her 33 year old daughter ready for school. RN remains at bedside, EFM was adjusted as soon as patient complete her task.

## 2015-02-16 ENCOUNTER — Encounter (HOSPITAL_COMMUNITY): Payer: Self-pay | Admitting: *Deleted

## 2015-02-16 DIAGNOSIS — O4592 Premature separation of placenta, unspecified, second trimester: Secondary | ICD-10-CM | POA: Diagnosis not present

## 2015-02-16 NOTE — Progress Notes (Signed)
Patient ID: Veronica Obrien, female   DOB: 01-29-1981, 34 y.o.   MRN: [id number] [place]) NOTE  Veronica Obrien is a 34 y.o. G2P1001 with Estimated Date of Delivery: 05/21/15   By  early ultrasound, midtrimester ultrasound [redacted]w[redacted]d  who is admitted for marginal placental abruption.    Fetal presentation is cephalic. Length of Stay:  2  Days  Date of admission:02/14/2015  Subjective: No active bleeding.  Has a small amount of brown staining when she wipes Patient reports the fetal movement as active. Patient reports uterine contraction  activity as none. Patient reports  vaginal bleeding as scant staining. Patient describes fluid per vagina as None.  Vitals:  Blood pressure 110/69, pulse 81, temperature 98.3 F (36.8 C), temperature source Oral, resp. rate 18, height 5\' 8"  (1.727 m), weight 214 lb (97.07 kg), last menstrual period 08/14/2014, SpO2 99 %. Filed Vitals:   02/15/15 1142 02/15/15 1603 02/15/15 1605 02/15/15 2046  BP: 100/54  100/51 110/69  Pulse: 71  85 81  Temp: 98.5 F (36.9 C) 98 F (36.7 C)  98.3 F (36.8 C)  TempSrc: Oral Oral  Oral  Resp: 18   18  Height:      Weight:      SpO2:    99%   Physical Examination:  General appearance - alert, well appearing, and in no distress Heart: regular rate, no murmur Lungs: clear to auscultation bilaterally, no wheezing. Abdomen - soft, mild lower abdominal discomfort with palpation Fundal Height:  size equals dates Extremities: extremities normal, atraumatic, no cyanosis or edema with DTRs 2+ bilaterally Membranes:intact  Fetal Monitoring:     Reassuring for gestational age  Labs:  No results found for this or any previous visit (from the past 61 hour(s)).  Imaging Studies:   No evidence of an abruption, no previa   Medications:  Scheduled . docusate sodium  100 mg Oral Daily  . prenatal multivitamin  1 tablet Oral Q1200  . sodium chloride  3 mL Intravenous Q12H   I have reviewed the  patient's current medications.  ASSESSMENT: G2P1001 [redacted]w[redacted]d Estimated Date of Delivery: 05/21/15  Patient Active Problem List   Diagnosis Date Noted  . Placental abruption 02/14/2015  . Supervision of high risk pregnancy, antepartum 12/24/2014  . Obesity (BMI 30-39.9) 12/24/2014  . Ovarian cyst in pregnancy 12/24/2014  . Complicated migraine Q000111Q    PLAN: #1  Placental abruption  S/p Betamethasone   Observe in house until no bleeding for 1 week  NST twice daily  Truett Mainland, DO 02/16/2015,7:06 AM

## 2015-02-17 DIAGNOSIS — O4592 Premature separation of placenta, unspecified, second trimester: Principal | ICD-10-CM

## 2015-02-17 NOTE — Progress Notes (Signed)
Patient ID: Veronica Obrien, female   DOB: May 28, 1980, 34 y.o.   MRN: [id number] Veronica Obrien) NOTE  [name] is a 34 y.o. G2P1001 with Estimated Date of Delivery: 05/21/15   By  early ultrasound, midtrimester ultrasound [redacted]w[redacted]d  who is admitted for marginal placental abruption.    Fetal presentation is cephalic. Length of Stay:  3  Days  Date of admission:02/14/2015  Subjective: She reports a small amount of brown staining when she wipes Patient reports the fetal movement as active. Patient reports uterine contraction  activity as none. Patient reports  vaginal bleeding as scant staining. Patient describes fluid per vagina as None.  Vitals:  Blood pressure 107/68, pulse 77, temperature 98.3 F (36.8 C), temperature source Oral, resp. rate 20, height 5\' 8"  (1.727 m), weight 214 lb (97.07 kg), last menstrual period 08/14/2014, SpO2 100 %. Filed Vitals:   02/16/15 1604 02/16/15 1605 02/16/15 1955 02/17/15 0636  BP:  95/55 108/64 107/68  Pulse: 78 81 85 77  Temp:  98.1 F (36.7 C) 98.3 F (36.8 C)   TempSrc:  Oral Oral   Resp: 16  20 20   Height:      Weight:      SpO2: 100%      Physical Examination:  General appearance - alert, well appearing, and in no distress Heart: regular rate, no murmur Lungs: clear to auscultation bilaterally, no wheezing. Abdomen - soft, mild lower abdominal discomfort with palpation, gravid Fundal Height:  size equals dates Extremities: extremities normal, atraumatic, no cyanosis or edema with DTRs 2+ bilaterally Membranes:intact  Fetal Monitoring:     Baseline 145, mod variability, reassuring for gestational age. No decels Toco: no contractions  Labs:  No results found for this or any previous visit (from the past 24 hour(s)).  Imaging Studies:   No evidence of an abruption, no previa   Medications:  Scheduled . docusate sodium  100 mg Oral Daily  . prenatal multivitamin  1 tablet Oral Q1200  . sodium chloride  3  mL Intravenous Q12H   I have reviewed the patient's current medications.  ASSESSMENT: G2P1001 [redacted]w[redacted]d Estimated Date of Delivery: 05/21/15  Patient Active Problem List   Diagnosis Date Noted  . Supervision of high risk pregnancy, antepartum 12/24/2014    Priority: Medium  . Obesity (BMI 30-39.9) 12/24/2014    Priority: Medium  . Placental abruption 02/14/2015  . Ovarian cyst in pregnancy 12/24/2014  . Complicated migraine Q000111Q    PLAN: #1  Placental abruption  S/p Betamethasone   Observe in house until no bleeding for 1 week which will be 11/20  NST twice daily  Evert Wenrich, MD 02/17/2015,6:50 AM

## 2015-02-18 ENCOUNTER — Encounter: Payer: Medicaid Other | Admitting: Obstetrics & Gynecology

## 2015-02-18 LAB — TYPE AND SCREEN
ABO/RH(D): O POS
Antibody Screen: NEGATIVE
Unit division: 0
Unit division: 0
Unit division: 0

## 2015-02-18 NOTE — Progress Notes (Signed)
Patient ID: Veronica Obrien, female   DOB: 05-03-1980, 34 y.o.   MRN: [id number] [place]) NOTE  Veronica Obrien is a 34 y.o. G2P1001 with Estimated Date of Delivery: 05/21/15   By  early ultrasound, midtrimester ultrasound [redacted]w[redacted]d  who is admitted for marginal placental abruption.    Fetal presentation is cephalic. Length of Stay:  4  Days  Date of admission:02/14/2015  Subjective: She reports a small amount of brown staining when she wipes Patient reports the fetal movement as active. Patient reports uterine contraction  activity as none. Patient reports  vaginal bleeding as scant staining. Patient describes fluid per vagina as None.  Vitals:  Blood pressure 89/46, pulse 72, temperature 98.1 F (36.7 C), temperature source Oral, resp. rate 18, height 5\' 8"  (1.727 m), weight 207 lb 14.4 oz (94.303 kg), last menstrual period 08/14/2014, SpO2 100 %. Filed Vitals:   02/17/15 1330 02/17/15 1550 02/17/15 2015 02/18/15 0824  BP:  109/45 111/62 89/46  Pulse:  77 87 72  Temp:  98.4 F (36.9 C) 98.4 F (36.9 C) 98.1 F (36.7 C)  TempSrc:  Oral Oral Oral  Resp:  18 20 18   Height:      Weight: 207 lb 14.4 oz (94.303 kg)     SpO2:       Physical Examination:  General appearance - alert, well appearing, and in no distress Heart: regular rate, no murmur Lungs: clear to auscultation bilaterally, no wheezing. Abdomen - soft, mild lower abdominal discomfort with palpation, gravid Fundal Height:  size equals dates Extremities: extremities normal, atraumatic, no cyanosis or edema with DTRs 2+ bilaterally Membranes:intact  Fetal Monitoring:    Baseline 130, mod variability, reassuring for gestational age. No decels Toco: no contractions  Labs:  No results found for this or any previous visit (from the past 24 hour(s)).  Imaging Studies:   No evidence of an abruption, no previa  Medications:  Scheduled . docusate sodium  100 mg Oral Daily  . prenatal  multivitamin  1 tablet Oral Q1200  . sodium chloride  3 mL Intravenous Q12H   I have reviewed the patient's current medications.  ASSESSMENT: G2P1001 [redacted]w[redacted]d Estimated Date of Delivery: 05/21/15  Patient Active Problem List   Diagnosis Date Noted  . Placental abruption 02/14/2015  . Supervision of high risk pregnancy, antepartum 12/24/2014  . Obesity (BMI 30-39.9) 12/24/2014  . Ovarian cyst in pregnancy 12/24/2014  . Complicated migraine Q000111Q    PLAN: #1  Placental abruption  S/p Betamethasone   Observe in house until no bleeding for 1 week which will be 11/20  NST twice daily  Caren Macadam, MD 02/18/2015,2:58 PM

## 2015-02-18 NOTE — Progress Notes (Signed)
Notified Dr. Nehemiah Settle IV infiltrated, order to leave out for now received.

## 2015-02-19 LAB — TYPE AND SCREEN
ABO/RH(D): O POS
Antibody Screen: NEGATIVE

## 2015-02-19 NOTE — Progress Notes (Signed)
Patient ID: Veronica Obrien, female   DOB: 19-Jul-1980, 34 y.o.   MRN: [id number] [place]) NOTE  Veronica Obrien is a 34 y.o. G2P1001 with Estimated Date of Delivery: 05/21/15   By  early ultrasound, midtrimester ultrasound [redacted]w[redacted]d  who is admitted for marginal placental abruption.    Fetal presentation is cephalic. Length of Stay:  5  Days  Date of admission:02/14/2015  Subjective: No active bleeding.  Has a small amount of brown staining when she wipes Patient reports the fetal movement as active. Patient reports uterine contraction  activity as none. Patient reports  vaginal bleeding as scant staining. Patient describes fluid per vagina as None.  Vitals:  Blood pressure 110/69, pulse 83, temperature 98.1 F (36.7 C), temperature source Oral, resp. rate 20, height 5\' 8"  (1.727 m), weight 207 lb 14.4 oz (94.303 kg), last menstrual period 08/14/2014, SpO2 100 %. Filed Vitals:   02/17/15 2015 02/18/15 0824 02/18/15 1531 02/18/15 2026  BP: 111/62 89/46 104/60 110/69  Pulse: 87 72 82 83  Temp: 98.4 F (36.9 C) 98.1 F (36.7 C) 98.1 F (36.7 C) 98.1 F (36.7 C)  TempSrc: Oral Oral Oral Oral  Resp: 20 18 18 20   Height:      Weight:      SpO2:       Physical Examination:  General appearance - alert, well appearing, and in no distress Heart: regular rate, no murmur Lungs: clear to auscultation bilaterally, no wheezing. Abdomen - soft, mild lower abdominal discomfort with palpation Fundal Height:  size equals dates Extremities: extremities normal, atraumatic, no cyanosis or edema with DTRs 2+ bilaterally Membranes:intact  Fetal Monitoring:     Reassuring for gestational age  Labs:  No results found for this or any previous visit (from the past 88 hour(s)).  Imaging Studies:   No evidence of an abruption, no previa   Medications:  Scheduled . docusate sodium  100 mg Oral Daily  . prenatal multivitamin  1 tablet Oral Q1200  . sodium chloride  3 mL  Intravenous Q12H   I have reviewed the patient's current medications.  ASSESSMENT: G2P1001 [redacted]w[redacted]d Estimated Date of Delivery: 05/21/15  Patient Active Problem List   Diagnosis Date Noted  . Placental abruption 02/14/2015  . Supervision of high risk pregnancy, antepartum 12/24/2014  . Obesity (BMI 30-39.9) 12/24/2014  . Ovarian cyst in pregnancy 12/24/2014  . Complicated migraine Q000111Q    PLAN: #1  Placental abruption  Stable - no active bleeding currently  S/p Betamethasone   Observe in house until no bleeding for 1 week  NST twice daily  Truett Mainland, DO 02/19/2015,6:43 AM

## 2015-02-20 DIAGNOSIS — O4592 Premature separation of placenta, unspecified, second trimester: Secondary | ICD-10-CM | POA: Diagnosis not present

## 2015-02-20 NOTE — Progress Notes (Signed)
Patient off floor to celebrate birthday with family. Patient went by wheelchair.

## 2015-02-20 NOTE — Progress Notes (Signed)
Patient ID: NHI [name], female   DOB: Jul 09, 1980, 34 y.o.   MRN: [id number] [place]) NOTE  Veronica Obrien is a 34 y.o. G2P1001 at [redacted]w[redacted]d who is admitted for marginal placental abruption.   Estimated Date of Delivery: 05/21/15. Dated by early ultrasound.    Fetal presentation is cephalic.  Length of Stay:  6  Days  Date of admission:02/14/2015  Subjective: No active bleeding since 02/14/2015 Patient reports the fetal movement as active. Patient reports uterine contraction  activity as none. Patient reports  vaginal bleeding as scant staining. Patient describes fluid per vagina as None.  Vitals:  Blood pressure 114/57, pulse 79, temperature 97.7 F (36.5 C), temperature source Oral, resp. rate 18, height 5\' 8"  (1.727 m), weight 207 lb 14.4 oz (94.303 kg), last menstrual period 08/14/2014, SpO2 100 %. Filed Vitals:   02/19/15 0739 02/19/15 1147 02/19/15 1652 02/19/15 2007  BP: 112/69 116/70 112/65 114/57  Pulse: 79 86 90 79  Temp: 97.7 F (36.5 C) 98 F (36.7 C) 98.1 F (36.7 C) 97.7 F (36.5 C)  TempSrc: Oral Oral Oral Oral  Resp: 20 18 18 18   Height:      Weight:      SpO2:       Physical Examination: General appearance - alert, well appearing, and in no distress Heart: regular rate, no murmur Lungs: clear to auscultation bilaterally, no wheezing. Abdomen - soft, mild lower abdominal discomfort with palpation Fundal Height:  size equals dates Extremities: extremities normal, atraumatic, no cyanosis or edema with DTRs 2+ bilaterally Membranes:intact  Fetal Monitoring:   Reassuring for gestational age  Labs:  Results for orders placed or performed during the hospital encounter of 02/14/15 (from the past 24 hour(s))  Type and screen [place]   Collection Time: 02/19/15  8:45 PM  Result Value Ref Range   ABO/RH(D) O POS    Antibody Screen NEG    Sample Expiration 02/22/2015     Imaging Studies:   No evidence of  an abruption, no previa  Medications:  Scheduled . docusate sodium  100 mg Oral Daily  . prenatal multivitamin  1 tablet Oral Q1200   I have reviewed the patient's current medications.  ASSESSMENT: Estimated Date of Delivery: 05/21/15  Patient Active Problem List   Diagnosis Date Noted  . Placental abruption 02/14/2015  . Supervision of high risk pregnancy, antepartum 12/24/2014  . Obesity (BMI 30-39.9) 12/24/2014  . Ovarian cyst in pregnancy 12/24/2014  . Complicated migraine Q000111Q    PLAN: Placental abruption  Stable - no active bleeding currently  S/p Betamethasone   Observe in house until no bleeding for 1 week; can be discharged tomorrow  NST twice daily  Routine antenatal care   Osborne Oman, MD 02/20/2015,5:42 AM

## 2015-02-21 NOTE — Discharge Summary (Signed)
    OB Discharge Summary     Patient Name: Veronica Obrien DOB: 02-Dec-1980 MRN: [id number]  Date of admission: 02/14/2015 Delivering MD: This patient has no babies on file.  Date of discharge: 02/21/2015  Admitting diagnosis: 28 WKS, VAG BLEEDING, LLQ PAIN Intrauterine pregnancy: [redacted]w[redacted]d     Secondary diagnosis:  Active Problems:   Placental abruption  Additional problems: same as above     Discharge diagnosis: marginal placental abruption                                   Hospital course:  Patient admitted on 02/14/2015 secondary to vaginal bleeding following a fall. On ultrasound, there was no evidence of placental abruption. Her bleeding quickly turned into brown discharge and she was observed for a total of 7 days. Throughout her hospitalization, the maternal-fetal unit remained stable. She completed a course of betamethasone and received magnesium sulfate for CP prophylaxis. Discharge instructions were provided. Follow up prenatal appointment has been ordered.  Physical exam  Filed Vitals:   02/20/15 2045 02/20/15 2130 02/20/15 2200 02/20/15 2300  BP:      Pulse:      Temp:      TempSrc:      Resp: 18 18 18 16   Height:      Weight:      SpO2:       General: alert, cooperative and no distress Abdomen: soft, gravid, non tender Extremity: equal in size, non tender, no edema NST: baseline 140, mod variability, + accels, no decels Toco: no contractions  Labs: Lab Results  Component Value Date   WBC 10.7* 02/14/2015   HGB 11.8* 02/14/2015   HCT 34.7* 02/14/2015   MCV 85.5 02/14/2015   PLT 311 02/14/2015   CMP Latest Ref Rng 02/14/2015  Glucose 65 - 99 mg/dL 121(H)  BUN 6 - 20 mg/dL 8  Creatinine 0.44 - 1.00 mg/dL 0.64  Sodium 135 - 145 mmol/L 138  Potassium 3.5 - 5.1 mmol/L 3.9  Chloride 101 - 111 mmol/L 109  CO2 22 - 32 mmol/L 21(L)  Calcium 8.9 - 10.3 mg/dL 9.5  Total Protein 6.5 - 8.1 g/dL 6.5  Total Bilirubin 0.3 - 1.2 mg/dL 0.3  Alkaline Phos 38 - 126 U/L  89  AST 15 - 41 U/L 20  ALT 14 - 54 U/L 16    After visit meds:    Medication List    STOP taking these medications        oxyCODONE-acetaminophen 5-325 MG tablet  Commonly known as:  PERCOCET/ROXICET      TAKE these medications        acetaminophen 500 MG tablet  Commonly known as:  TYLENOL  Take 1 tablet (500 mg total) by mouth every 6 (six) hours as needed.     prenatal multivitamin Tabs tablet  Take 1 tablet by mouth daily.        Diet: routine diet  Activity: Advance as tolerated.   Outpatient follow up:for routine prenatal care Follow up Appt:No future appointments. Follow up Visit:No Follow-up on file.    02/21/2015 Montavis Schubring, MD

## 2015-02-21 NOTE — Progress Notes (Signed)
Patient discharged at 1020 per wheelchair with significant other at side. Patient provided teach back of discharge instructions. Patient to return as needed

## 2015-03-01 ENCOUNTER — Encounter: Payer: Self-pay | Admitting: Obstetrics and Gynecology

## 2015-03-01 ENCOUNTER — Ambulatory Visit (INDEPENDENT_AMBULATORY_CARE_PROVIDER_SITE_OTHER): Payer: Medicaid Other | Admitting: Obstetrics and Gynecology

## 2015-03-01 VITALS — BP 119/72 | HR 86 | Temp 97.8°F | Wt 207.8 lb

## 2015-03-01 DIAGNOSIS — O4592 Premature separation of placenta, unspecified, second trimester: Secondary | ICD-10-CM

## 2015-03-01 DIAGNOSIS — E669 Obesity, unspecified: Secondary | ICD-10-CM

## 2015-03-01 DIAGNOSIS — O0993 Supervision of high risk pregnancy, unspecified, third trimester: Secondary | ICD-10-CM

## 2015-03-01 DIAGNOSIS — O99820 Streptococcus B carrier state complicating pregnancy: Secondary | ICD-10-CM

## 2015-03-01 LAB — POCT URINALYSIS DIP (DEVICE)
Bilirubin Urine: NEGATIVE
Glucose, UA: NEGATIVE mg/dL
Hgb urine dipstick: NEGATIVE
Ketones, ur: NEGATIVE mg/dL
Leukocytes, UA: NEGATIVE
Nitrite: NEGATIVE
Protein, ur: NEGATIVE mg/dL
Specific Gravity, Urine: 1.015 (ref 1.005–1.030)
Urobilinogen, UA: 0.2 mg/dL (ref 0.0–1.0)
pH: 7 (ref 5.0–8.0)

## 2015-03-01 LAB — CBC
HCT: 35.1 % — ABNORMAL LOW (ref 36.0–46.0)
Hemoglobin: 12.1 g/dL (ref 12.0–15.0)
MCH: 29.2 pg (ref 26.0–34.0)
MCHC: 34.5 g/dL (ref 30.0–36.0)
MCV: 84.6 fL (ref 78.0–100.0)
MPV: 9.1 fL (ref 8.6–12.4)
Platelets: 294 10*3/uL (ref 150–400)
RBC: 4.15 MIL/uL (ref 3.87–5.11)
RDW: 13.6 % (ref 11.5–15.5)
WBC: 9.1 10*3/uL (ref 4.0–10.5)

## 2015-03-01 NOTE — Progress Notes (Signed)
Pain- "like something is scrapping inside after lying for period of time and starting to walk" Pt reports "old blood"  28 wk labs today  28 wk packet given

## 2015-03-01 NOTE — Progress Notes (Signed)
Subjective:  Veronica Obrien is a 34 y.o. G2P1001 at [redacted]w[redacted]d being seen today for ongoing prenatal care.  She is currently monitored for the following issues for this high-risk pregnancy and has Complicated migraine; Supervision of high risk pregnancy, antepartum; Obesity (BMI 30-39.9); Ovarian cyst in pregnancy; and Placental abruption on her problem list.  Patient reports one time episode of lower abdominal pain. Nothing since.  Contractions: Not present. Vag. Bleeding: None.  Movement: Present. Denies leaking of fluid.   The following portions of the patient's history were reviewed and updated as appropriate: allergies, current medications, past family history, past medical history, past social history, past surgical history and problem list. Problem list updated.  Objective:   Filed Vitals:   03/01/15 0946  BP: 119/72  Pulse: 86  Temp: 97.8 F (36.6 C)  Weight: 207 lb 12.8 oz (94.257 kg)    Fetal Status: Fetal Heart Rate (bpm): 142   Movement: Present     General:  Alert, oriented and cooperative. Patient is in no acute distress.  Skin: Skin is warm and dry. No rash noted.   Cardiovascular: Normal heart rate noted  Respiratory: Normal respiratory effort, no problems with respiration noted  Abdomen: Soft, gravid, appropriate for gestational age. Pain/Pressure: Present     Pelvic: Vag. Bleeding: None Vag D/C Character: Other (Comment)   Cervical exam deferred        Extremities: Normal range of motion.  Edema: None  Mental Status: Normal mood and affect. Normal behavior. Normal judgment and thought content.   Urinalysis:      Assessment and Plan:  Pregnancy: G2P1001 at [redacted]w[redacted]d  1. Supervision of high risk pregnancy, antepartum, third trimester Patient is doing well - CBC - HIV antibody - RPR - Glucose Tolerance, 1 HR (50g)  2. Obesity (BMI 30-39.9)   3. Placental abruption, second trimester No further episodes of vaginal bleeding since discharge  Preterm labor symptoms and  general obstetric precautions including but not limited to vaginal bleeding, contractions, leaking of fluid and fetal movement were reviewed in detail with the patient. Please refer to After Visit Summary for other counseling recommendations.  Return in about 2 weeks (around 03/15/2015).   Mora Bellman, MD

## 2015-03-02 LAB — RPR

## 2015-03-02 LAB — HIV ANTIBODY (ROUTINE TESTING W REFLEX): HIV 1&2 Ab, 4th Generation: NONREACTIVE

## 2015-03-02 LAB — GLUCOSE TOLERANCE, 1 HOUR (50G) W/O FASTING: Glucose, 1 Hour GTT: 137 mg/dL (ref 70–140)

## 2015-03-03 ENCOUNTER — Telehealth: Payer: Self-pay | Admitting: *Deleted

## 2015-03-03 NOTE — Telephone Encounter (Signed)
Per Dr. Elly Modena message abnormal 1 hr GTT, needs 3 hr GTT scheduled before next appointment.

## 2015-03-03 NOTE — Telephone Encounter (Signed)
[employer] and notified her that she had an elevated 1 hr GTT and we need to do a 3 hr GTT. Explained she needs to be fasting and will get fasting and hourly blood draws for 3 hours. She voiced understanding and agreed to an appointment  03/05/15 0800.

## 2015-03-05 ENCOUNTER — Other Ambulatory Visit: Payer: Self-pay

## 2015-03-06 ENCOUNTER — Encounter (HOSPITAL_COMMUNITY): Payer: Self-pay | Admitting: *Deleted

## 2015-03-06 ENCOUNTER — Inpatient Hospital Stay (HOSPITAL_COMMUNITY): Payer: Medicaid Other

## 2015-03-06 ENCOUNTER — Inpatient Hospital Stay (HOSPITAL_COMMUNITY)
Admission: AD | Admit: 2015-03-06 | Discharge: 2015-03-20 | DRG: 774 | Disposition: A | Discharge status: Home / Self Care | Payer: Medicaid Other | Source: Ambulatory Visit | Attending: Obstetrics and Gynecology | Admitting: Obstetrics and Gynecology

## 2015-03-06 DIAGNOSIS — Z8632 Personal history of gestational diabetes: Secondary | ICD-10-CM | POA: Diagnosis not present

## 2015-03-06 DIAGNOSIS — Z833 Family history of diabetes mellitus: Secondary | ICD-10-CM | POA: Diagnosis not present

## 2015-03-06 DIAGNOSIS — Z87891 Personal history of nicotine dependence: Secondary | ICD-10-CM | POA: Diagnosis not present

## 2015-03-06 DIAGNOSIS — N939 Abnormal uterine and vaginal bleeding, unspecified: Secondary | ICD-10-CM

## 2015-03-06 DIAGNOSIS — Z8249 Family history of ischemic heart disease and other diseases of the circulatory system: Secondary | ICD-10-CM

## 2015-03-06 DIAGNOSIS — Z3A30 30 weeks gestation of pregnancy: Secondary | ICD-10-CM | POA: Diagnosis not present

## 2015-03-06 DIAGNOSIS — O24913 Unspecified diabetes mellitus in pregnancy, third trimester: Secondary | ICD-10-CM | POA: Diagnosis not present

## 2015-03-06 DIAGNOSIS — O3482 Maternal care for other abnormalities of pelvic organs, second trimester: Secondary | ICD-10-CM

## 2015-03-06 DIAGNOSIS — Z3A29 29 weeks gestation of pregnancy: Secondary | ICD-10-CM

## 2015-03-06 DIAGNOSIS — O24919 Unspecified diabetes mellitus in pregnancy, unspecified trimester: Secondary | ICD-10-CM | POA: Diagnosis not present

## 2015-03-06 DIAGNOSIS — O24429 Gestational diabetes mellitus in childbirth, unspecified control: Secondary | ICD-10-CM | POA: Diagnosis present

## 2015-03-06 DIAGNOSIS — O4593 Premature separation of placenta, unspecified, third trimester: Secondary | ICD-10-CM | POA: Diagnosis not present

## 2015-03-06 DIAGNOSIS — O24419 Gestational diabetes mellitus in pregnancy, unspecified control: Secondary | ICD-10-CM

## 2015-03-06 DIAGNOSIS — O3483 Maternal care for other abnormalities of pelvic organs, third trimester: Secondary | ICD-10-CM | POA: Diagnosis present

## 2015-03-06 DIAGNOSIS — O0993 Supervision of high risk pregnancy, unspecified, third trimester: Secondary | ICD-10-CM

## 2015-03-06 DIAGNOSIS — O458X3 Other premature separation of placenta, third trimester: Secondary | ICD-10-CM | POA: Diagnosis present

## 2015-03-06 DIAGNOSIS — N83209 Unspecified ovarian cyst, unspecified side: Secondary | ICD-10-CM | POA: Diagnosis present

## 2015-03-06 DIAGNOSIS — O4693 Antepartum hemorrhage, unspecified, third trimester: Secondary | ICD-10-CM | POA: Diagnosis present

## 2015-03-06 DIAGNOSIS — E669 Obesity, unspecified: Secondary | ICD-10-CM

## 2015-03-06 DIAGNOSIS — O24415 Gestational diabetes mellitus in pregnancy, controlled by oral hypoglycemic drugs: Secondary | ICD-10-CM | POA: Diagnosis not present

## 2015-03-06 LAB — URINALYSIS, ROUTINE W REFLEX MICROSCOPIC
Bilirubin Urine: NEGATIVE
Glucose, UA: NEGATIVE mg/dL
Hgb urine dipstick: NEGATIVE
Ketones, ur: NEGATIVE mg/dL
Leukocytes, UA: NEGATIVE
Nitrite: NEGATIVE
Protein, ur: NEGATIVE mg/dL
Specific Gravity, Urine: 1.015 (ref 1.005–1.030)
pH: 6.5 (ref 5.0–8.0)

## 2015-03-06 LAB — OB RESULTS CONSOLE GBS: GBS: NEGATIVE

## 2015-03-06 LAB — CBC
HCT: 34.9 % — ABNORMAL LOW (ref 36.0–46.0)
Hemoglobin: 11.6 g/dL — ABNORMAL LOW (ref 12.0–15.0)
MCH: 28.4 pg (ref 26.0–34.0)
MCHC: 33.2 g/dL (ref 30.0–36.0)
MCV: 85.3 fL (ref 78.0–100.0)
Platelets: 280 10*3/uL (ref 150–400)
RBC: 4.09 MIL/uL (ref 3.87–5.11)
RDW: 13.5 % (ref 11.5–15.5)
WBC: 9.7 10*3/uL (ref 4.0–10.5)

## 2015-03-06 MED ORDER — DOCUSATE SODIUM 100 MG PO CAPS
100.0000 mg | ORAL_CAPSULE | Freq: Every day | ORAL | Status: DC
  Administered 2015-03-07 – 2015-03-19 (×13): 100 mg via ORAL
  Filled 2015-03-06 (×14): qty 1

## 2015-03-06 MED ORDER — ZOLPIDEM TARTRATE 5 MG PO TABS: 5.0000 mg | ORAL_TABLET | Freq: Every evening | ORAL | Status: DC | PRN

## 2015-03-06 MED ORDER — ACETAMINOPHEN 325 MG PO TABS
650.0000 mg | ORAL_TABLET | ORAL | Status: DC | PRN
  Administered 2015-03-07 – 2015-03-18 (×4): 650 mg via ORAL
  Filled 2015-03-06 (×4): qty 2

## 2015-03-06 MED ORDER — PRENATAL MULTIVITAMIN CH
1.0000 | ORAL_TABLET | Freq: Every day | ORAL | Status: DC
  Administered 2015-03-07 – 2015-03-19 (×13): 1 via ORAL
  Filled 2015-03-06 (×14): qty 1

## 2015-03-06 MED ORDER — CALCIUM CARBONATE ANTACID 500 MG PO CHEW
2.0000 | CHEWABLE_TABLET | ORAL | Status: DC | PRN
  Administered 2015-03-09 – 2015-03-17 (×4): 400 mg via ORAL
  Filled 2015-03-06 (×5): qty 2

## 2015-03-06 NOTE — H&P (Signed)
ANTEPARTUM ADMISSION HISTORY AND PHYSICAL NOTE   History of Present Illness: Veronica Obrien is a 34 y.o. G2P1001 at [redacted]w[redacted]d admitted for vaginal bleeding.  Recent history abruption. Hospitalized ~11/13-11/20 for vaginal bleeding after a fall. Had initial bleed and then brown discharge which has continued. Received bmz x2 and Mg. No more recent trauma. No abdomianl pain or contractions. But today did notice a red blood clot the size of a quarter today, and since then has had light red bleeding.  Patient reports the fetal movement as active. Patient reports uterine contraction activity as none. Patient describes fluid per vagina as None. Fetal presentation is unsure.  Patient Active Problem List   Diagnosis Date Noted  . Placental abruption 02/14/2015  . Supervision of high risk pregnancy, antepartum 12/24/2014  . Obesity (BMI 30-39.9) 12/24/2014  . Ovarian cyst in pregnancy 12/24/2014  . Complicated migraine Q000111Q    Past Medical History  Diagnosis Date  . Medical history non-contributory     Past Surgical History  Procedure Laterality Date  . Wisdom tooth extraction      OB History  Gravida Para Term Preterm AB SAB TAB Ectopic Multiple Living  2 1 1  0 0 0 0 0 0 1    # Outcome Date GA Lbr Len/2nd Weight Sex Delivery Anes PTL Lv  2 Current           1 Term 05/18/06 [redacted]w[redacted]d  6 lb 4 oz (2.835 kg) F Vag-Spont EPI N Y      Social History   Social History  . Marital Status: Single    Spouse Name: N/A  . Number of Children: N/A  . Years of Education: N/A   Social History Main Topics  . Smoking status: Former Smoker -- 0.00 packs/day    Types: Cigarettes    Quit date: 06/06/2014  . Smokeless tobacco: Never Used  . Alcohol Use: No     Comment: NOT SINCE PREGNANCY  . Drug Use: No  . Sexual Activity: Yes   Other Topics  Concern  . None   Social History Narrative    Family History  Problem Relation Age of Onset  . Arthritis Father   . Cancer Sister   . Heart disease Maternal Grandfather   . Diabetes Maternal Grandfather   . Diabetes Paternal Grandmother   . Cancer Paternal Grandmother   . Diabetes Paternal Grandfather     Allergies  Allergen Reactions  . Novocain [Procaine Hcl] Other (See Comments)    Pt states that it makes her try to bite her tongue.     Prescriptions prior to admission  Medication Sig Dispense Refill Last Dose  . acetaminophen (TYLENOL) 500 MG tablet Take 1 tablet (500 mg total) by mouth every 6 (six) hours as needed. (Patient taking differently: Take 500 mg by mouth every 6 (six) hours as needed for mild pain or headache. ) 30 tablet 0 Past Month at Unknown time  . docusate sodium (COLACE) 100 MG capsule Take 100 mg by mouth at bedtime.    03/05/2015 at Unknown time  . Prenatal Vit-Fe Fumarate-FA (PRENATAL MULTIVITAMIN) TABS tablet Take 1 tablet by mouth at bedtime.    03/05/2015 at Unknown time    Review of Systems - Negative except as described above  Vitals: LMP 08/14/2014 Physical Examination: CONSTITUTIONAL: Well-developed, well-nourished female in no acute distress.  HENT: Normocephalic, atraumatic, External right and left ear normal. Oropharynx is clear and moist EYES: Conjunctivae and EOM are normal. Pupils are equal, round, and  reactive to light. No scleral icterus.  NECK: Normal range of motion, supple, no masses SKIN: Skin is warm and dry. No rash noted. Not diaphoretic. No erythema. No pallor. [place]: Alert and oriented to person, place, and time. Normal reflexes, muscle tone coordination. No cranial nerve deficit noted. PSYCHIATRIC: Normal mood and affect. Normal behavior. Normal judgment and thought content. CARDIOVASCULAR: Normal heart rate noted, regular rhythm RESPIRATORY:  Effort and breath sounds normal, no problems with respiration noted ABDOMEN: Soft, nontender, nondistended, gravid. MUSCULOSKELETAL: Normal range of motion. No edema and no tenderness. 2+ distal pulses.  Cervix: moderate amount red/brown discharge posterior fornix, normal appearing cervix. Fingertip/thick/high Membranes:intact Fetal Monitoring: 140/mod/+a/-d Tocometer: Flat  Labs:  Results for orders placed or performed during the hospital encounter of 03/06/15 (from the past 24 hour(s))  Urinalysis, Routine w reflex microscopic (not at [facility])   Collection Time: 03/06/15 10:10 PM  Result Value Ref Range   Color, Urine YELLOW YELLOW   APPearance CLEAR CLEAR   Specific Gravity, Urine 1.015 1.005 - 1.030   pH 6.5 5.0 - 8.0   Glucose, UA NEGATIVE NEGATIVE mg/dL   Hgb urine dipstick NEGATIVE NEGATIVE   Bilirubin Urine NEGATIVE NEGATIVE   Ketones, ur NEGATIVE NEGATIVE mg/dL   Protein, ur NEGATIVE NEGATIVE mg/dL   Nitrite NEGATIVE NEGATIVE   Leukocytes, UA NEGATIVE NEGATIVE    Imaging Studies:  Imaging Results    [place] Mfm Ob Limited  02/14/2015 OBSTETRICAL ULTRASOUND: This exam was performed within a [facility]. The OB US report was generated in the AS system, and faxed to the ordering physician. This report is available in the [facility]. See the AS Obstetric US report via the Image Link.     Assessment and Plan: Patient Active Problem List   Diagnosis Date Noted  . Placental abruption 02/14/2015  . Supervision of high risk pregnancy, antepartum 12/24/2014  . Obesity (BMI 30-39.9) 12/24/2014  . Ovarian cyst in pregnancy 12/24/2014  . Complicated migraine Q000111Q   # Placental abruption - recent hx abruption, now with new bleeding. Mild amount, no ctxns or uterine tenderness. Not in labor, reactive nst - repeat u/s - received bmz x2 less than 4 weeks ago, repeating not indicated -  blood type o positive, no indication for rhogam - not in labor so holding on neuroprotective Mg - GBS culture obtained - will order 3-hour GTT as failed 1-hour recently (137) - NST q shift  Admit to Antenatal Routine antenatal care  Desma Maxim, MD Attending [place], [facility]

## 2015-03-06 NOTE — MAU Provider Note (Signed)
ANTEPARTUM ADMISSION HISTORY AND PHYSICAL NOTE   History of Present Illness: Veronica Obrien is a 34 y.o. G2P1001 at [redacted]w[redacted]d admitted for vaginal bleeding.  Recent history abruption. Hospitalized ~11/13-11/20 for vaginal bleeding after a fall. Had initial bleed and then brown discharge which has continued. Received bmz x2 and Mg. No more recent trauma. No abdomianl pain or contractions. But today did notice a red blood clot the size of a quarter today, and since then has had light red bleeding.  Patient reports the fetal movement as active. Patient reports uterine contraction  activity as none. Patient describes fluid per vagina as None. Fetal presentation is unsure.  Patient Active Problem List   Diagnosis Date Noted  . Placental abruption 02/14/2015  . Supervision of high risk pregnancy, antepartum 12/24/2014  . Obesity (BMI 30-39.9) 12/24/2014  . Ovarian cyst in pregnancy 12/24/2014  . Complicated migraine Q000111Q    Past Medical History  Diagnosis Date  . Medical history non-contributory     Past Surgical History  Procedure Laterality Date  . Wisdom tooth extraction      OB History  Gravida Para Term Preterm AB SAB TAB Ectopic Multiple Living  2 1 1  0 0 0 0 0 0 1    # Outcome Date GA Lbr Len/2nd Weight Sex Delivery Anes PTL Lv  2 Current           1 Term 05/18/06 [redacted]w[redacted]d  6 lb 4 oz (2.835 kg) F Vag-Spont EPI N Y      Social History   Social History  . Marital Status: Single    Spouse Name: N/A  . Number of Children: N/A  . Years of Education: N/A   Social History Main Topics  . Smoking status: Former Smoker -- 0.00 packs/day    Types: Cigarettes    Quit date: 06/06/2014  . Smokeless tobacco: Never Used  . Alcohol Use: No     Comment: NOT SINCE PREGNANCY  . Drug Use: No  . Sexual Activity: Yes   Other Topics Concern  . None   Social History Narrative    Family History  Problem Relation Age of Onset  . Arthritis Father   . Cancer Sister   . Heart  disease Maternal Grandfather   . Diabetes Maternal Grandfather   . Diabetes Paternal Grandmother   . Cancer Paternal Grandmother   . Diabetes Paternal Grandfather     Allergies  Allergen Reactions  . Novocain [Procaine Hcl] Other (See Comments)    Pt states that it makes her try to bite her tongue.      Prescriptions prior to admission  Medication Sig Dispense Refill Last Dose  . acetaminophen (TYLENOL) 500 MG tablet Take 1 tablet (500 mg total) by mouth every 6 (six) hours as needed. (Patient taking differently: Take 500 mg by mouth every 6 (six) hours as needed for mild pain or headache. ) 30 tablet 0 Past Month at Unknown time  . docusate sodium (COLACE) 100 MG capsule Take 100 mg by mouth at bedtime.    03/05/2015 at Unknown time  . Prenatal Vit-Fe Fumarate-FA (PRENATAL MULTIVITAMIN) TABS tablet Take 1 tablet by mouth at bedtime.    03/05/2015 at Unknown time    Review of Systems - Negative except as described above  Vitals:  LMP 08/14/2014 Physical Examination: CONSTITUTIONAL: Well-developed, well-nourished female in no acute distress.  HENT:  Normocephalic, atraumatic, External right and left ear normal. Oropharynx is clear and moist EYES: Conjunctivae and EOM are normal. Pupils  are equal, round, and reactive to light. No scleral icterus.  NECK: Normal range of motion, supple, no masses SKIN: Skin is warm and dry. No rash noted. Not diaphoretic. No erythema. No pallor. [place]: Alert and oriented to person, place, and time. Normal reflexes, muscle tone coordination. No cranial nerve deficit noted. PSYCHIATRIC: Normal mood and affect. Normal behavior. Normal judgment and thought content. CARDIOVASCULAR: Normal heart rate noted, regular rhythm RESPIRATORY: Effort and breath sounds normal, no problems with respiration noted ABDOMEN: Soft, nontender, nondistended, gravid. MUSCULOSKELETAL: Normal range of motion. No edema and no tenderness. 2+ distal pulses.  Cervix: moderate  amount red/brown discharge posterior fornix, normal appearing cervix. Fingertip/thick/high Membranes:intact Fetal Monitoring: 140/mod/+a/-d Tocometer: Flat  Labs:  Results for orders placed or performed during the hospital encounter of 03/06/15 (from the past 24 hour(s))  Urinalysis, Routine w reflex microscopic (not at [facility])   Collection Time: 03/06/15 10:10 PM  Result Value Ref Range   Color, Urine YELLOW YELLOW   APPearance CLEAR CLEAR   Specific Gravity, Urine 1.015 1.005 - 1.030   pH 6.5 5.0 - 8.0   Glucose, UA NEGATIVE NEGATIVE mg/dL   Hgb urine dipstick NEGATIVE NEGATIVE   Bilirubin Urine NEGATIVE NEGATIVE   Ketones, ur NEGATIVE NEGATIVE mg/dL   Protein, ur NEGATIVE NEGATIVE mg/dL   Nitrite NEGATIVE NEGATIVE   Leukocytes, UA NEGATIVE NEGATIVE    Imaging Studies: [place] Mfm Ob Limited  02/14/2015  OBSTETRICAL ULTRASOUND: This exam was performed within a [facility]. The OB US report was generated in the AS system, and faxed to the ordering physician.  This report is available in the [facility]. See the AS Obstetric US report via the Image Link.    Assessment and Plan: Patient Active Problem List   Diagnosis Date Noted  . Placental abruption 02/14/2015  . Supervision of high risk pregnancy, antepartum 12/24/2014  . Obesity (BMI 30-39.9) 12/24/2014  . Ovarian cyst in pregnancy 12/24/2014  . Complicated migraine Q000111Q   # Placental abruption - recent hx abruption, now with new bleeding. Mild amount, no ctxns or uterine tenderness. Not in labor, reactive nst - repeat u/s - received bmz x2 less than 4 weeks ago, repeating not indicated - not in labor so holding on neuroprotective Mg - GBS culture obtained - will order 3-hour GTT as failed 1-hour recently (137) - NST q shift  Admit to Antenatal Routine antenatal care  Desma Maxim, MD Attending [place], [facility]

## 2015-03-06 NOTE — MAU Note (Signed)
Hx low lying placenta. Was hospitalized several wks ago. Placenta has moved upward. Had some vag bleeding tonight with quarter size clot. Was red but not bright red. Bleeding started at 1830 but now spotting. Lower back pain

## 2015-03-07 ENCOUNTER — Encounter (HOSPITAL_COMMUNITY): Payer: Self-pay | Admitting: *Deleted

## 2015-03-07 DIAGNOSIS — Z8632 Personal history of gestational diabetes: Secondary | ICD-10-CM | POA: Diagnosis not present

## 2015-03-07 DIAGNOSIS — O24419 Gestational diabetes mellitus in pregnancy, unspecified control: Secondary | ICD-10-CM | POA: Diagnosis not present

## 2015-03-07 LAB — TYPE AND SCREEN
ABO/RH(D): O POS
Antibody Screen: NEGATIVE

## 2015-03-07 LAB — GLUCOSE, FASTING GESTATIONAL: Glucose Tolerance, Fasting: 117 mg/dL

## 2015-03-07 LAB — GLUCOSE, 1 HOUR GESTATIONAL: Glucose Tolerance, 1 hour: 216 mg/dL — ABNORMAL HIGH (ref 70–189)

## 2015-03-07 LAB — GLUCOSE, 3 HOUR GESTATIONAL: Glucose, GTT - 3 Hour: 101 mg/dL (ref 70–144)

## 2015-03-07 LAB — GLUCOSE, 2 HOUR GESTATIONAL: Glucose Tolerance, 2 hour: 184 mg/dL — ABNORMAL HIGH (ref 70–164)

## 2015-03-07 NOTE — Progress Notes (Addendum)
Patient ID: Veronica Obrien, female   DOB: 12-10-1980, 34 y.o.   MRN: [id number] [place]) NOTE  Veronica Obrien is a 34 y.o. G2P1001 at [redacted]w[redacted]d  who is admitted for vaginal bleeding and presumed placental abruption.   Length of Stay:  1  Days  Subjective:  Patient reports the fetal movement as active. Patient reports uterine contraction  activity as none. Patient reports  vaginal bleeding as scant staining. Patient describes fluid per vagina as None.  Vitals:  Blood pressure 110/61, pulse 88, temperature 98.4 F (36.9 C), temperature source Oral, resp. rate 16, height 5\' 8"  (1.727 m), weight 211 lb (95.709 kg), last menstrual period 08/14/2014, SpO2 100 %. Physical Examination:  General appearance - alert, well appearing, and in no distress Abdomen - soft, non tneder Extremities - Homan's sign negative bilaterally  Fetal Monitoring:  Baseline: 130 bpm, Variability: Good {> 6 bpm), Accelerations: Reactive and Decelerations: 1 variable otherwise category 1  Labs:  Results for orders placed or performed during the hospital encounter of 03/06/15 (from the past 24 hour(s))  Urinalysis, Routine w reflex microscopic (not at [facility])   Collection Time: 03/06/15 10:10 PM  Result Value Ref Range   Color, Urine YELLOW YELLOW   APPearance CLEAR CLEAR   Specific Gravity, Urine 1.015 1.005 - 1.030   pH 6.5 5.0 - 8.0   Glucose, UA NEGATIVE NEGATIVE mg/dL   Hgb urine dipstick NEGATIVE NEGATIVE   Bilirubin Urine NEGATIVE NEGATIVE   Ketones, ur NEGATIVE NEGATIVE mg/dL   Protein, ur NEGATIVE NEGATIVE mg/dL   Nitrite NEGATIVE NEGATIVE   Leukocytes, UA NEGATIVE NEGATIVE  Culture, beta strep (group b only)   Collection Time: 03/06/15 10:50 PM  Result Value Ref Range   Specimen Description VAGINAL/RECTAL    Special Requests NONE    Culture      Culture reincubated for better growth Performed at [facility]    Report Status PENDING   CBC   Collection Time:  03/06/15 11:20 PM  Result Value Ref Range   WBC 9.7 4.0 - 10.5 K/uL   RBC 4.09 3.87 - 5.11 MIL/uL   Hemoglobin 11.6 (L) 12.0 - 15.0 g/dL   HCT 34.9 (L) 36.0 - 46.0 %   MCV 85.3 78.0 - 100.0 fL   MCH 28.4 26.0 - 34.0 pg   MCHC 33.2 30.0 - 36.0 g/dL   RDW 13.5 11.5 - 15.5 %   Platelets 280 150 - 400 K/uL  Type and screen [place]   Collection Time: 03/06/15 11:20 PM  Result Value Ref Range   ABO/RH(D) O POS    Antibody Screen NEG    Sample Expiration 03/09/2015   Glucose, fasting gestational   Collection Time: 03/07/15  5:10 AM  Result Value Ref Range   Glucose, Fasting-Gestational 117 mg/dL  Glucose, 1 hour gestational   Collection Time: 03/07/15  6:40 AM  Result Value Ref Range   Glucose, 1 Hour-Gestational 216 (H) 70 - 189 mg/dL  Glucose, 2 hour gestational   Collection Time: 03/07/15  7:40 AM  Result Value Ref Range   Glucose, 2 Hour-Gestational 184 (H) 70 - 164 mg/dL  Glucose, 3 hour gestational   Collection Time: 03/07/15  9:03 AM  Result Value Ref Range   Glucose, GTT - 3 Hour 101 70 - 144 mg/dL    Medications:  Scheduled . docusate sodium  100 mg Oral Daily  . prenatal multivitamin  1 tablet Oral QHS   I have reviewed  the patient's current medications.  ASSESSMENT: Patient Active Problem List   Diagnosis Date Noted  . Placental abruption 02/14/2015  . Supervision of high risk pregnancy, antepartum 12/24/2014  . Obesity (BMI 30-39.9) 12/24/2014  . Ovarian cyst in pregnancy 12/24/2014  . Complicated migraine Q000111Q    PLAN: NEW DIAGNOSIS:  Pateint is GDM; Diabetic diet and 4x day CBGs; will addmedications as indicated.  Growth [place] q 4 weeks  Monitor for clinical symptoms of abruption  Lorenza Shakir H. 03/07/2015,9:49 PM

## 2015-03-08 DIAGNOSIS — O4593 Premature separation of placenta, unspecified, third trimester: Secondary | ICD-10-CM | POA: Diagnosis not present

## 2015-03-08 LAB — GLUCOSE, CAPILLARY
Glucose-Capillary: 100 mg/dL — ABNORMAL HIGH (ref 65–99)
Glucose-Capillary: 107 mg/dL — ABNORMAL HIGH (ref 65–99)
Glucose-Capillary: 92 mg/dL (ref 65–99)
Glucose-Capillary: 92 mg/dL (ref 65–99)

## 2015-03-08 LAB — CULTURE, BETA STREP (GROUP B ONLY)

## 2015-03-08 MED ORDER — GLYBURIDE 1.25 MG PO TABS
1.2500 mg | ORAL_TABLET | Freq: Every day | ORAL | Status: DC
  Administered 2015-03-08 – 2015-03-19 (×11): 1.25 mg via ORAL
  Filled 2015-03-08 (×11): qty 1

## 2015-03-08 NOTE — Progress Notes (Signed)
Patient ID: Veronica Obrien, female   DOB: [birth year], 34 y.o.   MRN: [id number]  [place]) NOTE  Veronica Obrien is a 34 y.o. G2P1001 at [redacted]w[redacted]d  who is admitted for placental abruption--2nd bleed.   Length of Stay:  2  Days  Subjective: Patient reports the fetal movement as active. Patient reports uterine contraction  activity as none. Patient reports  vaginal bleeding as scant staining. Patient describes fluid per vagina as None.  Vitals:  Blood pressure 110/61, pulse 88, temperature 98.4 F (36.9 C), temperature source Oral, resp. rate 18, height 5\' 8"  (1.727 m), weight 211 lb (95.709 kg), last menstrual period 08/14/2014, SpO2 100 %. Physical Examination: General appearance - alert, well appearing, and in no distress Abdomen - gravid, nontender Extremities - no edema, redness or tenderness in the calves or thighs, Homan's sign negative bilaterally  Fetal Monitoring:  Baseline: 130 bpm, Variability: Good {> 6 bpm), Accelerations: Non-reactive but appropriate for gestational age and Decelerations: Absent  Labs:  Results for orders placed or performed during the hospital encounter of 03/06/15 (from the past 24 hour(s))  Glucose, 3 hour gestational   Collection Time: 03/07/15  9:03 AM  Result Value Ref Range   Glucose, GTT - 3 Hour 101 70 - 144 mg/dL  Glucose, capillary   Collection Time: 03/08/15  7:11 AM  Result Value Ref Range   Glucose-Capillary 100 (H) 65 - 99 mg/dL   Comment 1 Notify RN    Comment 2 Document in Chart     Medications:  Scheduled . docusate sodium  100 mg Oral Daily  . prenatal multivitamin  1 tablet Oral QHS   I have reviewed the patient's current medications.  ASSESSMENT: Patient Active Problem List   Diagnosis Date Noted  . Gestational diabetes mellitus (GDM) affecting pregnancy, antepartum ([place]) 03/07/2015  . Placental abruption 02/14/2015  . Supervision of high risk pregnancy, antepartum 12/24/2014  . Obesity (BMI  30-39.9) 12/24/2014  . Ovarian cyst in pregnancy 12/24/2014  . Complicated migraine Q000111Q    PLAN: Fasting CBG 100 Will see what pp CBGs are on diet. Nutrition and diabetes consult Glybuide 1.25 mg qhs for now; will change based on today's reading as necessary NST reactive and continue 2x day  Walk tid   Luka Stohr H. 03/08/2015,7:53 AM

## 2015-03-08 NOTE — Progress Notes (Signed)
Spoke with patient on the phone. Patient had talked with dietician and had some menus to follow.  Was aware of the range of blood sugars that she needs to meet. States that her father has a blood glucose meter and strips that she can use to check blood sugars. Seemed anxious to get information about gestational diabetes and had received booklet on gestational diabetes from the staff RN. Will continue to follow while in the hospital. Harvel Ricks RN BSN CDE

## 2015-03-08 NOTE — Progress Notes (Signed)
Initial Nutrition Assessment  DOCUMENTATION CODES:   Obesity unspecified  INTERVENTION:  Carbohydrate modified gestational diet Diet education complete  NUTRITION DIAGNOSIS:   Increased nutrient needs related to  (pregnancy and fetal growth requirements) as evidenced by  ([redacted] weeks gestation).   GOAL:   Patient will meet greater than or equal to 90% of their needs (CBG's wnl)  MONITOR:   Labs, Weight trends  REASON FOR ASSESSMENT:   Gestational Diabetes, Antenatal    ASSESSMENT: 29 3/7 weeks, abruption/bleeding. Failed 1 hr and 3 hr GTT. Reports pre-pregnancy weight of 198 lbs, 13 lb weight gain. Weigh gain goals 11-20 lbs   Diet Order:  Diet gestational carb mod Room service appropriate?: Yes; Fluid consistency:: Thin  Skin:  Reviewed, no issues  Height:   Ht Readings from Last 1 Encounters:  03/07/15 5\' 8"  (1.727 m)    Weight:   Wt Readings from Last 1 Encounters:  03/07/15 211 lb (95.709 kg)    Ideal Body Weight:  63.6 kg  BMI:  Body mass index is 32.09 kg/(m^2).  Estimated Nutritional Needs:   Kcal:  2200-2400  Protein:  98-108 g  Fluid:  2.5 L  EDUCATION NEEDS:   Education needs addressed   Nutrition education consult for Carbohydrate Modified Gestational Diabetic Diet completed.  "Meal  plan for gestational diabetics" handout given to patient.  Basic concepts reviewed.  Questions answered.  Patient verbalizes understanding.   Weyman Rodney M.Fredderick Severance LDN Neonatal Nutrition Support Specialist/RD III Pager [phone]      Phone [phone]

## 2015-03-09 DIAGNOSIS — O24913 Unspecified diabetes mellitus in pregnancy, third trimester: Secondary | ICD-10-CM

## 2015-03-09 LAB — GLUCOSE, CAPILLARY
Glucose-Capillary: 91 mg/dL (ref 65–99)
Glucose-Capillary: 86 mg/dL (ref 65–99)
Glucose-Capillary: 104 mg/dL — ABNORMAL HIGH (ref 65–99)
Glucose-Capillary: 123 mg/dL — ABNORMAL HIGH (ref 65–99)

## 2015-03-09 NOTE — Progress Notes (Signed)
Patient ID: Veronica Obrien, female   DOB: Aug 06, 1980, 34 y.o.   MRN: [id number] [place]) NOTE  GENAVIEVE Obrien is a 34 y.o. G2P1001 at [redacted]w[redacted]d by LMP who is admitted for second episode of vaginal bleeding, abruption.   Fetal presentation is unsure. Length of Stay:  3  Days  Subjective: Scant bleeding Patient reports the fetal movement as active. Patient reports uterine contraction  activity as none. Patient reports  vaginal bleeding as scant staining. Patient describes fluid per vagina as None.  Vitals:  Blood pressure 107/64, pulse 87, temperature 97.7 F (36.5 C), temperature source Oral, resp. rate 16, height 5\' 8"  (1.727 m), weight 95.709 kg (211 lb), last menstrual period 08/14/2014, SpO2 100 %. Physical Examination:  General appearance - alert, well appearing, and in no distress Heart - normal rate and regular rhythm Abdomen - soft, nontender, nondistended Fundal Height:  size equals dates Cervical Exam: Not evaluated. Extremities: extremities normal, atraumatic, no cyanosis or edema and Homans sign is negative, no sign of DVT with DTRs 2+ bilaterally Membranes:intact  Fetal Monitoring:      Fetal Heart Rate A     Mode  External filed at 03/09/2015 0948    Baseline Rate (A)  140 bpm filed at 03/09/2015 0948    Variability  6-25 BPM filed at 03/09/2015 0948    Accelerations  15 x 15 filed at 03/09/2015 [id number]    Decelerations  None filed at 03/09/2015 [id number]      Labs:  Results for orders placed or performed during the hospital encounter of 03/06/15 (from the past 24 hour(s))  Glucose, capillary   Collection Time: 03/08/15  3:41 PM  Result Value Ref Range   Glucose-Capillary 92 65 - 99 mg/dL  Glucose, capillary   Collection Time: 03/08/15  8:14 PM  Result Value Ref Range   Glucose-Capillary 92 65 - 99 mg/dL  Glucose, capillary   Collection Time: 03/09/15  7:41 AM  Result Value Ref Range   Glucose-Capillary 91 65 - 99 mg/dL   Glucose, capillary   Collection Time: 03/09/15 10:06 AM  Result Value Ref Range   Glucose-Capillary 86 65 - 99 mg/dL   Comment 1 Notify RN    Comment 2 Document in Chart     Imaging Studies:   Currently EPIC will not allow sonographic studies to automatically populate into notes.  In the meantime, copy and paste results into note or free text.  Medications:  Scheduled . docusate sodium  100 mg Oral Daily  . glyBURIDE  1.25 mg Oral QHS  . prenatal multivitamin  1 tablet Oral QHS   I have reviewed the patient's current medications.  ASSESSMENT: Patient Active Problem List   Diagnosis Date Noted  . Gestational diabetes mellitus (GDM) affecting pregnancy, antepartum ([place]) 03/07/2015  . Placental abruption 02/14/2015  . Supervision of high risk pregnancy, antepartum 12/24/2014  . Obesity (BMI 30-39.9) 12/24/2014  . Ovarian cyst in pregnancy 12/24/2014  . 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

## 2015-03-09 NOTE — Progress Notes (Addendum)
Inpatient Diabetes Program Recommendations  Diabetes Treatment Program Recommendations  ADA Standards of Care 2016 Diabetes in Pregnancy Target Glucose Ranges:  Fasting: 60 - 90 mg/dL Preprandial: 60 - 105 mg/dL 1 hr postprandial: Less than 140mg /dL (from first bite of meal) 2 hr postprandial: Less than 120 mg/dL (from first bit of meal)   Review of Glycemic Control Diabetes Coordinator consult received for GDM and glucose control.  Dietician has seen and Diabetes Coordinator spoke with patient on the phone as well. Patient education materials ordered. Will order education per the system education video network. Patient to practice checking cbg's with hospital meter. Glucose overall much improved with using the GDM meal plan. Ordered [facility] OP education as well with GDM group class-will need an MD co-sign.  Thank you Rosita Kea, RN, MSN, CDE  Diabetes Inpatient Program Office: [phone] Pager: [phone] 8:00 am to 5:00 pm

## 2015-03-10 DIAGNOSIS — O458X3 Other premature separation of placenta, third trimester: Secondary | ICD-10-CM

## 2015-03-10 DIAGNOSIS — O24415 Gestational diabetes mellitus in pregnancy, controlled by oral hypoglycemic drugs: Secondary | ICD-10-CM

## 2015-03-10 LAB — GLUCOSE, CAPILLARY
Glucose-Capillary: 101 mg/dL — ABNORMAL HIGH (ref 65–99)
Glucose-Capillary: 98 mg/dL (ref 65–99)
Glucose-Capillary: 105 mg/dL — ABNORMAL HIGH (ref 65–99)
Glucose-Capillary: 103 mg/dL — ABNORMAL HIGH (ref 65–99)

## 2015-03-10 LAB — TYPE AND SCREEN
ABO/RH(D): O POS
Antibody Screen: NEGATIVE

## 2015-03-10 NOTE — Progress Notes (Signed)
Patient ID: Veronica Obrien, female   DOB: 1981/02/15, 34 y.o.   MRN: [id number] [place]) NOTE  Veronica Obrien is a 34 y.o. G2P1001 at [redacted]w[redacted]d by best clinical estimate who is admitted for vaginal bleeding.   Fetal presentation is cephalic. Length of Stay:  4  Days  Subjective: Doing well, no complaints Patient reports the fetal movement as active. Patient reports uterine contraction  activity as none. Patient reports  vaginal bleeding as scant staining. Patient describes fluid per vagina as None.  Vitals:  Blood pressure 134/81, pulse 101, temperature 98.6 F (37 C), temperature source Oral, resp. rate 20, height 5\' 8"  (1.727 m), weight 211 lb (95.709 kg), last menstrual period 08/14/2014, SpO2 100 %. Physical Examination:  General appearance - alert, well appearing, and in no distress Chest - normal effort Abdomen - gravid, NT Fundal Height:  size equals dates Extremities: Homans sign is negative, no sign of DVT  Membranes:intact  Fetal Monitoring:  Baseline: 135 bpm, Variability: Good {> 6 bpm), Accelerations: Reactive and Decelerations: Absent  Labs:  Results for orders placed or performed during the hospital encounter of 03/06/15 (from the past 24 hour(s))  Glucose, capillary   Collection Time: 03/09/15  7:41 AM  Result Value Ref Range   Glucose-Capillary 91 65 - 99 mg/dL  Glucose, capillary   Collection Time: 03/09/15 10:06 AM  Result Value Ref Range   Glucose-Capillary 86 65 - 99 mg/dL   Comment 1 Notify RN    Comment 2 Document in Chart   Glucose, capillary   Collection Time: 03/09/15  2:27 PM  Result Value Ref Range   Glucose-Capillary 104 (H) 65 - 99 mg/dL   Comment 1 Notify RN    Comment 2 Document in Chart   Glucose, capillary   Collection Time: 03/09/15  7:46 PM  Result Value Ref Range   Glucose-Capillary 123 (H) 65 - 99 mg/dL  Type and screen [place]   Collection Time: 03/09/15 11:05 PM  Result Value  Ref Range   ABO/RH(D) O POS    Antibody Screen NEG    Sample Expiration 03/12/2015   Glucose, capillary   Collection Time: 03/10/15  6:37 AM  Result Value Ref Range   Glucose-Capillary 101 (H) 65 - 99 mg/dL    Medications:  Scheduled . docusate sodium  100 mg Oral Daily  . glyBURIDE  1.25 mg Oral QHS  . prenatal multivitamin  1 tablet Oral QHS   I have reviewed the patient's current medications.  ASSESSMENT: Principal Problem:   Placental abruption Active Problems:   Gestational diabetes mellitus (GDM) affecting pregnancy, antepartum ([place])   PLAN: Continue inpt. Surveillance x 7 days CBG's ok S/p BMZ Delivery with s/sx's of distress  Rashard Ryle S, MD 03/10/2015,7:05 AM

## 2015-03-11 LAB — GLUCOSE, CAPILLARY
Glucose-Capillary: 101 mg/dL — ABNORMAL HIGH (ref 65–99)
Glucose-Capillary: 95 mg/dL (ref 65–99)
Glucose-Capillary: 108 mg/dL — ABNORMAL HIGH (ref 65–99)
Glucose-Capillary: 107 mg/dL — ABNORMAL HIGH (ref 65–99)

## 2015-03-11 NOTE — Progress Notes (Signed)
Patient ID: Veronica Obrien, female   DOB: 1980/10/03, 34 y.o.   MRN: [id number] Patient ID: Veronica Obrien, female   DOB: 1980-04-10, 34 y.o.   MRN: [id number] Veronica Obrien) NOTE  Veronica Obrien is a 34 y.o. G2P1001 at [redacted]w[redacted]d Estimated Date of Delivery: 05/21/15  by best clinical estimate who is admitted for vaginal bleeding due to presumed placental abruption Fetal presentation is cephalic. Length of Stay:  5  Days  Subjective: Doing well, no complaints Patient reports the fetal movement as active. Patient reports uterine contraction  activity as none. Patient reports  vaginal bleeding as scant staining. Patient describes fluid per vagina as None.  Vitals:  Blood pressure 105/67, pulse 80, temperature 98.1 F (36.7 C), temperature source Oral, resp. rate 18, height 5\' 8"  (1.727 m), weight 206 lb 3.2 oz (93.532 kg), last menstrual period 08/14/2014, SpO2 100 %. Physical Examination:  General appearance - alert, well appearing, and in no distress Chest - normal effort Abdomen - gravid, NT Fundal Height:  size equals dates Extremities: Homans sign is negative, no sign of DVT  Membranes:intact  Fetal Monitoring:  Baseline: 135 bpm, Variability: Good {> 6 bpm), Accelerations: Reactive and Decelerations: Absent  Labs:  Results for orders placed or performed during the hospital encounter of 03/06/15 (from the past 24 hour(s))  Glucose, capillary   Collection Time: 03/10/15  6:37 AM  Result Value Ref Range   Glucose-Capillary 101 (H) 65 - 99 mg/dL  Glucose, capillary   Collection Time: 03/10/15 10:07 AM  Result Value Ref Range   Glucose-Capillary 98 65 - 99 mg/dL   Comment 1 Notify RN    Comment 2 Document in Chart   Glucose, capillary   Collection Time: 03/10/15  4:40 PM  Result Value Ref Range   Glucose-Capillary 105 (H) 65 - 99 mg/dL   Comment 1 Notify RN    Comment 2 Document in Chart   Glucose, capillary   Collection Time: 03/10/15  7:58 PM  Result  Value Ref Range   Glucose-Capillary 103 (H) 65 - 99 mg/dL    Medications:  Scheduled . docusate sodium  100 mg Oral Daily  . glyBURIDE  1.25 mg Oral QHS  . prenatal multivitamin  1 tablet Oral QHS   I have reviewed the patient's current medications.  ASSESSMENT: Principal Problem:   Placental abruption Active Problems:   Gestational diabetes mellitus (GDM) affecting pregnancy, antepartum ([place])   PLAN: Continue inpt. Surveillance x 7 days CBG's ok S/p BMZ Delivery with s/sx's of distress  Florian Buff, MD 03/11/2015,6:31 AM

## 2015-03-12 DIAGNOSIS — O458X3 Other premature separation of placenta, third trimester: Secondary | ICD-10-CM | POA: Diagnosis not present

## 2015-03-12 DIAGNOSIS — Z3A30 30 weeks gestation of pregnancy: Secondary | ICD-10-CM

## 2015-03-12 LAB — TYPE AND SCREEN
ABO/RH(D): O POS
Antibody Screen: NEGATIVE

## 2015-03-12 LAB — GLUCOSE, CAPILLARY
Glucose-Capillary: 98 mg/dL (ref 65–99)
Glucose-Capillary: 100 mg/dL — ABNORMAL HIGH (ref 65–99)
Glucose-Capillary: 99 mg/dL (ref 65–99)
Glucose-Capillary: 102 mg/dL — ABNORMAL HIGH (ref 65–99)

## 2015-03-12 NOTE — Progress Notes (Signed)
Patient ID: Veronica Obrien, female   DOB: 08/12/1980, 34 y.o.   MRN: [id number] [place]) NOTE  Veronica Obrien is a 34 y.o. G2P1001 at [redacted]w[redacted]d Estimated Date of Delivery: 05/21/15  by best clinical estimate who is admitted for vaginal bleeding due to presumed placental abruption Fetal presentation is cephalic. Length of Stay:  6  Days  Subjective: Doing well, no complaints. She denies any vaginal bleeding Patient reports the fetal movement as active. Patient reports uterine contraction  activity as none. Patient reports  vaginal bleeding as none. Patient describes fluid per vagina as None.  Vitals:  Blood pressure 109/63, pulse 88, temperature 98.6 F (37 C), temperature source Oral, resp. rate 18, height 5\' 8"  (1.727 m), weight 206 lb 3.2 oz (93.532 kg), last menstrual period 08/14/2014, SpO2 100 %. Physical Examination:  General appearance - alert, well appearing, and in no distress Chest - normal effort Abdomen - gravid, NT Fundal Height:  size equals dates Extremities: Homans sign is negative, no sign of DVT  Membranes:intact  Fetal Monitoring:  Baseline: 130 bpm, Variability: Good {> 6 bpm), Accelerations: Reactive and Decelerations: Absent Toco: no contractions  Labs:  Results for orders placed or performed during the hospital encounter of 03/06/15 (from the past 24 hour(s))  Glucose, capillary   Collection Time: 03/11/15  8:06 AM  Result Value Ref Range   Glucose-Capillary 101 (H) 65 - 99 mg/dL   Comment 1 Notify RN    Comment 2 Document in Chart   Glucose, capillary   Collection Time: 03/11/15 10:37 AM  Result Value Ref Range   Glucose-Capillary 95 65 - 99 mg/dL   Comment 1 Notify RN    Comment 2 Document in Chart   Glucose, capillary   Collection Time: 03/11/15  2:30 PM  Result Value Ref Range   Glucose-Capillary 108 (H) 65 - 99 mg/dL  Glucose, capillary   Collection Time: 03/11/15  9:31 PM  Result Value Ref Range   Glucose-Capillary  107 (H) 65 - 99 mg/dL    Medications:  Scheduled . docusate sodium  100 mg Oral Daily  . glyBURIDE  1.25 mg Oral QHS  . prenatal multivitamin  1 tablet Oral QHS   I have reviewed the patient's current medications.  ASSESSMENT: Principal Problem:   Placental abruption Active Problems:   Gestational diabetes mellitus (GDM) affecting pregnancy, antepartum ([place])   PLAN: Continue inpt. Surveillance x 7 days. Discharge planning on Sunday 12/11 CBG's ok S/p BMZ Delivery with s/sx's of distress  Lilac Hoff, MD 03/12/2015,6:33 AM

## 2015-03-13 LAB — GLUCOSE, CAPILLARY
Glucose-Capillary: 98 mg/dL (ref 65–99)
Glucose-Capillary: 110 mg/dL — ABNORMAL HIGH (ref 65–99)
Glucose-Capillary: 114 mg/dL — ABNORMAL HIGH (ref 65–99)
Glucose-Capillary: 125 mg/dL — ABNORMAL HIGH (ref 65–99)

## 2015-03-13 NOTE — Progress Notes (Signed)
Patient ID: Veronica Obrien, female   DOB: 1980-08-05, 34 y.o.   MRN: [id number] [place]) NOTE  Veronica Obrien is a 34 y.o. G2P1001 at [redacted]w[redacted]d Estimated Date of Delivery: 05/21/15  by best clinical estimate who is admitted for vaginal bleeding due to presumed placental abruption Fetal presentation is cephalic. Length of Stay:  7  Days  Subjective: Doing well, no complaints. She denies any vaginal bleeding Patient reports the fetal movement as active. Patient reports uterine contraction  activity as none. Patient reports  vaginal bleeding as none. Patient describes fluid per vagina as None.  Vitals:  Blood pressure 95/63, pulse 86, temperature 98.3 F (36.8 C), temperature source Oral, resp. rate 18, height 5\' 8"  (1.727 m), weight 206 lb 3.2 oz (93.532 kg), last menstrual period 08/14/2014, SpO2 100 %. Physical Examination: General appearance - alert, well appearing, and in no distress Chest - normal effort Abdomen - gravid, NT Fundal Height:  size equals dates Extremities: Homans sign is negative, no sign of DVT  Membranes:intact  Fetal Monitoring:  Baseline: 130 bpm, Variability: Good {> 6 bpm), Accelerations: Reactive and Decelerations: Absent Toco: no contractions  Labs:  Results for orders placed or performed during the hospital encounter of 03/06/15 (from the past 24 hour(s))  Glucose, capillary   Collection Time: 03/12/15 11:07 AM  Result Value Ref Range   Glucose-Capillary 100 (H) 65 - 99 mg/dL   Comment 1 Notify RN    Comment 2 Document in Chart   Glucose, capillary   Collection Time: 03/12/15  5:09 PM  Result Value Ref Range   Glucose-Capillary 99 65 - 99 mg/dL  Type and screen [place]   Collection Time: 03/12/15  7:54 PM  Result Value Ref Range   ABO/RH(D) O POS    Antibody Screen NEG    Sample Expiration 03/15/2015   Glucose, capillary   Collection Time: 03/12/15  8:22 PM  Result Value Ref Range   Glucose-Capillary 102 (H) 65 - 99 mg/dL  Glucose, capillary   Collection Time: 03/13/15  7:04 AM  Result Value Ref Range   Glucose-Capillary 98 65 - 99 mg/dL    Medications:  Scheduled . docusate sodium  100 mg Oral Daily  . glyBURIDE  1.25 mg Oral QHS  . prenatal multivitamin  1 tablet Oral QHS   I have reviewed the patient's current medications.  ASSESSMENT: Principal Problem:   Placental abruption in third trimester Active Problems:   Gestational diabetes mellitus (GDM) affecting pregnancy, antepartum ([place])   PLAN: Continue inpt. Surveillance x 7 days.  CBG's ok s/p BMZ Delivery with s/sx's of distress Plan to discharge on Sunday 12/11 if no further bleeding  Osama Coleson A, MD 03/13/2015,7:30 AM

## 2015-03-14 LAB — GLUCOSE, CAPILLARY
Glucose-Capillary: 98 mg/dL (ref 65–99)
Glucose-Capillary: 106 mg/dL — ABNORMAL HIGH (ref 65–99)
Glucose-Capillary: 88 mg/dL (ref 65–99)
Glucose-Capillary: 101 mg/dL — ABNORMAL HIGH (ref 65–99)

## 2015-03-14 NOTE — Progress Notes (Signed)
Patient ID: Veronica Obrien, female   DOB: 05/21/1980, 34 y.o.   MRN: [id number] [place]) NOTE  Veronica Obrien is a 34 y.o. G2P1001 at [redacted]w[redacted]d Estimated Date of Delivery: 05/21/15  by best clinical estimate who is admitted for vaginal bleeding due to presumed placental abruption Fetal presentation is cephalic. Length of Stay:  8  Days  Subjective: Had some bright red bleeding yesterday (03/13/15).  Having brown discharge now. Patient reports the fetal movement as active. Patient reports uterine contraction  activity as none. Patient reports  vaginal bleeding as none currently. Patient describes fluid per vagina as none.  Vitals:  Blood pressure 108/69, pulse 84, temperature 98.5 F (36.9 C), temperature source Oral, resp. rate 18, height 5\' 8"  (1.727 m), weight 206 lb 3.2 oz (93.532 kg), last menstrual period 08/14/2014, SpO2 100 %. Physical Examination: General appearance - alert, well appearing, and in no distress Chest - normal effort Abdomen - gravid, NT Fundal Height:  size equals dates Extremities: Homans sign is negative, no sign of DVT  Membranes:intact  Fetal Monitoring:  Baseline: 130 bpm, Variability: Good {> 6 bpm), Accelerations: Reactive and Decelerations: Absent Toco: no contractions  Labs:  Results for orders placed or performed during the hospital encounter of 03/06/15 (from the past 24 hour(s))  Glucose, capillary   Collection Time: 03/13/15  4:34 PM  Result Value Ref Range   Glucose-Capillary 114 (H) 65 - 99 mg/dL   Comment 1 Notify RN   Glucose, capillary   Collection Time: 03/13/15 10:20 PM  Result Value Ref Range   Glucose-Capillary 125 (H) 65 - 99 mg/dL  Glucose, capillary   Collection Time: 03/14/15  6:58 AM  Result Value Ref Range   Glucose-Capillary 98 65 - 99 mg/dL    Medications:  Scheduled . docusate sodium  100 mg Oral Daily  . glyBURIDE  1.25 mg Oral QHS  . prenatal multivitamin  1 tablet Oral QHS   I have  reviewed the patient's current medications.  ASSESSMENT: Principal Problem:   Placental abruption in third trimester Active Problems:   Gestational diabetes mellitus (GDM) affecting pregnancy, antepartum ([place])   PLAN: Continue inpatient surveillance x 7 additional days since last bleed (03/13/15).  CBG's ok s/p BMZ Delivery with s/sx's of distress   Veronica Obrien A, MD 03/14/2015,11:48 AM

## 2015-03-15 ENCOUNTER — Encounter: Payer: Self-pay | Admitting: Family Medicine

## 2015-03-15 LAB — TYPE AND SCREEN
ABO/RH(D): O POS
Antibody Screen: NEGATIVE

## 2015-03-15 LAB — GLUCOSE, CAPILLARY
Glucose-Capillary: 97 mg/dL (ref 65–99)
Glucose-Capillary: 97 mg/dL (ref 65–99)
Glucose-Capillary: 92 mg/dL (ref 65–99)
Glucose-Capillary: 119 mg/dL — ABNORMAL HIGH (ref 65–99)

## 2015-03-15 NOTE — Progress Notes (Signed)
Patient ID: Veronica Obrien, female   DOB: [birth year], 34 y.o.   MRN: [id number] [place]) NOTE  Veronica Obrien is a 34 y.o. G2P1001 at [redacted]w[redacted]d Estimated Date of Delivery: 05/21/15  by best clinical estimate who is admitted for vaginal bleeding due to presumed placental abruption Fetal presentation is cephalic. Length of Stay:  9  Days  Subjective: Had some bright red bleeding on 03/13/15.  Having scant brown discharge now. Patient reports the fetal movement as active. Patient reports uterine contraction  activity as none. Patient reports  vaginal bleeding as none currently. Patient describes fluid per vagina as none.  Vitals:  Blood pressure 95/61, pulse 85, temperature 98 F (36.7 C), temperature source Oral, resp. rate 18, height 5\' 8"  (1.727 m), weight 206 lb 3.2 oz (93.532 kg), last menstrual period 08/14/2014, SpO2 100 %. Physical Examination: General appearance - alert, well appearing, and in no distress Chest - normal effort Abdomen - gravid, NT Fundal Height:  size equals dates Extremities: Homans sign is negative, no sign of DVT  Membranes:intact  Fetal Monitoring:  Baseline: 130 bpm, Variability: Good {> 6 bpm), Accelerations: Reactive and Decelerations: Absent Toco: no contractions  Labs:  Results for orders placed or performed during the hospital encounter of 03/06/15 (from the past 24 hour(s))  Glucose, capillary   Collection Time: 03/14/15 12:09 PM  Result Value Ref Range   Glucose-Capillary 106 (H) 65 - 99 mg/dL  Glucose, capillary   Collection Time: 03/14/15  5:48 PM  Result Value Ref Range   Glucose-Capillary 88 65 - 99 mg/dL  Glucose, capillary   Collection Time: 03/14/15  9:58 PM  Result Value Ref Range   Glucose-Capillary 101 (H) 65 - 99 mg/dL  Glucose, capillary   Collection Time: 03/15/15  6:04 AM  Result Value Ref Range   Glucose-Capillary 97 65 - 99 mg/dL    Medications:  Scheduled . docusate sodium  100 mg Oral Daily   . glyBURIDE  1.25 mg Oral QHS  . prenatal multivitamin  1 tablet Oral QHS   I have reviewed the patient's current medications.  ASSESSMENT: Principal Problem:   Placental abruption in third trimester Active Problems:   Gestational diabetes mellitus (GDM) affecting pregnancy, antepartum ([place])   PLAN: Continue inpatient surveillance x 7 additional days since last bleed (03/13/15).  CBG's ok s/p BMZ Delivery with s/sx's of distress Discharge on 03/20/15 if no further bleeding until then   [place] A, MD 03/15/2015,7:45 AM

## 2015-03-16 DIAGNOSIS — O24919 Unspecified diabetes mellitus in pregnancy, unspecified trimester: Secondary | ICD-10-CM

## 2015-03-16 LAB — GLUCOSE, CAPILLARY
Glucose-Capillary: 113 mg/dL — ABNORMAL HIGH (ref 65–99)
Glucose-Capillary: 108 mg/dL — ABNORMAL HIGH (ref 65–99)
Glucose-Capillary: 107 mg/dL — ABNORMAL HIGH (ref 65–99)
Glucose-Capillary: 79 mg/dL (ref 65–99)

## 2015-03-16 NOTE — Progress Notes (Signed)
Patient ID: Veronica Obrien, female   DOB: 1981/02/21, 34 y.o.   MRN: [id number] [place]) NOTE  Veronica Obrien is a 34 y.o. G2P1001 at [redacted]w[redacted]d Estimated Date of Delivery: 05/21/15  by best clinical estimate who is admitted for vaginal bleeding due to presumed placental abruption Fetal presentation is cephalic. Length of Stay:  10  Days  Subjective: Last bright red bleeding on 03/13/15.  Having scant brown discharge now. Patient reports the fetal movement as active. Patient reports uterine contraction  activity as none. Patient reports  vaginal bleeding as none currently. Patient describes fluid per vagina as none.  Vitals:  Blood pressure 102/66, pulse 83, temperature 98.3 F (36.8 C), temperature source Oral, resp. rate 20, height 5\' 8"  (1.727 m), weight 206 lb 3.2 oz (93.532 kg), last menstrual period 08/14/2014, SpO2 98 %. Physical Examination: General appearance - alert, well appearing, and in no distress Chest - normal effort Abdomen - gravid, NT Fundal Height:  size equals dates Extremities: Homans sign is negative, no sign of DVT  Membranes:intact  Fetal Monitoring:  Baseline: 130 bpm, Variability: Good {> 6 bpm), Accelerations: Reactive and Decelerations: Absent Toco: no contractions  Labs:  Results for orders placed or performed during the hospital encounter of 03/06/15 (from the past 24 hour(s))  Glucose, capillary   Collection Time: 03/15/15  6:04 AM  Result Value Ref Range   Glucose-Capillary 97 65 - 99 mg/dL  Glucose, capillary   Collection Time: 03/15/15  1:09 PM  Result Value Ref Range   Glucose-Capillary 97 65 - 99 mg/dL   Comment 1 Notify RN    Comment 2 Document in Chart   Glucose, capillary   Collection Time: 03/15/15  5:48 PM  Result Value Ref Range   Glucose-Capillary 92 65 - 99 mg/dL   Comment 1 Notify RN    Comment 2 Document in Chart   Type and screen [place]   Collection Time: 03/15/15  6:18 PM   Result Value Ref Range   ABO/RH(D) O POS    Antibody Screen NEG    Sample Expiration 03/18/2015   Glucose, capillary   Collection Time: 03/15/15 10:50 PM  Result Value Ref Range   Glucose-Capillary 119 (H) 65 - 99 mg/dL    Medications:  Scheduled . docusate sodium  100 mg Oral Daily  . glyBURIDE  1.25 mg Oral QHS  . prenatal multivitamin  1 tablet Oral QHS   I have reviewed the patient's current medications.  ASSESSMENT: Principal Problem:   Placental abruption in third trimester Active Problems:   Gestational diabetes mellitus (GDM) affecting pregnancy, antepartum ([place])   PLAN: Continue inpatient surveillance x 7 additional days since last bleed (03/13/15).  CBG's within range s/p BMZ Delivery with s/sx's of distress Discharge on 03/20/15 if no further bleeding until then   [place], [place], DO 03/16/2015,5:04 AM

## 2015-03-17 LAB — GLUCOSE, CAPILLARY
Glucose-Capillary: 94 mg/dL (ref 65–99)
Glucose-Capillary: 95 mg/dL (ref 65–99)
Glucose-Capillary: 113 mg/dL — ABNORMAL HIGH (ref 65–99)
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
## 2015-03-17 NOTE — Progress Notes (Signed)
Patient ID: Veronica Obrien, female   DOB: 1980-10-06, 34 y.o.   MRN: [id number] [place]) NOTE  Veronica Obrien is a 34 y.o. G2P1001 at [redacted]w[redacted]d Estimated Date of Delivery: 05/21/15  by best clinical estimate who is admitted for vaginal bleeding due to presumed placental abruption Fetal presentation is cephalic. Length of Stay:  11  Days  Subjective: Last bright red bleeding on 03/13/15. No further bleeding. Patient reports the fetal movement as active. Patient reports uterine contraction  activity as none. Patient reports  vaginal bleeding as none currently. Patient describes fluid per vagina as none.  Vitals:  Blood pressure 111/69, pulse 92, temperature 98.3 F (36.8 C), temperature source Oral, resp. rate 17, height 5\' 8"  (1.727 m), weight 207 lb 1.6 oz (93.94 kg), last menstrual period 08/14/2014, SpO2 99 %. Physical Examination: General appearance - alert, well appearing, and in no distress Chest - normal effort Abdomen - gravid, NT Fundal Height:  size equals dates Extremities: Homans sign is negative, no sign of DVT  Membranes:intact  Fetal Monitoring:  Baseline: 130 bpm, Variability: Good {> 6 bpm), Accelerations: Reactive and Decelerations: Absent Toco: no contractions  Labs:  Results for orders placed or performed during the hospital encounter of 03/06/15 (from the past 24 hour(s))  Glucose, capillary   Collection Time: 03/16/15 10:31 AM  Result Value Ref Range   Glucose-Capillary 108 (H) 65 - 99 mg/dL   Comment 1 Notify RN    Comment 2 Document in Chart   Glucose, capillary   Collection Time: 03/16/15  6:25 PM  Result Value Ref Range   Glucose-Capillary 107 (H) 65 - 99 mg/dL   Comment 1 Notify RN    Comment 2 Document in Chart   Glucose, capillary   Collection Time: 03/16/15 11:05 PM  Result Value Ref Range   Glucose-Capillary 79 65 - 99 mg/dL  Glucose, capillary   Collection Time: 03/17/15  7:52 AM  Result Value Ref Range   Glucose-Capillary 94 65 - 99 mg/dL   Comment 1 Notify RN    Comment 2 Document in Chart     Medications:  Scheduled . docusate sodium  100 mg Oral Daily  . glyBURIDE  1.25 mg Oral QHS  . prenatal multivitamin  1 tablet Oral QHS   I have reviewed the patient's current medications.  ASSESSMENT: Principal Problem:   Placental abruption in third trimester Active Problems:   Gestational diabetes mellitus (GDM) affecting pregnancy, antepartum ([place])   PLAN: Continue inpatient surveillance x 7 additional days since last bleed (03/13/15).  CBG's within range s/p BMZ Delivery with s/sx's of distress Discharge on 03/20/15 if no further bleeding until then   [facility] A, MD 03/17/2015,9:26 AM

## 2015-03-18 DIAGNOSIS — O4593 Premature separation of placenta, unspecified, third trimester: Secondary | ICD-10-CM

## 2015-03-18 DIAGNOSIS — O3483 Maternal care for other abnormalities of pelvic organs, third trimester: Secondary | ICD-10-CM

## 2015-03-18 DIAGNOSIS — Z87891 Personal history of nicotine dependence: Secondary | ICD-10-CM

## 2015-03-18 DIAGNOSIS — O24429 Gestational diabetes mellitus in childbirth, unspecified control: Secondary | ICD-10-CM

## 2015-03-18 DIAGNOSIS — N83209 Unspecified ovarian cyst, unspecified side: Secondary | ICD-10-CM

## 2015-03-18 DIAGNOSIS — Z3A29 29 weeks gestation of pregnancy: Secondary | ICD-10-CM

## 2015-03-18 LAB — GLUCOSE, CAPILLARY
Glucose-Capillary: 81 mg/dL (ref 65–99)
Glucose-Capillary: 92 mg/dL (ref 65–99)
Glucose-Capillary: 106 mg/dL — ABNORMAL HIGH (ref 65–99)
Glucose-Capillary: 108 mg/dL — ABNORMAL HIGH (ref 65–99)
Glucose-Capillary: 106 mg/dL — ABNORMAL HIGH (ref 65–99)

## 2015-03-18 LAB — TYPE AND SCREEN
ABO/RH(D): O POS
Antibody Screen: NEGATIVE

## 2015-03-18 NOTE — Progress Notes (Signed)
Patient ID: Veronica Obrien, female   DOB: 05/26/1980, 34 y.o.   MRN: [id number] [place]) NOTE  Veronica Obrien is a 34 y.o. G2P1001 at [redacted]w[redacted]d  who is admitted for bleeding with second episode of bleeding due to presumed placental abruption.   Fetal presentation is cephalic. Length of Stay:  12  Days  Subjective: Pt resting comfortably.normal bowel function Patient reports the fetal movement as active. Patient reports uterine contraction  activity as none. Patient reports  vaginal bleeding as none. Patient describes fluid per vagina as None.  Vitals:  Blood pressure 100/59, pulse 81, temperature 98 F (36.7 C), temperature source Oral, resp. rate 17, height 5\' 8"  (1.727 m), weight 93.94 kg (207 lb 1.6 oz), last menstrual period 08/14/2014, SpO2 99 %. Physical Examination:  General appearance - alert, well appearing, and in no distress Heart - normal rate and regular rhythm Abdomen - soft, nontender, nondistended Fundal Height:  size equals dates Cervical Exam: Not evaluated.  and fetal presentation is cephalic and by last u/s. Extremities: extremities normal, atraumatic, no cyanosis or edema and Homans sign is negative, no sign of DVT with DTRs 2+ bilaterally Membranes:intact  Fetal Monitoring:  Bid, normal  Labs:  Results for orders placed or performed during the hospital encounter of 03/06/15 (from the past 24 hour(s))  Glucose, capillary   Collection Time: 03/17/15  7:52 AM  Result Value Ref Range   Glucose-Capillary 94 65 - 99 mg/dL   Comment 1 Notify RN    Comment 2 Document in Chart   Glucose, capillary   Collection Time: 03/17/15 11:03 AM  Result Value Ref Range   Glucose-Capillary 95 65 - 99 mg/dL   Comment 1 Notify RN    Comment 2 Document in Chart   Glucose, capillary   Collection Time: 03/17/15  5:05 PM  Result Value Ref Range   Glucose-Capillary 113 (H) 65 - 99 mg/dL   Comment 1 Notify RN    Comment 2 Document in Chart      Imaging Studies:     Currently EPIC will not allow sonographic studies to automatically populate into notes.  In the meantime, copy and paste results into note or free text.  Medications:  Scheduled . docusate sodium  100 mg Oral Daily  . glyBURIDE  1.25 mg Oral QHS  . prenatal multivitamin  1 tablet Oral QHS   I have reviewed the patient's current medications.  ASSESSMENT: Patient Active Problem List   Diagnosis Date Noted  . Gestational diabetes mellitus (GDM) affecting pregnancy, antepartum (Veronica Obrien) 03/07/2015  . Placental abruption in third trimester 02/14/2015  . Supervision of high risk pregnancy, antepartum 12/24/2014  . Obesity (BMI 30-39.9) 12/24/2014  . Ovarian cyst in pregnancy 12/24/2014  . Complicated migraine [id number]    PLAN: Continue inpatient surveillance x 7 additional days since last bleed (03/13/15).  CBG's within range s/p BMZ Delivery with s/sx's of distress Discharge on 03/20/15 if no further bleeding until then   Veronica Obrien V 03/18/2015,7:01 AM

## 2015-03-19 LAB — GLUCOSE, CAPILLARY
Glucose-Capillary: 92 mg/dL (ref 65–99)
Glucose-Capillary: 93 mg/dL (ref 65–99)
Glucose-Capillary: 83 mg/dL (ref 65–99)
Glucose-Capillary: 90 mg/dL (ref 65–99)

## 2015-03-19 MED ORDER — GLYBURIDE 2.5 MG PO TABS
2.5000 mg | ORAL_TABLET | Freq: Every day | ORAL | Status: DC
  Administered 2015-03-19: 2.5 mg via ORAL
  Filled 2015-03-19: qty 1

## 2015-03-19 NOTE — Progress Notes (Signed)
Patient ID: ELLIEMAE KOLLIE, female   DOB: [birth year], 34 y.o.   MRN: [id number] [place]) NOTE  CHENEY PATMAN is a 34 y.o. G2P1001 at [redacted]w[redacted]d Estimated Date of Delivery: 05/21/15  by best clinical estimate who is admitted for vaginal bleeding due to presumed placental abruption Fetal presentation is cephalic. Length of Stay:  13  Days  Subjective: Last bright red bleeding on 03/13/15.  No active bleeding.  Appetite good.  No other problems.  Has been on glyburide 1.25mg  qhs for GDM.  No difficulty with medications.   Patient reports the fetal movement as active. Patient reports uterine contraction  activity as none. Patient reports  vaginal bleeding as none currently. Patient describes fluid per vagina as none.  Vitals:  Blood pressure 107/70, pulse 82, temperature 98.5 F (36.9 C), temperature source Oral, resp. rate 18, height 5\' 8"  (1.727 m), weight 207 lb 1.6 oz (93.94 kg), last menstrual period 08/14/2014, SpO2 99 %. Physical Examination: General appearance - alert, well appearing, and in no distress Chest - normal effort Abdomen - gravid, NT Fundal Height:  size equals dates Extremities: Homans sign is negative, no sign of DVT  Membranes:intact  Fetal Monitoring:  Baseline: 130 bpm, Variability: Good {> 6 bpm), Accelerations: Reactive and Decelerations: Absent Toco: no contractions  Labs:  Results for orders placed or performed during the hospital encounter of 03/06/15 (from the past 24 hour(s))  Glucose, capillary   Collection Time: 03/18/15 11:13 AM  Result Value Ref Range   Glucose-Capillary 106 (H) 65 - 99 mg/dL   Comment 1 Notify RN    Comment 2 Document in Chart   Glucose, capillary   Collection Time: 03/18/15  5:02 PM  Result Value Ref Range   Glucose-Capillary 108 (H) 65 - 99 mg/dL   Comment 1 Notify RN    Comment 2 Document in Chart   Type and screen [place]   Collection Time: 03/18/15  5:36 PM  Result Value  Ref Range   ABO/RH(D) O POS    Antibody Screen NEG    Sample Expiration 03/21/2015   Glucose, capillary   Collection Time: 03/18/15 11:02 PM  Result Value Ref Range   Glucose-Capillary 106 (H) 65 - 99 mg/dL    Medications:  Scheduled . docusate sodium  100 mg Oral Daily  . glyBURIDE  1.25 mg Oral QHS  . prenatal multivitamin  1 tablet Oral QHS   I have reviewed the patient's current medications.  ASSESSMENT: Principal Problem:   Placental abruption in third trimester Active Problems:   Gestational diabetes mellitus (GDM) affecting pregnancy, antepartum ([place])   PLAN: 1.  Placental Abruption  Continue inpatient surveillance x 7 additional days since last bleed (03/13/15).   s/p BMZ  Delivery with s/sx's of distress  Discharge on 03/20/15 if no further bleeding until then 2.  Gestational Diabetes  Fasting CBG 90-100 over past 3-4 days  Increase Glyburide to 2.5mg  QHS  STINSON, JACOB JEHIEL, DO 03/19/2015,6:43 AM

## 2015-03-19 NOTE — Progress Notes (Signed)
Patient ID: DEONDREA REUM, female   DOB: September 20, 1980, 34 y.o.   MRN: [id number] [place]) NOTE  ARIHANA GOEPFERT is a 34 y.o. G2P1001 at [redacted]w[redacted]d Estimated Date of Delivery: 05/21/15  by best clinical estimate who is admitted for vaginal bleeding due to presumed placental abruption Fetal presentation is cephalic. Length of Stay:  13  Days  Subjective: Last bright red bleeding on 03/13/15.  No active bleeding.  Appetite good.  No other problems Patient reports the fetal movement as active. Patient reports uterine contraction  activity as none. Patient reports  vaginal bleeding as none currently. Patient describes fluid per vagina as none.  Vitals:  Blood pressure 107/70, pulse 82, temperature 98.5 F (36.9 C), temperature source Oral, resp. rate 18, height 5\' 8"  (1.727 m), weight 207 lb 1.6 oz (93.94 kg), last menstrual period 08/14/2014, SpO2 99 %. Physical Examination: General appearance - alert, well appearing, and in no distress Chest - normal effort Abdomen - gravid, NT Fundal Height:  size equals dates Extremities: Homans sign is negative, no sign of DVT  Membranes:intact  Fetal Monitoring:  Baseline: 130 bpm, Variability: Good {> 6 bpm), Accelerations: Reactive and Decelerations: Absent Toco: no contractions  Labs:  Results for orders placed or performed during the hospital encounter of 03/06/15 (from the past 24 hour(s))  Glucose, capillary   Collection Time: 03/18/15 11:13 AM  Result Value Ref Range   Glucose-Capillary 106 (H) 65 - 99 mg/dL   Comment 1 Notify RN    Comment 2 Document in Chart   Glucose, capillary   Collection Time: 03/18/15  5:02 PM  Result Value Ref Range   Glucose-Capillary 108 (H) 65 - 99 mg/dL   Comment 1 Notify RN    Comment 2 Document in Chart   Type and screen [place]   Collection Time: 03/18/15  5:36 PM  Result Value Ref Range   ABO/RH(D) O POS    Antibody Screen NEG    Sample Expiration  03/21/2015   Glucose, capillary   Collection Time: 03/18/15 11:02 PM  Result Value Ref Range   Glucose-Capillary 106 (H) 65 - 99 mg/dL    Medications:  Scheduled . docusate sodium  100 mg Oral Daily  . glyBURIDE  1.25 mg Oral QHS  . prenatal multivitamin  1 tablet Oral QHS   I have reviewed the patient's current medications.  ASSESSMENT: Principal Problem:   Placental abruption in third trimester Active Problems:   Gestational diabetes mellitus (GDM) affecting pregnancy, antepartum ([place])   PLAN: 1.  Placental Abruption  Continue inpatient surveillance x 7 additional days since last bleed (03/13/15).   s/p BMZ  Delivery with s/sx's of distress  Discharge on 03/20/15 if no further bleeding until then 2.  Gestational Diabetes  Fasting CBG 90-100 over past 3-4 days  Increase Glyburide to 2.5mg  QHS  Blaze Nylund JEHIEL, DO 03/19/2015,6:40 AM

## 2015-03-20 LAB — GLUCOSE, CAPILLARY: Glucose-Capillary: 86 mg/dL (ref 65–99)

## 2015-03-20 MED ORDER — GLYBURIDE 2.5 MG PO TABS: 2.5000 mg | ORAL_TABLET | Freq: Every day | ORAL | Status: DC

## 2015-03-20 NOTE — Discharge Instructions (Signed)
Placental Abruption  Your placenta is the organ that nourishes your unborn baby (fetus). Your baby gets his or her blood supply and nutrients through your placenta. It is your baby's life support system. It is attached to the inside of your uterus until after your baby is born.   Placental abruption is when the placenta partly or completely separates from the uterus before your baby is born. This is rare, but it can happen any time after 20 weeks of pregnancy. A small separation may not cause problems, but a large separation may be dangerous for you and your baby.  CAUSES   Most of the time the cause of a placental abruption is unknown. Though it is rare, a placental abruption can be caused by:    An abdominal injury.    The baby turning from a buttocks-first position (breech presentation) or a sideways position (transverse) to a headfirst position (cephalic).    Delivering the first of multiple babies (twins, triplets, or more).    Sudden loss of amniotic fluid (premature rupture of the membranes).    An abnormally short umbilical cord.  RISK FACTORS  Some risk factors make a placental abruption more likely, including:   History of placental abruption.   High blood pressure (hypertension).   Smoking.   Alcohol intake.   Blood clotting problems.   Too much amniotic fluid.   Having had multiples (twins or triplets or more).   Seizures and convulsions.   Diabetes mellitus.   Having had more than four children.   Age 33 years or older.   Illegal drug use.   Injury to your abdomen.  SIGNS AND SYMPTOMS   A small placental abruption may not cause symptoms. If you do have symptoms, they may include:   Mild abdominal pain.   Slight vaginal bleeding.  Symptoms of severe placental abruption depend on the size of the separation and the stage of pregnancy. Symptoms may include:    Sudden pain in your uterus.   Abdominal pain.   Vaginal bleeding.   Tender uterus.   Severe abdominal pain with  tenderness.   Continual contractions of your uterus.   Back pain.   Weakness, light-headedness.  DIAGNOSIS   Placental abruption is suspected when a pregnant woman develops sudden pain in her uterus. The health care provider will check whether the uterus is very tender, hard, and enlarging and whether the baby has an abnormal heart rate or rhythm. Ultrasonography (commonly called an ultrasound) will be done. Blood work will also be done to make sure that there are enough healthy red blood cells and that there are no clotting problems or signs of too much blood loss.  TREATMENT   Placental abruption is usually an emergency. It requires treatment right away. Your treatment will depend on:    The amount of bleeding.   Whether you or you baby are in distress.   The stage of your pregnancy.   The maturity of the baby.  Treatment for partial separation of the placenta is bed rest and close observation. You also may need a blood transfusion or to receive fluids through an IV tube. Treatment for complete placental separation is delivery of your baby. You may have a cesarean delivery if your baby is in distress.  HOME CARE INSTRUCTIONS    Only take medicines as directed by your health care provider.   Arrange for help at home before and after you deliver the baby, especially if you had a cesarean delivery or   lost a lot of blood.   Get plenty of rest and sleep.   Do not have sexual intercourse until your health care provider says it is okay.   Do not use tampons or douche unless your health care provider says it is okay.  SEEK MEDICAL CARE IF:   You have light vaginal bleeding or spotting.   You have any type of trauma, such as a fall or jolt during an accident.   You are having trouble avoiding drugs, alcohol, or smoking.  SEEK IMMEDIATE MEDICAL CARE IF:   You have vaginal bleeding.   You have abdominal pain.   You have continuous uterine contractions.   You have a hard, tender uterus.   You do not feel  the baby move, or the baby moves very little.  MAKE SURE YOU:   Understand these instructions.   Will watch your condition.   Will get help right away if you are not doing well or get worse.     This information is not intended to replace advice given to you by your health care provider. Make sure you discuss any questions you have with your health care provider.     Document Released: 03/20/2005 Document Revised: 03/25/2013 Document Reviewed: 01/10/2013  Elsevier Interactive Patient Education 2016 [employer] Inc[employer]

## 2015-03-20 NOTE — Progress Notes (Signed)
Patient discharged at 1010 per wheelchair with significant other at side. Discharge instructions reviewed per Dr. Elonda Husky earlier and reinforced by myself. Patient to follow up as scheduled and return for s/s of labor, bleeding, decrease in fetal movement or for any concerns to contact provider. Patient aware of medications to take.

## 2015-03-20 NOTE — Discharge Summary (Signed)
Physician Discharge Summary  Patient ID: NALEE KLAPP MRN: [id number] DOB/AGE: 11-24-1980 34 y.o.  Admit date: 03/06/2015 Discharge date: 03/20/2015  Admission Diagnoses: Principal Problem:   Placental abruption in third trimester Active Problems:   Gestational diabetes mellitus (GDM) affecting pregnancy, antepartum [facility])  Discharge Diagnoses:  Principal Problem:   Placental abruption in third trimester Active Problems:   Gestational diabetes mellitus (GDM) affecting pregnancy, antepartum ([place]) A2 diabetes  Discharged Condition: stable  Hospital Course: pt was admitted previously in [place] with a bleeding episode and was discharged after observation She presented again 12/3 with another episode of bleeding and continued to some degree for a week, 12/10 Over the week prior to discharge she had no further bleeding, reassuring fetal surveillance and no other complications She was begun on glyburide for elevated BS  Consults: None  Significant Diagnostic Studies: sonogram  Treatments: observation  Discharge Exam: Blood pressure 99/56, pulse 86, temperature 98.4 F (36.9 C), temperature source Oral, resp. rate 18, height 5\' 8"  (1.727 m), weight 207 lb 1.6 oz (93.94 kg), last menstrual period 08/14/2014, SpO2 99 %. Gen no distress abd soft non tender size equals dates benign  Disposition: 01-Home or Self Care  Discharge Instructions    Ambulatory referral to Nutrition and Diabetic Education    Complete by:  As directed   Newly dx'd GDM-non insulin requiring     Call MD for:    Complete by:  As directed   Vaginal bleeding     Diet - low sodium heart healthy    Complete by:  As directed      Driving Restrictions    Complete by:  As directed   Nope     Increase activity slowly    Complete by:  As directed      Lifting restrictions    Complete by:  As directed   Nope     Sexual Activity Restrictions    Complete by:  As directed   You're joking right!             Medication List    TAKE these medications        acetaminophen 500 MG tablet  Commonly known as:  TYLENOL  Take 1 tablet (500 mg total) by mouth every 6 (six) hours as needed.     docusate sodium 100 MG capsule  Commonly known as:  COLACE  Take 100 mg by mouth at bedtime.     glyBURIDE 2.5 MG tablet  Commonly known as:  DIABETA  Take 1 tablet (2.5 mg total) by mouth at bedtime.     prenatal multivitamin Tabs tablet  Take 1 tablet by mouth at bedtime.           Follow-up Information    Follow up with [facility] On 03/22/2015.   Why:  already scheduled   Contact information:   [place] [phone]      Signed: Florian Buff 03/20/2015, 8:05 AM

## 2015-03-22 ENCOUNTER — Encounter: Payer: Self-pay | Admitting: *Deleted

## 2015-03-22 ENCOUNTER — Ambulatory Visit (INDEPENDENT_AMBULATORY_CARE_PROVIDER_SITE_OTHER): Payer: Medicaid Other | Admitting: Family Medicine

## 2015-03-22 VITALS — BP 115/69 | HR 82 | Temp 98.1°F | Wt 208.6 lb

## 2015-03-22 DIAGNOSIS — O24419 Gestational diabetes mellitus in pregnancy, unspecified control: Secondary | ICD-10-CM

## 2015-03-22 DIAGNOSIS — E669 Obesity, unspecified: Secondary | ICD-10-CM | POA: Diagnosis not present

## 2015-03-22 DIAGNOSIS — O24913 Unspecified diabetes mellitus in pregnancy, third trimester: Secondary | ICD-10-CM

## 2015-03-22 DIAGNOSIS — O99213 Obesity complicating pregnancy, third trimester: Secondary | ICD-10-CM

## 2015-03-22 DIAGNOSIS — O4593 Premature separation of placenta, unspecified, third trimester: Secondary | ICD-10-CM | POA: Diagnosis not present

## 2015-03-22 DIAGNOSIS — O0993 Supervision of high risk pregnancy, unspecified, third trimester: Secondary | ICD-10-CM

## 2015-03-22 LAB — POCT URINALYSIS DIP (DEVICE)
Bilirubin Urine: NEGATIVE
Glucose, UA: NEGATIVE mg/dL
Ketones, ur: NEGATIVE mg/dL
Leukocytes, UA: NEGATIVE
Nitrite: NEGATIVE
Protein, ur: NEGATIVE mg/dL
Specific Gravity, Urine: >=1.03 (ref 1.005–1.030)
Urobilinogen, UA: 0.2 mg/dL (ref 0.0–1.0)
pH: 6 (ref 5.0–8.0)

## 2015-03-22 MED ORDER — GLYBURIDE 2.5 MG PO TABS: 2.5000 mg | ORAL_TABLET | Freq: Two times a day (BID) | ORAL | Status: DC

## 2015-03-22 NOTE — Progress Notes (Signed)
BTL consent reviewed, signed, given to K.Rassett, Licensed conveyancer.

## 2015-03-22 NOTE — Progress Notes (Signed)
Subjective:  Veronica Obrien is a 34 y.o. G2P1001 at [redacted]w[redacted]d being seen today for ongoing prenatal care.  She is currently monitored for the following issues for this high-risk pregnancy and has Complicated migraine; Supervision of high risk pregnancy, antepartum; Obesity (BMI 30-39.9); Ovarian cyst in pregnancy; Placental abruption in third trimester; and Gestational diabetes mellitus (GDM) affecting pregnancy, antepartum ([place]) on her problem list.  Patient reports no complaints.  Contractions: Not present. Vag. Bleeding: None.  Movement: Present. Denies leaking of fluid.   BS values today: Fasting 109, post bfast140  Patient recall of values, did not bring meter  Fasting 109   (109-127) 2hr bfast:  High 90s-116 2hr lunch: 109-126 2hr dinner: 109- 119  The following portions of the patient's history were reviewed and updated as appropriate: allergies, current medications, past family history, past medical history, past social history, past surgical history and problem list. Problem list updated.  Objective:   Filed Vitals:   03/22/15 1103  BP: 115/69  Pulse: 82  Temp: 98.1 F (36.7 C)  Weight: 208 lb 9.6 oz (94.62 kg)    Fetal Status: Fetal Heart Rate (bpm): 130   Movement: Present     General:  Alert, oriented and cooperative. Patient is in no acute distress.  Skin: Skin is warm and dry. No rash noted.   Cardiovascular: Normal heart rate noted  Respiratory: Normal respiratory effort, no problems with respiration noted  Abdomen: Soft, gravid, appropriate for gestational age. Pain/Pressure: Absent     Pelvic: Vag. Bleeding: None     Cervical exam deferred        Extremities: Normal range of motion.  Edema: None  Mental Status: Normal mood and affect. Normal behavior. Normal judgment and thought content.   Urinalysis: Urine Protein: Negative Urine Glucose: Negative  Assessment and Plan:  Pregnancy: G2P1001 at [redacted]w[redacted]d  1. Gestational diabetes mellitus (GDM) affecting pregnancy,  antepartum ([place]) -Increase glyburide to BID given elevated sugars, Rx sent -Discussed importance of bring log/meter to visits  2. Placental abruption in third trimester -stable, no bleeding  3. Supervision of high risk pregnancy, antepartum, third trimester -updated [place]  4. Obesity (BMI 30-39.9)   Preterm labor symptoms and general obstetric precautions including but not limited to vaginal bleeding, contractions, leaking of fluid and fetal movement were reviewed in detail with the patient. Please refer to After Visit Summary for other counseling recommendations.  Return in about 2 weeks (around 04/05/2015) for Routine prenatal care.  Future Appointments Date Time Provider [place]  04/05/2015 9:45 AM Donnamae Jude, MD [facility]   Caren Macadam, MD

## 2015-03-30 ENCOUNTER — Encounter (HOSPITAL_COMMUNITY): Payer: Self-pay | Admitting: *Deleted

## 2015-03-30 ENCOUNTER — Inpatient Hospital Stay (HOSPITAL_COMMUNITY): Payer: Medicaid Other | Admitting: Anesthesiology

## 2015-03-30 ENCOUNTER — Inpatient Hospital Stay (HOSPITAL_COMMUNITY)
Admission: AD | Admit: 2015-03-30 | Discharge: 2015-04-01 | DRG: 775 | Disposition: A | Discharge status: Home / Self Care | Payer: Medicaid Other | Source: Ambulatory Visit | Attending: Family Medicine | Admitting: Family Medicine

## 2015-03-30 ENCOUNTER — Encounter (HOSPITAL_COMMUNITY)
Admission: AD | Disposition: A | Discharge status: Home / Self Care | Payer: Self-pay | Source: Ambulatory Visit | Attending: Family Medicine

## 2015-03-30 DIAGNOSIS — Z8249 Family history of ischemic heart disease and other diseases of the circulatory system: Secondary | ICD-10-CM | POA: Diagnosis not present

## 2015-03-30 DIAGNOSIS — O4590 Premature separation of placenta, unspecified, unspecified trimester: Secondary | ICD-10-CM | POA: Diagnosis present

## 2015-03-30 DIAGNOSIS — Z809 Family history of malignant neoplasm, unspecified: Secondary | ICD-10-CM | POA: Diagnosis not present

## 2015-03-30 DIAGNOSIS — E669 Obesity, unspecified: Secondary | ICD-10-CM | POA: Diagnosis present

## 2015-03-30 DIAGNOSIS — O24419 Gestational diabetes mellitus in pregnancy, unspecified control: Secondary | ICD-10-CM

## 2015-03-30 DIAGNOSIS — O99214 Obesity complicating childbirth: Secondary | ICD-10-CM | POA: Diagnosis present

## 2015-03-30 DIAGNOSIS — Z833 Family history of diabetes mellitus: Secondary | ICD-10-CM | POA: Diagnosis not present

## 2015-03-30 DIAGNOSIS — Z8261 Family history of arthritis: Secondary | ICD-10-CM | POA: Diagnosis not present

## 2015-03-30 DIAGNOSIS — Z87891 Personal history of nicotine dependence: Secondary | ICD-10-CM

## 2015-03-30 DIAGNOSIS — E119 Type 2 diabetes mellitus without complications: Secondary | ICD-10-CM | POA: Diagnosis not present

## 2015-03-30 DIAGNOSIS — Z8632 Personal history of gestational diabetes: Secondary | ICD-10-CM | POA: Diagnosis present

## 2015-03-30 DIAGNOSIS — O4593 Premature separation of placenta, unspecified, third trimester: Secondary | ICD-10-CM

## 2015-03-30 DIAGNOSIS — Z3A32 32 weeks gestation of pregnancy: Secondary | ICD-10-CM

## 2015-03-30 DIAGNOSIS — Z6833 Body mass index (BMI) 33.0-33.9, adult: Secondary | ICD-10-CM

## 2015-03-30 DIAGNOSIS — O24425 Gestational diabetes mellitus in childbirth, controlled by oral hypoglycemic drugs: Secondary | ICD-10-CM | POA: Diagnosis present

## 2015-03-30 DIAGNOSIS — O2412 Pre-existing diabetes mellitus, type 2, in childbirth: Secondary | ICD-10-CM

## 2015-03-30 DIAGNOSIS — Z302 Encounter for sterilization: Secondary | ICD-10-CM | POA: Diagnosis not present

## 2015-03-30 LAB — TYPE AND SCREEN
ABO/RH(D): O POS
Antibody Screen: NEGATIVE

## 2015-03-30 LAB — RAPID URINE DRUG SCREEN, HOSP PERFORMED
Amphetamines: NOT DETECTED
Barbiturates: NOT DETECTED
Benzodiazepines: NOT DETECTED
Cocaine: NOT DETECTED
Opiates: NOT DETECTED
Tetrahydrocannabinol: NOT DETECTED

## 2015-03-30 LAB — CBC
HCT: 36.7 % (ref 36.0–46.0)
Hemoglobin: 12.6 g/dL (ref 12.0–15.0)
MCH: 28.6 pg (ref 26.0–34.0)
MCHC: 34.3 g/dL (ref 30.0–36.0)
MCV: 83.2 fL (ref 78.0–100.0)
Platelets: 294 10*3/uL (ref 150–400)
RBC: 4.41 MIL/uL (ref 3.87–5.11)
RDW: 13.7 % (ref 11.5–15.5)
WBC: 13.9 10*3/uL — ABNORMAL HIGH (ref 4.0–10.5)

## 2015-03-30 LAB — RPR: RPR Ser Ql: NONREACTIVE

## 2015-03-30 LAB — GLUCOSE, CAPILLARY
Glucose-Capillary: 97 mg/dL (ref 65–99)
Glucose-Capillary: 146 mg/dL — ABNORMAL HIGH (ref 65–99)
Glucose-Capillary: 126 mg/dL — ABNORMAL HIGH (ref 65–99)
Glucose-Capillary: 108 mg/dL — ABNORMAL HIGH (ref 65–99)

## 2015-03-30 SURGERY — LIGATION, FALLOPIAN TUBE, POSTPARTUM
Anesthesia: Epidural

## 2015-03-30 MED ORDER — LACTATED RINGERS IV SOLN
INTRAVENOUS | Status: DC
Start: 2015-03-30 — End: 2015-03-30
  Administered 2015-03-30: 02:00:00 via INTRAVENOUS

## 2015-03-30 MED ORDER — BENZOCAINE-MENTHOL 20-0.5 % EX AERO: 1.0000 "application " | INHALATION_SPRAY | CUTANEOUS | Status: DC | PRN

## 2015-03-30 MED ORDER — DIPHENHYDRAMINE HCL 25 MG PO CAPS: 25.0000 mg | ORAL_CAPSULE | Freq: Four times a day (QID) | ORAL | Status: DC | PRN

## 2015-03-30 MED ORDER — IBUPROFEN 600 MG PO TABS
600.0000 mg | ORAL_TABLET | Freq: Four times a day (QID) | ORAL | Status: DC
  Administered 2015-03-30 – 2015-04-01 (×8): 600 mg via ORAL
  Filled 2015-03-30 (×7): qty 1

## 2015-03-30 MED ORDER — PROPOFOL 500 MG/50ML IV EMUL
INTRAVENOUS | Status: AC
  Filled 2015-03-30: qty 50

## 2015-03-30 MED ORDER — FENTANYL CITRATE (PF) 100 MCG/2ML IJ SOLN
INTRAMUSCULAR | Status: AC
  Filled 2015-03-30: qty 2

## 2015-03-30 MED ORDER — OXYCODONE-ACETAMINOPHEN 5-325 MG PO TABS: 1.0000 | ORAL_TABLET | ORAL | Status: DC | PRN

## 2015-03-30 MED ORDER — ZOLPIDEM TARTRATE 5 MG PO TABS: 5.0000 mg | ORAL_TABLET | Freq: Every evening | ORAL | Status: DC | PRN

## 2015-03-30 MED ORDER — SIMETHICONE 80 MG PO CHEW
80.0000 mg | CHEWABLE_TABLET | ORAL | Status: DC | PRN
Start: 2015-03-30 — End: 2015-04-01

## 2015-03-30 MED ORDER — OXYTOCIN 40 UNITS IN LACTATED RINGERS INFUSION - SIMPLE MED
62.5000 mL/h | INTRAVENOUS | Status: DC
  Administered 2015-03-30: 62.5 mL/h via INTRAVENOUS
  Filled 2015-03-30: qty 1000

## 2015-03-30 MED ORDER — PROPOFOL 10 MG/ML IV BOLUS
INTRAVENOUS | Status: AC
  Filled 2015-03-30: qty 20

## 2015-03-30 MED ORDER — DIBUCAINE 1 % RE OINT
1.0000 | TOPICAL_OINTMENT | RECTAL | Status: DC | PRN
Start: 2015-03-30 — End: 2015-04-01

## 2015-03-30 MED ORDER — DIPHENHYDRAMINE HCL 50 MG/ML IJ SOLN: 12.5000 mg | INTRAMUSCULAR | Status: DC | PRN

## 2015-03-30 MED ORDER — MAGNESIUM SULFATE BOLUS VIA INFUSION
4.0000 g | Freq: Once | INTRAVENOUS | Status: AC
  Administered 2015-03-30: 4 g via INTRAVENOUS
  Filled 2015-03-30: qty 500

## 2015-03-30 MED ORDER — MEASLES, MUMPS & RUBELLA VAC ~~LOC~~ INJ
0.5000 mL | INJECTION | Freq: Once | SUBCUTANEOUS | Status: DC
  Filled 2015-03-30: qty 0.5

## 2015-03-30 MED ORDER — ONDANSETRON HCL 4 MG/2ML IJ SOLN
INTRAMUSCULAR | Status: AC
  Filled 2015-03-30: qty 2

## 2015-03-30 MED ORDER — ACETAMINOPHEN 325 MG PO TABS: 650.0000 mg | ORAL_TABLET | ORAL | Status: DC | PRN

## 2015-03-30 MED ORDER — EPHEDRINE 5 MG/ML INJ
10.0000 mg | INTRAVENOUS | Status: DC | PRN
  Filled 2015-03-30: qty 2

## 2015-03-30 MED ORDER — LIDOCAINE HCL (PF) 1 % IJ SOLN
30.0000 mL | INTRAMUSCULAR | Status: DC | PRN
  Filled 2015-03-30: qty 30

## 2015-03-30 MED ORDER — LIDOCAINE HCL (PF) 1 % IJ SOLN
INTRAMUSCULAR | Status: DC | PRN
  Administered 2015-03-30: 6 mL via EPIDURAL
  Administered 2015-03-30: 8 mL via EPIDURAL

## 2015-03-30 MED ORDER — NIFEDIPINE 10 MG PO CAPS: 30.0000 mg | ORAL_CAPSULE | Freq: Three times a day (TID) | ORAL | Status: DC

## 2015-03-30 MED ORDER — SENNOSIDES-DOCUSATE SODIUM 8.6-50 MG PO TABS
2.0000 | ORAL_TABLET | ORAL | Status: DC
  Administered 2015-03-30 – 2015-03-31 (×2): 2 via ORAL
  Filled 2015-03-30 (×2): qty 2

## 2015-03-30 MED ORDER — PHENYLEPHRINE 40 MCG/ML (10ML) SYRINGE FOR IV PUSH (FOR BLOOD PRESSURE SUPPORT)
80.0000 ug | PREFILLED_SYRINGE | INTRAVENOUS | Status: DC | PRN
  Filled 2015-03-30: qty 2
  Filled 2015-03-30: qty 20

## 2015-03-30 MED ORDER — OXYCODONE-ACETAMINOPHEN 5-325 MG PO TABS: 2.0000 | ORAL_TABLET | ORAL | Status: DC | PRN

## 2015-03-30 MED ORDER — FENTANYL 2.5 MCG/ML BUPIVACAINE 1/10 % EPIDURAL INFUSION (WH - ANES)
14.0000 mL/h | INTRAMUSCULAR | Status: DC | PRN
  Administered 2015-03-30: 14 mL/h via EPIDURAL
  Filled 2015-03-30: qty 125

## 2015-03-30 MED ORDER — MAGNESIUM SULFATE 50 % IJ SOLN
2.0000 g/h | INTRAVENOUS | Status: DC
  Administered 2015-03-30: 2 g/h via INTRAVENOUS
  Filled 2015-03-30: qty 80

## 2015-03-30 MED ORDER — PRENATAL MULTIVITAMIN CH: 1.0000 | ORAL_TABLET | Freq: Every day | ORAL | Status: DC

## 2015-03-30 MED ORDER — BETAMETHASONE SOD PHOS & ACET 6 (3-3) MG/ML IJ SUSP
12.0000 mg | Freq: Once | INTRAMUSCULAR | Status: AC
Start: 2015-03-30 — End: 2015-03-30
  Administered 2015-03-30: 12 mg via INTRAMUSCULAR
  Filled 2015-03-30: qty 2

## 2015-03-30 MED ORDER — PRENATAL MULTIVITAMIN CH
1.0000 | ORAL_TABLET | Freq: Every day | ORAL | Status: DC
  Administered 2015-03-30 – 2015-03-31 (×2): 1 via ORAL
  Filled 2015-03-30 (×2): qty 1

## 2015-03-30 MED ORDER — ONDANSETRON HCL 4 MG/2ML IJ SOLN: 4.0000 mg | Freq: Four times a day (QID) | INTRAMUSCULAR | Status: DC | PRN

## 2015-03-30 MED ORDER — OXYTOCIN BOLUS FROM INFUSION: 500.0000 mL | INTRAVENOUS | Status: DC

## 2015-03-30 MED ORDER — CITRIC ACID-SODIUM CITRATE 334-500 MG/5ML PO SOLN: 30.0000 mL | ORAL | Status: DC | PRN

## 2015-03-30 MED ORDER — MIDAZOLAM HCL 2 MG/2ML IJ SOLN
INTRAMUSCULAR | Status: AC
  Filled 2015-03-30: qty 2

## 2015-03-30 MED ORDER — LACTATED RINGERS IV BOLUS (SEPSIS): 1000.0000 mL | Freq: Once | INTRAVENOUS | Status: DC

## 2015-03-30 MED ORDER — PENICILLIN G POTASSIUM 5000000 UNITS IJ SOLR
5.0000 10*6.[IU] | Freq: Once | INTRAVENOUS | Status: DC
  Filled 2015-03-30: qty 5

## 2015-03-30 MED ORDER — LANOLIN HYDROUS EX OINT: TOPICAL_OINTMENT | CUTANEOUS | Status: DC | PRN

## 2015-03-30 MED ORDER — ONDANSETRON HCL 4 MG PO TABS: 4.0000 mg | ORAL_TABLET | ORAL | Status: DC | PRN

## 2015-03-30 MED ORDER — PENICILLIN G POTASSIUM 5000000 UNITS IJ SOLR
2.5000 10*6.[IU] | INTRAMUSCULAR | Status: DC
  Administered 2015-03-30: 2.5 10*6.[IU] via INTRAVENOUS
  Filled 2015-03-30 (×6): qty 2.5

## 2015-03-30 MED ORDER — LACTATED RINGERS IV SOLN: 500.0000 mL | INTRAVENOUS | Status: DC | PRN

## 2015-03-30 MED ORDER — DEXAMETHASONE SODIUM PHOSPHATE 4 MG/ML IJ SOLN
INTRAMUSCULAR | Status: AC
  Filled 2015-03-30: qty 1

## 2015-03-30 MED ORDER — TETANUS-DIPHTH-ACELL PERTUSSIS 5-2.5-18.5 LF-MCG/0.5 IM SUSP: 0.5000 mL | Freq: Once | INTRAMUSCULAR | Status: DC

## 2015-03-30 MED ORDER — ONDANSETRON HCL 4 MG/2ML IJ SOLN: 4.0000 mg | INTRAMUSCULAR | Status: DC | PRN

## 2015-03-30 MED ORDER — WITCH HAZEL-GLYCERIN EX PADS: 1.0000 "application " | MEDICATED_PAD | CUTANEOUS | Status: DC | PRN

## 2015-03-30 MED ORDER — LIDOCAINE HCL (CARDIAC) 20 MG/ML IV SOLN
INTRAVENOUS | Status: AC
  Filled 2015-03-30: qty 5

## 2015-03-30 NOTE — MAU Note (Signed)
Pt reports contractions since 8 pm, some pink on tissue. Was here for 3 weeks, has an abruption.

## 2015-03-30 NOTE — H&P (Signed)
LABOR ADMISSION HISTORY AND PHYSICAL  Veronica Obrien is a 34 y.o. female G2P1001 with IUP at [redacted]w[redacted]d presenting for regular contractions. Patient's hx complicated by placental abruption in November. Patient also with A2GD, she takes glyburide for this. She reports +FM, No LOF, no VB, no blurry vision, headaches or peripheral edema, and RUQ pain.  She plans on breast feeding. She request BTL for birth control.  Dating: By LMP/15 week u/s --->  Estimated Date of Delivery: 05/21/15  Sono:    @[redacted]w[redacted]d , CWD, normal anatomy, Cephalic presentation, 49 % EFW   Prenatal History/Complications:  Gestational Diabetes - taking glyburide during pregnancy  GBS negative,  Placental Abruption during this pregnancy 11/14   Clinic  [facility] Prenatal Labs  Dating  LMP c/w 15 ultrasound Blood type:   O pos  Genetic Screen Quad:  Neg Antibody: neg  Anatomic [place]  Normal Rubella:  Immune  GTT Early: 60              Third trimester: 137 3 hour- RPR:   NR  Flu vaccine  declined HBsAg:   negative  TDaP vaccine                                               Rhogam: HIV:   NR  Baby Food  breast                                         GBS: (For PCN allergy, check sensitivities)  Contraception  Desires BTL Pap: negative (12/24/2014)  Circumcision  yes   Pediatrician    Support Person      Past Medical History: Past Medical History  Diagnosis Date  . Medical history non-contributory     Past Surgical History: Past Surgical History  Procedure Laterality Date  . Wisdom tooth extraction      Obstetrical History: OB History    Gravida Para Term Preterm AB TAB SAB Ectopic Multiple Living   2 1 1  0 0 0 0 0 0 1      Social History: Social History   Social History  . Marital Status: Single    Spouse Name: N/A  . Number of Children: N/A  . Years of Education: N/A   Social History Main Topics  . Smoking status: Former Smoker -- 0.00 packs/day    Types: Cigarettes    Quit date: 06/06/2014  . Smokeless  tobacco: Never Used  . Alcohol Use: No     Comment: NOT SINCE PREGNANCY  . Drug Use: No  . Sexual Activity: Yes   Other Topics Concern  . None   Social History Narrative    Family History: Family History  Problem Relation Age of Onset  . Arthritis Father   . Cancer Sister   . Heart disease Maternal Grandfather   . Diabetes Maternal Grandfather   . Diabetes Paternal Grandmother   . Cancer Paternal Grandmother   . Diabetes Paternal Grandfather     Allergies: Allergies  Allergen Reactions  . Novocain [Procaine Hcl] Other (See Comments)    Pt states that it makes her try to bite her tongue.      Prescriptions prior to admission  Medication Sig Dispense Refill Last Dose  . acetaminophen (TYLENOL) 500 MG tablet Take 1  tablet (500 mg total) by mouth every 6 (six) hours as needed. (Patient taking differently: Take 500 mg by mouth every 6 (six) hours as needed for mild pain or headache. ) 30 tablet 0 Taking  . docusate sodium (COLACE) 100 MG capsule Take 100 mg by mouth at bedtime.    Taking  . glyBURIDE (DIABETA) 2.5 MG tablet Take 1 tablet (2.5 mg total) by mouth 2 (two) times daily with a meal. 60 tablet 11   . Prenatal Vit-Fe Fumarate-FA (PRENATAL MULTIVITAMIN) TABS tablet Take 1 tablet by mouth at bedtime.    Taking     Review of Systems   All systems reviewed and negative except as stated in HPI  BP 117/79 mmHg  Pulse 88  Temp(Src) 98.2 F (36.8 C) (Oral)  Resp 16  Ht 5\' 6"  (1.676 m)  Wt 207 lb (93.895 kg)  BMI 33.43 kg/m2  SpO2 100%  LMP 08/14/2014 General appearance: alert and cooperative Lungs: clear to auscultation bilaterally Heart: regular rate and rhythm Abdomen: soft, non-tender; bowel sounds normal Extremities: Homans sign is negative, no sign of DVT, edema Presentation: cephalic by u/s Fetal monitoringBaseline: 135 bpm, Variability: Good {> 6 bpm) and Accelerations: Reactive Uterine activityFrequency: Every 3-5 minutes    Prenatal labs: ABO, Rh:  --/--/O POS (12/15 1736) Antibody: NEG (12/15 1736) Rubella: Immune  RPR: NON REAC (11/28 1058)  HBsAg: NEGATIVE (09/22 1008)  HIV: NONREACTIVE (11/28 1058)  GBS: Negative (12/03 0000)  1 hr Glucola 127  Genetic screening  Normal Anatomy US Normal  Prenatal Transfer Tool  Maternal Diabetes: Yes:  Diabetes Type:  Insulin/Medication controlled Glyburide  Genetic Screening: Normal Maternal Ultrasounds/Referrals: Normal Fetal Ultrasounds or other Referrals:  None Maternal Substance Abuse:  No Significant Maternal Medications:  Meds include: Other: Glyburide  Significant Maternal Lab Results: Lab values include: Other: GBS unknown   Results for orders placed or performed during the hospital encounter of 03/30/15 (from the past 24 hour(s))  Glucose, capillary   Collection Time: 03/30/15  1:14 AM  Result Value Ref Range   Glucose-Capillary 97 65 - 99 mg/dL    Patient Active Problem List   Diagnosis Date Noted  . Gestational diabetes mellitus (GDM) affecting pregnancy, antepartum ([place]) 03/07/2015  . Placental abruption in third trimester 02/14/2015  . Supervision of high risk pregnancy, antepartum 12/24/2014  . Obesity (BMI 30-39.9) 12/24/2014  . Ovarian cyst in pregnancy 12/24/2014  . Complicated migraine [id number]    Assessment: Veronica Obrien is a 34 y.o. G2P1001 at [redacted]w[redacted]d here for Preterm Labor.   #Labor: Expectant Management, Mag  # Preterm: BMZ x1  and Magnesium neuro-protection and tocolytic  #Pain: Not sure if wants Epidural  #FWB: Category 1  #ID:  GBS negative 12/03, however will treat with PCN due to preterm status  #MOF: Breast  #MOC:BTL ? Possibly  #Circ:  Outpatient Circ  # A2GD, on glyburide: Will get Q4H CBGs # Hx of Placental Abruption: Continue to closely monitor FHT    Kerrin Mo, MD

## 2015-03-30 NOTE — H&P (View-Only) (Signed)
Asked by Dr.Constant to provide prenatal consultation for 34 yo G2 P1 mother with gestational DM on glyburide who was admitted today in preterm labor at 32.[redacted] wks EGA. Pregnancy complicated by placental abruption and she was admitted for a week in November and treated with BMZ x 2.  Also readmitted 12/3 - 12/17 after episode of bleeding.  Now presents with UCs, has been given BMZ today and Mag neuroprophylaxis started.  Also on PCN pending GBS results. Membranes intact.  Discussed usual expectations for preterm infant at [redacted] weeks gestation, including possible needs for DR resuscitation, respiratory support, IV access, and temp support. Projected possible length of stay in NICU until 36 - [redacted] wks EGA.  Discussed advantages of feeding with mother's milk.  She plans to pump postnatally.  Patient was attentive, had appropriate questions, and was appreciative of my input.  Thank you for consulting Neonatology.  Total time 25 minutes Face-to-face time 15 minutes

## 2015-03-30 NOTE — Anesthesia Preprocedure Evaluation (Signed)
Anesthesia Evaluation  Patient identified by MRN, date of birth, ID band Patient awake    Reviewed: Allergy & Precautions, H&P , NPO status , Patient's Chart, lab work & pertinent test results  Airway Mallampati: II  TM Distance: >3 FB Neck ROM: full    Dental no notable dental hx.    Pulmonary former smoker,    Pulmonary exam normal        Cardiovascular negative cardio ROS Normal cardiovascular exam     Neuro/Psych negative psych ROS   GI/Hepatic negative GI ROS, Neg liver ROS,   Endo/Other  negative endocrine ROS  Renal/GU negative Renal ROS     Musculoskeletal   Abdominal (+) + obese,   Peds  Hematology negative hematology ROS (+)   Anesthesia Other Findings   Reproductive/Obstetrics (+) Pregnancy                             Anesthesia Physical Anesthesia Plan  ASA: II  Anesthesia Plan: Epidural   Post-op Pain Management:    Induction:   Airway Management Planned:   Additional Equipment:   Intra-op Plan:   Post-operative Plan:   Informed Consent: I have reviewed the patients History and Physical, chart, labs and discussed the procedure including the risks, benefits and alternatives for the proposed anesthesia with the patient or authorized representative who has indicated his/her understanding and acceptance.     Plan Discussed with:   Anesthesia Plan Comments:         Anesthesia Quick Evaluation

## 2015-03-30 NOTE — Lactation Note (Signed)
This note was copied from the chart of [place]. Lactation Consultation Note  Patient Name: Boy Penni Kabacinski [id number] Date: 03/30/2015 Reason for consult: Initial assessment Baby at 1 hr of life. Baby in NICU, born at 32+4. Mom going for BTL and wanted to pump now. Mom stated that she did better with the [place] with her 34 yr old but agreed to try the DEBP. She said the hospital pump felt much better than what she remembered and would continue to use it. Given #30 flange because the #27 was pinching. Mom was able to get 32 ml total. Milk was labeled and taken to the NICU by the [facility]. Discussed pumping frequency, breast changes, and nipple care. Given lactation handouts. Aware of OP services and support group. Mom will call for bf help as needed.     Maternal Data Has patient been taught Hand Expression?: Yes Does the patient have breastfeeding experience prior to this delivery?: Yes  Feeding    LATCH Score/Interventions                      Lactation Tools Discussed/Used WIC Program: Yes Pump Review: Setup, frequency, and cleaning;Milk Storage Initiated by:: ES Date initiated:: 03/30/15   Consult Status Consult Status: Follow-up Date: 03/31/15 Follow-up type: In-patient    Denzil Hughes 03/30/2015, 1:48 PM

## 2015-03-30 NOTE — Interval H&P Note (Signed)
History and Physical Interval Note:  03/30/2015 2:00 PM  Veronica Obrien  has presented today for surgery following SVD, with the diagnosis of Desires Sterilization  The various methods of treatment have been discussed with the patient and family. After consideration of risks, benefits and other options for treatment, the patient has consented to  Procedure(s): POST PARTUM TUBAL LIGATION (N/A) as a surgical intervention .  The patient's history has been reviewed, patient examined, no change in status, stable for surgery.  I have reviewed the patient's chart and labs.  Questions were answered to the patient's satisfaction.     [place]

## 2015-03-30 NOTE — Anesthesia Preprocedure Evaluation (Signed)
Anesthesia Evaluation  Patient identified by MRN, date of birth, ID band Patient awake    Reviewed: Allergy & Precautions, NPO status , Patient's Chart, lab work & pertinent test results  History of Anesthesia Complications Negative for: history of anesthetic complications  Airway Mallampati: II  TM Distance: >3 FB Neck ROM: Full    Dental no notable dental hx. (+) Dental Advisory Given   Pulmonary former smoker,    Pulmonary exam normal breath sounds clear to auscultation       Cardiovascular negative cardio ROS Normal cardiovascular exam Rhythm:Regular Rate:Normal     Neuro/Psych negative neurological ROS  negative psych ROS   GI/Hepatic negative GI ROS, Neg liver ROS,   Endo/Other  diabetesobesity  Renal/GU negative Renal ROS  negative genitourinary   Musculoskeletal negative musculoskeletal ROS (+)   Abdominal   Peds negative pediatric ROS (+)  Hematology negative hematology ROS (+)   Anesthesia Other Findings   Reproductive/Obstetrics negative OB ROS                             Anesthesia Physical Anesthesia Plan  ASA: II  Anesthesia Plan: Epidural   Post-op Pain Management:    Induction:   Airway Management Planned:   Additional Equipment:   Intra-op Plan:   Post-operative Plan:   Informed Consent: I have reviewed the patients History and Physical, chart, labs and discussed the procedure including the risks, benefits and alternatives for the proposed anesthesia with the patient or authorized representative who has indicated his/her understanding and acceptance.   Dental advisory given  Plan Discussed with: CRNA  Anesthesia Plan Comments:         Anesthesia Quick Evaluation
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
## 2015-03-30 NOTE — Anesthesia Procedure Notes (Signed)
Epidural Patient location during procedure: OB Start time: 03/30/2015 4:57 AM End time: 03/30/2015 5:07 AM  Staffing Anesthesiologist: Lyn Hollingshead Performed by: anesthesiologist   Preanesthetic Checklist Completed: patient identified, surgical consent, pre-op evaluation, timeout performed, IV checked, risks and benefits discussed and monitors and equipment checked  Epidural Patient position: sitting Prep: site prepped and draped and DuraPrep Patient monitoring: continuous pulse ox and blood pressure Approach: midline Location: L3-L4 Injection technique: LOR air  Needle:  Needle type: Tuohy  Needle gauge: 17 G Needle length: 9 cm and 9 Needle insertion depth: 7 cm Catheter type: closed end flexible Catheter size: 19 Gauge Catheter at skin depth: 12 cm Test dose: negative and Other  Assessment Sensory level: T9 Events: blood not aspirated, injection not painful, no injection resistance, negative IV test and no paresthesia  Additional Notes Reason for block:procedure for pain

## 2015-03-30 NOTE — Lactation Note (Addendum)
This note was copied from the chart of [place]. Lactation Consultation Note  Patient Name: Veronica Obrien CHYIF'O Date: 03/30/2015 Reason for consult: Follow-up assessment;NICU baby   NICU baby 4 hours old, 12w4dGA. Mom has already pumped and sent EBM to NICU because she had intended to have BLT. However, mom changed her mind about BTL, but still only wants baby to receive EBM and not formula. Mom given NICU booklet with review, yellow dots, and additional bottles for milk collection. Mom aware of pumping rooms in NICU. [facility]. Mom requested a hand pump and stated that she preferred to use with last baby, but is finding the DEBP to be comfortable. Discussed the benefits of using DEBP with NICU baby and pumping both breast simultaneously for improved milk supply long-term, especially given mom's GDM. Mom had a good supply with first child, but baby nursed immediately after birth, and this baby in NICU. Mom states that she had mastitis with first child. Discussed engorgement prevention/treatment, and enc using hand pump as needed. Discussed making sure breasts remain soft, and inspecting well after each pumping. Mom aware of how to use piston in pumping kit as well. Discussed converting a sports bra for hands-free pumping too.   Maternal Data Has patient been taught Hand Expression?: Yes Does the patient have breastfeeding experience prior to this delivery?: Yes  Feeding    LATCH Score/Interventions                      Lactation Tools Discussed/Used WIC Program: Yes Pump Review: Setup, frequency, and cleaning;Milk Storage Initiated by:: ES Date initiated:: 03/30/15   Consult Status Consult Status: Follow-up Date: 03/31/15 Follow-up type: In-patient    WInocente Salles12/27/2016, 4:58 PM

## 2015-03-30 NOTE — Consult Note (Signed)
Asked by Dr.Constant to provide prenatal consultation for 34 yo G2 P1 mother with gestational DM on glyburide who was admitted today in preterm labor at 32.[redacted] wks EGA. Pregnancy complicated by placental abruption and she was admitted for a week in November and treated with BMZ x 2.  Also readmitted 12/3 - 12/17 after episode of bleeding.  Now presents with UCs, has been given BMZ today and Mag neuroprophylaxis started.  Also on PCN pending GBS results. Membranes intact.  Discussed usual expectations for preterm infant at [redacted] weeks gestation, including possible needs for DR resuscitation, respiratory support, IV access, and temp support. Projected possible length of stay in NICU until 36 - [redacted] wks EGA.  Discussed advantages of feeding with mother's milk.  She plans to pump postnatally.  Patient was attentive, had appropriate questions, and was appreciative of my input.  Thank you for consulting Neonatology.  Total time 25 minutes Face-to-face time 15 minutes

## 2015-03-30 NOTE — Progress Notes (Signed)
Veronica Obrien is a 34 y.o. G2P1001 admitted for abruption and preterm labor Subjective: Feels contractions, no rectal pressure  Objective: BP 117/61 mmHg  Pulse 85  Temp(Src) 97.4 F (36.3 C) (Axillary)  Resp 18  Ht 5\' 6"  (1.676 m)  Wt 207 lb (93.895 kg)  BMI 33.43 kg/m2  SpO2 99%  LMP 08/14/2014   Total I/O In: 660 [P.O.:310; I.V.:350] Out: 1500 [Urine:1500]   AROM clear Head well applied to cervix.  FHT:  FHR: 120 bpm, variability: moderate,  accelerations:  Present,  decelerations:  Absent UC:   regular, every 2-4 minutes SVE:  10/100/0 Labs: Lab Results  Component Value Date   WBC 13.9* 03/30/2015   HGB 12.6 03/30/2015   HCT 36.7 03/30/2015   MCV 83.2 03/30/2015   PLT 294 03/30/2015    Assessment / Plan: Spontaneous labor, progressing normally  Labor: Progressing normally Preeclampsia:  no signs or symptoms of toxicity Fetal Wellbeing:  Category I Pain Control:  Epidural I/D:  n/a Anticipated MOD:  NSVD  Rasha Ibe H. 03/30/2015, 11:03 AM

## 2015-03-31 LAB — GLUCOSE, CAPILLARY: Glucose-Capillary: 114 mg/dL — ABNORMAL HIGH (ref 65–99)

## 2015-03-31 NOTE — Lactation Note (Signed)
This note was copied from the chart of [place]. Lactation Consultation Note  Patient Name: Veronica Obrien [id number] Date: 03/31/2015 Reason for consult: Follow-up assessment;NICU baby NICU baby 95 hours old. Mom in NICU holding baby STS and pumping at bedside. Mom also has swabs of EBM that she is rubbing inside baby's mouth--and baby is tolerating very well. Baby peacefully lying on mom's chest, smiling, and mom's colostrum is flowing. Gave a cursory review of Cue-based Feeding in the NICU handout and left with MOB to review more thoroughly. Mom states that she understands baby cannot go to breast until PT determines baby's readiness. Mom states that she has all needed supplies in her room. Enc mom to call [place] office today to inquire about DEBP. Discussed with mom that they probably called her today but couldn't reach her.   Maternal Data    Feeding    LATCH Score/Interventions                      Lactation Tools Discussed/Used     Consult Status Consult Status: Follow-up Date: 04/01/15 Follow-up type: In-patient    Inocente Salles 03/31/2015, 10:31 AM

## 2015-03-31 NOTE — Progress Notes (Signed)
Post Partum Day 1 Subjective: no complaints, up ad lib, voiding and tolerating PO  Objective: Blood pressure 120/60, pulse 76, temperature 97.5 F (36.4 C), temperature source Oral, resp. rate 18, height 5\' 6"  (1.676 m), weight 207 lb (93.895 kg), last menstrual period 08/14/2014, SpO2 99 %, unknown if currently breastfeeding.  Physical Exam:  General: alert, cooperative and no distress Lochia: appropriate Uterine Fundus: firm Incision: n/a  DVT Evaluation: No evidence of DVT seen on physical exam.   Recent Labs  03/30/15 0200  HGB 12.6  HCT 36.7   CBG (last 3)   Recent Labs  03/30/15 0934 03/30/15 1336 03/31/15 0609  GLUCAP 126* 108* 114*      Assessment/Plan: Gest DM with normal pp CBG off glyburide Plan for discharge tomorrow   LOS: 1 day   ARNOLD,JAMES 03/31/2015, 1:00 PM

## 2015-03-31 NOTE — Anesthesia Postprocedure Evaluation (Signed)
Anesthesia Post Note  Patient: Veronica Obrien  Procedure(s) Performed: * No procedures listed *  Patient location during evaluation: [facility] Anesthesia Type: Epidural Level of consciousness: awake and alert and oriented Pain management: pain level controlled Vital Signs Assessment: post-procedure vital signs reviewed and stable Respiratory status: spontaneous breathing and respiratory function stable Cardiovascular status: blood pressure returned to baseline Postop Assessment: no backache, no headache, patient able to bend at knees, no signs of nausea or vomiting and adequate PO intake Anesthetic complications: no    Last Vitals:  Filed Vitals:   03/30/15 1617 03/30/15 2129  BP: 110/74 96/62  Pulse: 86 70  Temp: 36.8 C 36.4 C  Resp: 20 20    Last Pain:  Filed Vitals:   03/31/15 0640  PainSc: Asleep                 Zahriah Roes

## 2015-04-01 NOTE — Lactation Note (Signed)
This note was copied from the chart of [place]. NICU baby 32 hoursLactation Consultation Note  Patient Name: Boy Alysa Chrysler [id number] Date: 04/01/2015 Reason for consult: Follow-up assessment;NICU baby NICU baby 72 hours old. Mom reports that she is becoming engorged. Fixed ice bags for mom and enc her to use 10-15 minutes on and then off for 10-15 minutes. Discussed using ice, then massaging breasts, and then hand expressing. Enc mom to keep milk moving for comfort and to protect her supply. Mom is getting at least an ounce when she pumps of transitional milk. Mom's left nipple is larger than her right, and it does not evert well. Mom able to get EBM to flow better from right breast, so fitted mom with a #37 flange, but then the milk no longer flowing and she was at the end of her 15 minutes of pumping. Enc mom to use whichever flange was most effective and comfortable. Discussed that flange size needed may change after this engorgement phase ends--probably in 24-48 hours. Discouraged using heat, especially letting hot shower water flow on breast.   Mom aware of OP/BFSG and [facility] phone line assistance after D/C. Reviewed EBM storage guidelines and how to transport milk from home to the NICU.     Maternal Data    Feeding Feeding Type: Breast Milk Length of feed: 30 min  LATCH Score/Interventions                      Lactation Tools Discussed/Used     Consult Status Consult Status: PRN    Inocente Salles 04/01/2015, 12:12 PM

## 2015-04-01 NOTE — Progress Notes (Signed)
CSW attempted to meet with MOB in her third floor room/306 to introduce services, offer support, and complete assessment due to baby's admission to NICU at 32.4 weeks, but she was not in her room at this time. CSW will attempt again at a later time.

## 2015-04-01 NOTE — Discharge Summary (Signed)
OB Discharge Summary     Patient Name: Veronica Obrien DOB: Sep 13, 1980 MRN: [id number]  Date of admission: 03/30/2015 Delivering MD: Donnamae Jude   Date of discharge: 04/01/2015  Admitting diagnosis: 17 WEEKS CTX Desires Sterilization Intrauterine pregnancy: [redacted]w[redacted]d     Secondary diagnosis:  Active Problems:   Gestational diabetes mellitus (GDM) affecting pregnancy, antepartum ([place])   Active preterm labor  Additional problems: premature delivery      Discharge diagnosis: Preterm Pregnancy Delivered and GDM A2                                                                                                Post partum procedures:n/a  Augmentation: n/a  Complications: None  Hospital course:  Onset of Labor With Vaginal Delivery     34 y.o. yo [id number] at [redacted]w[redacted]d was admitted in Latent Laboron 03/30/2015. Patient had an uncomplicated labor course as follows:  Membrane Rupture Time/Date: 11:00 AM ,03/30/2015   Intrapartum Procedures: Episiotomy: None [1]                                         Lacerations:  None [1]  Patient had a delivery of a Viable infant. 03/30/2015  Information for the patient's newborn:  Ivah, [place] [id number]  Delivery Method: Vaginal, Spontaneous Delivery (Filed from Delivery Summary)    Pateint had an uncomplicated postpartum course.  She is ambulating, tolerating a regular diet, passing flatus, and urinating well. Patient is discharged home in stable condition on No discharge date for patient encounter.Marland Kitchen    Physical exam  Filed Vitals:   03/31/15 1257 03/31/15 1743 03/31/15 2152 04/01/15 0637  BP: 120/60 117/67 105/63 106/64  Pulse: 76 84 83 67  Temp: 97.5 F (36.4 C) 98.3 F (36.8 C) 97.9 F (36.6 C) 97.9 F (36.6 C)  TempSrc: Oral Oral Oral Oral  Resp: 18 18 18 18   Height:      Weight:      SpO2: 99% 98% 100% 100%   General: alert, cooperative and no distress Lochia: appropriate Uterine Fundus: firm Incision: N/A DVT Evaluation:  No evidence of DVT seen on physical exam. Labs: Lab Results  Component Value Date   WBC 13.9* 03/30/2015   HGB 12.6 03/30/2015   HCT 36.7 03/30/2015   MCV 83.2 03/30/2015   PLT 294 03/30/2015   CMP Latest Ref Rng 02/14/2015  Glucose 65 - 99 mg/dL 121(H)  BUN 6 - 20 mg/dL 8  Creatinine 0.44 - 1.00 mg/dL 0.64  Sodium 135 - 145 mmol/L 138  Potassium 3.5 - 5.1 mmol/L 3.9  Chloride 101 - 111 mmol/L 109  CO2 22 - 32 mmol/L 21(L)  Calcium 8.9 - 10.3 mg/dL 9.5  Total Protein 6.5 - 8.1 g/dL 6.5  Total Bilirubin 0.3 - 1.2 mg/dL 0.3  Alkaline Phos 38 - 126 U/L 89  AST 15 - 41 U/L 20  ALT 14 - 54 U/L 16    Discharge instruction: per After Visit Summary and "Baby and Me  Booklet".  After visit meds:    Medication List    STOP taking these medications        acetaminophen 500 MG tablet  Commonly known as:  TYLENOL     glyBURIDE 2.5 MG tablet  Commonly known as:  DIABETA      TAKE these medications        docusate sodium 100 MG capsule  Commonly known as:  COLACE  Take 100 mg by mouth 2 (two) times daily.     prenatal multivitamin Tabs tablet  Take 1 tablet by mouth at bedtime.        Diet: carb modified diet  Activity: Advance as tolerated. Pelvic rest for 6 weeks.   Outpatient follow up:6 weeks Follow up Appt:Future Appointments Date Time Provider [place]  04/05/2015 9:45 AM Donnamae Jude, MD [facility]   Follow up Visit:No Follow-up on file.  Postpartum contraception: Condoms  Newborn Data: Live born female  Birth Weight: 4 lb 9 oz (2070 g) APGAR: 9, 9  Baby Feeding: Breast Disposition:NICU   04/01/2015 Emeterio Reeve, MD

## 2015-04-04 ENCOUNTER — Ambulatory Visit: Payer: Self-pay

## 2015-04-04 NOTE — Lactation Note (Deleted)
This note was copied from the chart of [place]. Lactation Consultation Note  Patient Name: Boy Tyera Eatherly [id number] Date: 04/04/2015 Reason for consult: Follow-up assessment;NICU baby  NICU baby 28 days old. Assisted with latching baby in football position to right breast. Attempted to latch directly to breast, but mom's nipples flat. Mom easily expressible and compressible. Baby able to taste EBM but sleepy at breast. Fitted mom with a #20 NS, and gave #24 with instructions. Attempted to latch several times with #20 NS but baby too sleepy to suckle. Baby would suckle this LC's gloved finger, and demonstrated to mom how much of the nipple would need to be in baby's mouth, but baby would not suckle with NS. Enc mom to keep offering STS and attempts at breast as baby able.  Maternal Data    Feeding Feeding Type: Breast Fed Length of feed: 0 min  LATCH Score/Interventions Latch: Too sleepy or reluctant, no latch achieved, no sucking elicited. Intervention(s): Skin to skin;Teach feeding cues;Waking techniques  Audible Swallowing: None  Type of Nipple: Flat (Mom states everts more with stimulation--pump)  Comfort (Breast/Nipple): Soft / non-tender     Hold (Positioning): Assistance needed to correctly position infant at breast and maintain latch. Intervention(s): Breastfeeding basics reviewed;Support Pillows;Position options;Skin to skin  LATCH Score: 4  Lactation Tools Discussed/Used Tools: Nipple Shields Nipple shield size: 20;24   Consult Status Consult Status: PRN    Inocente Salles 04/04/2015, 2:46 PM

## 2015-04-04 NOTE — Lactation Note (Signed)
This note was copied from the chart of [place]. Lactation Consultation Note  Patient Name: Veronica Obrien [id number] Date: 04/04/2015 Reason for consult: Follow-up assessment;NICU baby NICU baby 49 days old. Called to discuss mom's question regarding pumping. Mom pumping at baby's bedside when this [place] arrived. Mom states that her breasts "wake her up at night" with a tingling feeling to pump. Mom reports that during the night is the time that she gets the least when she pumps. Discussed with mom that she is probably getting more hind/fatty milk at that pump session. Discussed with mom that everyone is different. Mom stated that she did not do this with her daughter. Discussed with mom that she pumped and nursed with her daughter, so this is a different situation relying on the DEBP. Mom getting over 2 ounces at this current pumping session.   Maternal Data    Feeding Feeding Type: Breast Milk Length of feed: 30 min  LATCH Score/Interventions                      Lactation Tools Discussed/Used     Consult Status Consult Status: PRN    Inocente Salles 04/04/2015, 12:28 PM

## 2015-04-05 ENCOUNTER — Encounter: Payer: Self-pay | Admitting: Family Medicine

## 2015-04-06 ENCOUNTER — Ambulatory Visit: Payer: Self-pay

## 2015-04-06 NOTE — Lactation Note (Signed)
This note was copied from the chart of [place]. Lactation Consultation Note  Mom has an abundant milk supply and prefers to use a hand pump.  She states she can control the speed and it works better.  C/o of lump which is slightly tender under left breast.  Small firm area palpated but no redness noted.  Mom denies fever or other s/s of mastitis.  She did have mastitis in this breast with first baby.  Instructed to use heat and massage prior to and during pumping, watch for s/s of mastitis and call if no improvement in the next few days.  Patient Name: Veronica Obrien [id number] Date: 04/06/2015     Maternal Data    Feeding Feeding Type: Breast Milk Length of feed: 45 min  LATCH Score/Interventions                      Lactation Tools Discussed/Used     Consult Status      Ave Filter 04/06/2015, 12:38 PM

## 2015-04-13 ENCOUNTER — Ambulatory Visit: Payer: Self-pay

## 2015-04-13 NOTE — Lactation Note (Signed)
This note was copied from the chart of [place]. Lactation Consultation Note  Patient Name: Veronica Obrien [id number] Date: 04/13/2015 Reason for consult: Follow-up assessment NICU baby 71 weeks old. Called to NICU to assist with latching baby. Mom reports that she has attempted to latch baby about 6 times overall, but that baby has only really latched once. Assisted mom to latch baby directly to the breast, but baby becoming disinterested and sleepy. Applied #20 NS and baby latched on first attempt. Baby opened his mouth wide with lips flanged, and suckled rhythmically with intermittent swallows noted. Lots of milk noted in NS throughout the first 10 minutes of breastfeed. Demonstrated to mom how to apply NS and explained the reasons for the baby needing the shield right now. Mom is keeping up with her pumping and has an abundant breastmilk supply.   Maternal Data    Feeding Feeding Type: Breast Fed  LATCH Score/Interventions Latch: Grasps breast easily, tongue down, lips flanged, rhythmical sucking. Intervention(s): Skin to skin;Waking techniques Intervention(s): Adjust position;Assist with latch  Audible Swallowing: Spontaneous and intermittent Intervention(s): Hand expression Intervention(s): Alternate breast massage  Type of Nipple: Flat (short shaft)  Comfort (Breast/Nipple): Soft / non-tender     Hold (Positioning): No assistance needed to correctly position infant at breast.  LATCH Score: 9  Lactation Tools Discussed/Used Tools: Nipple Shields Nipple shield size: 20   Consult Status Consult Status: PRN    Inocente Salles 04/13/2015, 1:26 PM

## 2015-04-15 ENCOUNTER — Ambulatory Visit: Payer: Self-pay

## 2015-04-15 NOTE — Lactation Note (Signed)
This note was copied from the chart of [place]. Lactation Consultation Note  Met with mom in the NICU.  Baby just finished feeding and transferred 42 mls using nipple shield.  Asking questions about safety of airborne.   Reassured mom this is fine to take while breastfeeding.  Mom has an abundant milk supply.  She has a symphony pump from [facility] but feels the manual pump is more comfortable and efficient.   Encouraged to call for assist/concerns prn.  Patient Name: Veronica Obrien [id number] Date: 04/15/2015     Maternal Data    Feeding Feeding Type: Breast Milk Nipple Type: Slow - flow Length of feed: 30 min  LATCH Score/Interventions                      Lactation Tools Discussed/Used     Consult Status      Ave Filter 04/15/2015, 12:29 PM

## 2015-04-20 ENCOUNTER — Ambulatory Visit: Payer: Self-pay

## 2015-04-20 NOTE — Lactation Note (Signed)
This note was copied from the chart of [place]. Lactation Consultation Note Follow up visit at 38 weeks of age, CGA of [redacted]w[redacted]d.  NICU RN called LC for latch assist due to new onset of nipple pain.  Mom is using a NS with cross cradle latch and baby appears to have a good latch, but mom c/o pain.  Baby removed from breast with milk visible in NS that appears to have been applied slightly off center.  Mom used hand pump at bedside to further soften breast.  Noted a small [place] "bleb" on nipple that is lighter in color with expressing breast.  Discussed option of position changes to help empty breast well.  Mom does reports softening well with pumping when at home.  Mom to request further assist if she continues to have pain or "bleb" does not go away.  Baby latched with minimal assist and maintains good strong rhythmic sucking.  Mom denies pain with latch and feels better with latch assist.  Mom now knows to remove nipple shield and reapply if she has latch pain.  Mom to call as needed for assist and reports baby may be discharged tomorrow.  Mom is aware of o/p services.    Patient Name: [name] [id number] Date: 04/20/2015 Reason for consult: Follow-up assessment   Maternal Data    Feeding Feeding Type: Breast Fed Length of feed:  (observed 10 minutes)  LATCH Score/Interventions Latch: Grasps breast easily, tongue down, lips flanged, rhythmical sucking. Intervention(s): Skin to skin;Waking techniques Intervention(s): Breast compression;Breast massage;Adjust position  Audible Swallowing: Spontaneous and intermittent Intervention(s): Skin to skin;Hand expression  Type of Nipple: Everted at rest and after stimulation (short nipples) Intervention(s): Hand pump  Comfort (Breast/Nipple): Soft / non-tender     Hold (Positioning): Assistance needed to correctly position infant at breast and maintain latch. Intervention(s): Breastfeeding basics reviewed;Position options;Skin to  skin  LATCH Score: 9  Lactation Tools Discussed/Used Tools: Nipple Shields Nipple shield size: 20   Consult Status Consult Status: PRN    Justice Britain 04/20/2015, 8:08 PM

## 2015-05-07 ENCOUNTER — Ambulatory Visit (INDEPENDENT_AMBULATORY_CARE_PROVIDER_SITE_OTHER): Payer: Medicaid Other | Admitting: Obstetrics and Gynecology

## 2015-05-07 ENCOUNTER — Encounter: Payer: Self-pay | Admitting: Obstetrics and Gynecology

## 2015-05-07 ENCOUNTER — Ambulatory Visit (HOSPITAL_COMMUNITY)
Admission: RE | Admit: 2015-05-07 | Discharge: 2015-05-07 | Disposition: A | Discharge status: Home / Self Care | Payer: Medicaid Other | Source: Ambulatory Visit | Attending: Obstetrics and Gynecology | Admitting: Obstetrics and Gynecology

## 2015-05-07 NOTE — Progress Notes (Signed)
Patient ID: Veronica Obrien, female   DOB: [birth year], 35 y.o.   MRN: [id number]

## 2015-05-07 NOTE — Progress Notes (Signed)
  Subjective:     Veronica Obrien is a 35 y.o. female who presents for a postpartum visit. She is 7 weeks postpartum following a spontaneous vaginal delivery. I have fully reviewed the prenatal and intrapartum course. The delivery was at 32.5 gestational weeks. Outcome: spontaneous vaginal delivery. Anesthesia: epidural. Postpartum course has been uncomplicated. Baby's course has been complicated by a prolonged NICU stay (3 weeks) secondary to prematurity and low birth weight. Baby is feeding by breast. Bleeding no bleeding. Bowel function is normal. Bladder function is normal. Patient is sexually active. Contraception method is condoms. Postpartum depression screening: negative.     Review of Systems Pertinent items are noted in HPI.   Objective:    BP 119/59 mmHg  Pulse 69  Temp(Src) 98.7 F (37.1 C)  Wt 199 lb 1.6 oz (90.311 kg)  Breastfeeding? Yes  General:  alert, cooperative and no distress   Breasts:  inspection negative, no nipple discharge or bleeding, no masses or nodularity palpable  Lungs: clear to auscultation bilaterally  Heart:  regular rate and rhythm  Abdomen: soft, non-tender; bowel sounds normal; no masses,  no organomegaly   Vulva:  normal  Vagina: normal vagina, no discharge, exudate, lesion, or erythema  Cervix:  multiparous appearance  Corpus: normal size, contour, position, consistency, mobility, non-tender  Adnexa:  normal adnexa and no mass, fullness, tenderness  Rectal Exam: Not performed.        Assessment:     Normal postpartum exam. Pap smear not done at today's visit.   Plan:    1. Contraception: condoms 2. Patient is medically cleared to resume all activities of daily living 3. Follow up in: a few days  For 2 hour glucola or as needed.

## 2015-05-07 NOTE — Lactation Note (Addendum)
Lactation Consult Baby has been home from NICU for about 2 weeks.  Mom reports last week only gaining and ounce and this week is up almost [place].  Mom has increased pumping and supplement and is wanting to stop use of NS.  Mom is having difficulty with NS use and latch on right breast and feels left is going well.  She sometimes pumps instead of latching on right breast and baby is always getting supplement after feedings.  Baby seems to get tired on left with a good flow.  Fit mom with #16 with improved fit on right breast.  LC concerned baby is still tiring out at breast.  Mom will limit feedings at breast with supplement to 30 minutes to further conserve calories.  Mom is feeling encouraged with use of #16 and reports feedings feel better now.  Mom to work on plan and return in 1 week for follow up feeding assessment.  Baby appears to have some limitations with elevation of posterior tongue with out frenulum visible.  LC unsure if this is hindering latching and transferring of milk as baby has had premature issues as well.  Mom is aware to call with additional concerns and has appt. For 05/14/15 at 1pm.       Mother's reason for visit:  Help with stopping use of NS Visit Type:  Feeding assessment Appointment Notes:  Changed NS size on right breast to #16 Consult:  Initial Lactation Consultant:  Justice Britain  ________________________________________________________________________ 23 Name: Veronica Obrien Date of Birth: 03/30/2014 Pediatrician: Dr. Bonney Roussel peds Gender: female Gestational Age: [redacted]w[redacted]d (At Birth) Birth Weight: 4 lb 9 oz (2070 g) Weight at Discharge: Weight: 5 lb 8 oz (2494 g)Date of Discharge: 04/21/2015 Filed Weights   04/18/15 2030 04/19/15 1530 04/20/15 1604  Weight: 5 lb 5.1 oz (2413 g) 5 lb 5.3 oz (2418 g) 5 lb 8 oz (2494 g)   Last weight taken from location outside of [facility]:5#15oz 2/1/17at peds Weight  today:6lbs 4.8oz (2856grams)      ________________________________________________________________________  Mother's Name: Veronica Obrien Type of delivery:  vag Breastfeeding Experience:  5 months with older child now 9 Maternal Medical Conditions:  Placenta previa and abruption Maternal Medications:  PNV  ________________________________________________________________________  Breastfeeding History (Post Discharge)  Frequency of breastfeeding:  Every 2 1/2-3 hours Duration of feeding:  15 min to 60 min    Pumping  Type of pump:  Medela pump in style Frequency:  6-8 times daily post feedings Volume:  60-1559ml  Infant Intake and Output Assessment  Voids:  6+ in 24 hrs.   Stools:  3-4 in 24 hrs.   ________________________________________________________________________  Maternal Breast Assessment  Breast:  Soft and Compressible Nipple:  Flat Pain level:  0 Pain interventions:  Nipple shield  _______________________________________________________________________ Feeding Assessment/Evaluation  Initial feeding assessment:  Infant's oral assessment:  Posterior tongue restriction without frenulum visible maybe decreased elevation  Positioning:  Football Right breast  LATCH documentation:  Latch:  1 = Repeated attempts needed to sustain latch, nipple held in mouth throughout feeding, stimulation needed to elicit sucking reflex.  Audible swallowing:  1 = A few with stimulation  Type of nipple:  1 = Flat  Comfort (Breast/Nipple):  2 = Soft / non-tender  Hold (Positioning):  2 = No assistance needed to correctly position infant at breast  LATCH score:  7  Attached assessment:  Deep latch with help  Lips flanged:  Yes.    Lips untucked:  Yes.    Suck assessment:  Displays both  Tools:  Nipple shield 16 mm was using #20, but was falling off frequently Instructed on use and cleaning of tool:  Yes.    Pre-feed weight:  2856 g Post-feed weight:  2874 g Amount  transferred: 18 ml after 5 minutes than additional 40mls after 5 more minutes   Additional Feeding Assessment -  Positioning:  Cross cradle Left breast improved latch on left breast with #20 NS maintained additional 5 minutes with audible swallows  Pre-feed weight:  2870 g  Post-feed weight:  2902 g  Amount transferred: 32 ml Total amount pumped post feed:  R ~60 ml    L ~60 ml  Total amount transferred:  58 ml Total supplement given:  ~60 ml

## 2015-05-14 ENCOUNTER — Inpatient Hospital Stay (HOSPITAL_COMMUNITY)
Admission: RE | Admit: 2015-05-14 | Discharge: 2015-05-14 | Disposition: A | Discharge status: Home / Self Care | Payer: Self-pay | Source: Ambulatory Visit | Attending: Obstetrics and Gynecology | Admitting: Obstetrics and Gynecology

## 2015-05-14 NOTE — Lactation Note (Signed)
Lactation Consult  Mother's reason for visit: per mom help with breast feeding , weight and feed  Visit Type: feeding assessment  Appointment Notes:  2nd O/P appt. Born at 32 weeks ( [birth year])  Using NS , wants to stop using if she can, needs check - pre-post weight. 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     Baby's Name: Veronica Obrien Date of Birth: 03/30/2014 Pediatrician:[facility] - Dr. 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   @ consult baby latched 2 times with and without the NS .  Transferred off 68 ml ( 40 ml with a #20 NS and 28 ml without the NS.  Mom post pumped on the right breast ( due to baby not feeding as long )  with 45 ml EBM Yield  LC felt baby still would benefit latching with the NS until weight continues to increase.  9 oz gain since [facility] consult last week  Mom is consistent with extra pumping , after feeding and when I need to with 2-6 oz yield  Using a Dr. Owens Shark medium base nipple for supplementing and baby transfers milk well ( observe at [facility] appt. )   See Quinter below and Consult assessment   Lactation Plan of Care: Keep up the great efforts breast feeding and pumping  Recommend attending Breast feeding support group on either Monday's at 7 pm or Tuesday 11am for weight check  Feedings - Recommend feeding every 2 1/2 - 3 hours and with feeding cues.  Prior to feeding - burp Charlie , in between breast and afterwards Steps for latching - Breast massage, hand express , roll the nipple or - pre-pump with hand pump to make nipple more erect  Apply #20 NS ( as shown ) and mom returned demo  Latch with firm support Options: If Your breast aren't has full after feeding both breast and Eduard Clos is still hingry , try to latch without the  NS- Remember sandwich the breast tissue as Eduard Clos is latching.  Important Left breast - ( 2 small plug ducts just above the inner aspect of the areola )  Apply warm moist heat to plugged areas 10 mins before latch - latch in cross cradle position until plugs resolved. Options: If Your  breast aren't has full after feeding both breast and Eduard Clos is still hingry , try to latch without the  NS- Remember sandwich the breast tissue as Eduard Clos is latching.  Massage while feeding as shown.  Pumping- Continue when necessary and what you are already doing.  Transitioning back to work -  Feed the baby before going to work and plan enough time to post  pump to soften breast well - it will buy you time at work .  @ work pump both breast for 15 -20 mins, both breast together , save milk . In an 8 hour shift at work plan on post pumping 2 times.  Ideally at 3 hours , ok at 4  hours - Protect milk supply    ________________________________________________________________________  Mother's Name: [name] Type of delivery:  Vaginal Delivery  Breastfeeding Experience:  2nd baby  Maternal Medical Conditions:  No risk  Maternal Medications:   ________________________________________________________________________  Breastfeeding History (Post Discharge)  Frequency of breastfeeding:  8-10 x's a day  Duration of feeding:  Every 2 -4 hours   Supplementing: see note above   Pumping: see note above   Infant Intake and Output Assessment  Voids:  10-14  in 24 hrs.  Color:  Clear yellow Stools:  3  in 24 hrs.  Color:  Yellow  ________________________________________________________________________  Maternal Breast Assessment  Breast:  Full  Nipple:  Semi erect , compressible areolas ,  Pain level:  0 Pain interventions:  Expressed breast milk  _______________________________________________________________________ Feeding Assessment/Evaluation  Initial feeding assessment:  Infant's oral assessment:  See above note lactation impression   Positioning:  Cross cradle Left breast  LATCH documentation:  Latch:  2 = Grasps breast easily, tongue down, lips flanged, rhythmical sucking.  Audible swallowing:  2 = Spontaneous and intermittent  Type of nipple:  1 = Flat  Comfort (Breast/Nipple):  1 = Filling, red/small blisters or bruises, mild/mod discomfort  Hold (Positioning):  1 = Assistance needed to correctly position infant at breast and maintain latch  LATCH score:  7   Attached assessment:  Deep  Lips flanged:  Yes.    Lips untucked:  Yes.    Suck assessment:  Nutritive and Nonnutritive  Tools:  Nipple shield 20 mm Instructed on use and cleaning of tool:  Yes.    Pre-feed weight:  3106 g , 6-13.5 oz  Post-feed weight: 3146 g , 6-15.0 oz  Amount transferred:  40 ml  Amount supplemented: none   Additional Feeding Assessment -    Infant's oral assessment:  Variance see above note - lactation Impression   Positioning:  Football Left breast  LATCH documentation:  Latch:  2 = Grasps breast easily, tongue down, lips flanged, rhythmical sucking.  Audible swallowing:  2 = Spontaneous and intermittent  Type of nipple:  1 = Flat  Comfort (Breast/Nipple):  2 = Soft / non-tender  Hold (Positioning):  1 = Assistance needed to correctly position infant at breast and maintain latch  LATCH score:  8   Attached assessment:  Shallow @ 1st   Lips flanged:  Yes.    Lips untucked:  Yes.    Suck assessment:  Nutritive and Nonnutritive  Tools:  Latched without the NS  Instructed on use and cleaning of tool:  Yes.    Stool diaper changed - re-weight   Pre-feed weight:  3126 g , 6-14.2 oz  Post-feed weight:  3154 g , 6-15.3 oz  Amount transferred: 28 ml  Amount supplemented:  Sips    Total amount pumped post feed: 45 ml  ( only pumped the right breast )   Total amount transferred:  68 ml  Total supplement given:  Sips ( baby had eat'en 2 hours before [facility] consult @ oz

## 2015-05-20 ENCOUNTER — Other Ambulatory Visit: Payer: Self-pay

## 2015-05-26 ENCOUNTER — Encounter: Payer: Self-pay | Admitting: *Deleted

## 2015-07-27 ENCOUNTER — Telehealth (HOSPITAL_COMMUNITY): Payer: Self-pay | Admitting: Lactation Services

## 2015-07-27 NOTE — Telephone Encounter (Signed)
Mom of 12 month old baby called with concerns of milk supply decreasing.  Mom initially had an abundant supply and after return to work she was not able to pump as much and supply decreased.  Mom is very emotional and crying.  She states she took a leave of absence from work 2 weeks ago and pumps every 1-2 hours during the day and every 4 hours at night.  She obtains 60-90 mls but baby's needs are much greater.  Mom is using freezer supply and does not want baby to have formula.  Discussed how stress and fatigue can impact milk supply and letdown. Mom exclusively pumping because baby never latched. She is using a symphony pump.  Recommended not pumping more often than 2 hours during the day to allow for more rest and lessen stress.  Reminded to relax when pumping.  Mom is taking herbal supplements.  Discussed talking to [facility] doctor about possibly using reglan.  Support given and mom will call back prn.

## 2015-10-28 ENCOUNTER — Emergency Department (HOSPITAL_COMMUNITY): Payer: Medicaid Other

## 2015-10-28 ENCOUNTER — Emergency Department (HOSPITAL_COMMUNITY)
Admission: EM | Admit: 2015-10-28 | Discharge: 2015-10-28 | Disposition: A | Discharge status: Home / Self Care | Payer: Medicaid Other | Attending: Emergency Medicine | Admitting: Emergency Medicine

## 2015-10-28 ENCOUNTER — Encounter (HOSPITAL_COMMUNITY): Payer: Self-pay | Admitting: Emergency Medicine

## 2015-10-28 DIAGNOSIS — Z87891 Personal history of nicotine dependence: Secondary | ICD-10-CM | POA: Insufficient documentation

## 2015-10-28 DIAGNOSIS — R197 Diarrhea, unspecified: Secondary | ICD-10-CM

## 2015-10-28 DIAGNOSIS — R1011 Right upper quadrant pain: Secondary | ICD-10-CM

## 2015-10-28 LAB — COMPREHENSIVE METABOLIC PANEL
ALT: 29 U/L (ref 14–54)
AST: 22 U/L (ref 15–41)
Albumin: 4.3 g/dL (ref 3.5–5.0)
Alkaline Phosphatase: 83 U/L (ref 38–126)
Anion gap: 7 (ref 5–15)
BUN: 14 mg/dL (ref 6–20)
CO2: 27 mmol/L (ref 22–32)
Calcium: 9 mg/dL (ref 8.9–10.3)
Chloride: 105 mmol/L (ref 101–111)
Creatinine, Ser: 0.72 mg/dL (ref 0.44–1.00)
GFR calc Af Amer: >60 mL/min (ref >60)
GFR calc non Af Amer: >60 mL/min (ref >60)
Glucose, Bld: 120 mg/dL — ABNORMAL HIGH (ref 65–99)
Potassium: 3.7 mmol/L (ref 3.5–5.1)
Sodium: 139 mmol/L (ref 135–145)
Total Bilirubin: 0.6 mg/dL (ref 0.3–1.2)
Total Protein: 7.4 g/dL (ref 6.5–8.1)

## 2015-10-28 LAB — URINALYSIS, ROUTINE W REFLEX MICROSCOPIC
Bilirubin Urine: NEGATIVE
Glucose, UA: NEGATIVE mg/dL
Hgb urine dipstick: NEGATIVE
Ketones, ur: NEGATIVE mg/dL
Leukocytes, UA: NEGATIVE
Nitrite: NEGATIVE
Protein, ur: NEGATIVE mg/dL
Specific Gravity, Urine: 1.02 (ref 1.005–1.030)
pH: 7 (ref 5.0–8.0)

## 2015-10-28 LAB — CBC
HCT: 42 % (ref 36.0–46.0)
Hemoglobin: 14.4 g/dL (ref 12.0–15.0)
MCH: 29.3 pg (ref 26.0–34.0)
MCHC: 34.3 g/dL (ref 30.0–36.0)
MCV: 85.4 fL (ref 78.0–100.0)
Platelets: 332 10*3/uL (ref 150–400)
RBC: 4.92 MIL/uL (ref 3.87–5.11)
RDW: 12.9 % (ref 11.5–15.5)
WBC: 9.9 10*3/uL (ref 4.0–10.5)

## 2015-10-28 LAB — I-STAT BETA HCG BLOOD, ED (MC, WL, AP ONLY): I-stat hCG, quantitative: <5 m[IU]/mL (ref <5)

## 2015-10-28 LAB — LIPASE, BLOOD: Lipase: 23 U/L (ref 11–51)

## 2015-10-28 MED ORDER — DICYCLOMINE HCL 20 MG PO TABS: 20.0000 mg | ORAL_TABLET | Freq: Two times a day (BID) | ORAL | 0 refills | Status: DC | PRN

## 2015-10-28 MED ORDER — ONDANSETRON HCL 4 MG PO TABS: 4.0000 mg | ORAL_TABLET | Freq: Four times a day (QID) | ORAL | 0 refills | Status: DC | PRN

## 2015-10-28 MED ORDER — HYOSCYAMINE SULFATE 0.125 MG PO TABS
0.1250 mg | ORAL_TABLET | Freq: Once | ORAL | Status: AC
  Administered 2015-10-28: 0.125 mg via ORAL
  Filled 2015-10-28: qty 1

## 2015-10-28 NOTE — ED Provider Notes (Signed)
[facility] DEPT Provider Note   CSN: [id number] Arrival date & time: 10/28/15  [id number]  First Provider Contact:  First MD Initiated Contact with Patient 10/28/15 [phone]        History   Chief Complaint Chief Complaint  Patient presents with  . Abdominal Pain  . Diarrhea    HPI Veronica Obrien is a 35 y.o. female.  The history is provided by the patient. No language interpreter was used.  Abdominal Pain   Associated symptoms include diarrhea.  Diarrhea     Veronica Obrien is a 35 y.o. female who presents to the Emergency Department complaining of abdominal pain, diarrhea.  Late last night she developed a sharp right upper quadrant abdominal pain with associated severe diarrhea. She also endorses severe nausea but no vomiting. She denies any fevers, dysuria. No known sick contacts or bad food exposures. She has no prior history of surgery. She is 7 months postpartum and recently stopped breast-feeding.  Past Medical History:  Diagnosis Date  . Medical history non-contributory     Patient Active Problem List   Diagnosis Date Noted  . Active preterm labor 03/30/2015  . Gestational diabetes mellitus (GDM) affecting pregnancy, antepartum ([place]) 03/07/2015  . Placental abruption in third trimester 02/14/2015  . Supervision of high risk pregnancy, antepartum 12/24/2014  . Obesity (BMI 30-39.9) 12/24/2014  . Ovarian cyst in pregnancy 12/24/2014  . 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 WISDOM TOOTH EXTRACTION      OB History    Gravida Para Term Preterm AB Living   2 2 1 1  0 2   SAB TAB Ectopic Multiple Live Births   0 0 0 0         Home Medications    Prior to Admission medications   Medication Sig Start Date End Date Taking? Authorizing Provider  mebendazole (EMVERM) 100 MG chewable tablet Chew 100 mg by mouth once.   Yes Historical Provider, MD  dicyclomine (BENTYL) 20 MG tablet Take 1 tablet (20 mg total) by mouth 2  (two) times daily as needed for spasms (abdominal cramping). 10/28/15   Quintella Reichert, MD  ondansetron (ZOFRAN) 4 MG tablet Take 1 tablet (4 mg total) by mouth every 6 (six) hours as needed for nausea or vomiting. 10/28/15   Quintella Reichert, MD    Family History Family History  Problem Relation Age of Onset  . Arthritis Father   . Cancer Sister   . Heart disease Maternal Grandfather   . Diabetes Maternal Grandfather   . Diabetes Paternal Grandmother   . Cancer Paternal Grandmother   . Diabetes Paternal Grandfather     Social History Social History  Substance Use Topics  . Smoking status: Former Smoker    Packs/day: 0.00    Types: Cigarettes    Quit date: 06/06/2014  . Smokeless tobacco: Never Used  . Alcohol use No     Comment: NOT SINCE PREGNANCY     Allergies   Novocain [procaine hcl]   Review of Systems Review of Systems  Gastrointestinal: Positive for abdominal pain and diarrhea.  All other systems reviewed and are negative.    Physical Exam Updated Vital Signs BP 128/86 (BP Location: Left Arm)   Pulse 61   Temp 98.5 F (36.9 C) (Oral)   Resp 18   SpO2 99%   Physical Exam  Constitutional: She is oriented to person, place, and time. She appears well-developed and well-nourished.  HENT:  Head: Normocephalic and atraumatic.  Cardiovascular: Normal rate and regular rhythm.   No murmur heard. Pulmonary/Chest: Effort normal and breath sounds normal. No respiratory distress.  Abdominal: Soft. There is no rebound and no guarding.  Moderate right upper quadrant tenderness  Musculoskeletal: She exhibits no edema or tenderness.  Neurological: She is alert and oriented to person, place, and time.  Skin: Skin is warm and dry.  Psychiatric: She has a normal mood and affect. Her behavior is normal.  Nursing note and vitals reviewed.    ED Treatments / Results  Labs (all labs ordered are listed, but only abnormal results are displayed) Labs Reviewed    COMPREHENSIVE METABOLIC PANEL - Abnormal; Notable for the following:       Result Value   Glucose, Bld 120 (*)    All other components within normal limits  LIPASE, BLOOD  CBC  URINALYSIS, ROUTINE W REFLEX MICROSCOPIC (NOT AT [facility])  I-STAT BETA HCG BLOOD, ED ([facility], [facility], [facility] ONLY)    EKG  EKG Interpretation None       Radiology US Abdomen Limited Ruq  Result Date: 10/28/2015 CLINICAL DATA:  Right upper quadrant pain, 1 day duration. EXAM: US ABDOMEN LIMITED - RIGHT UPPER QUADRANT COMPARISON:  None. FINDINGS: Gallbladder: No gallstones or wall thickening visualized. No sonographic Murphy sign noted by sonographer. Common bile duct: Diameter: 1.6 mm. Liver: Echogenic liver suggesting fatty change. No focal lesion or biliary ductal dilatation. IMPRESSION: Diffusely echogenic liver probably due to fatty change. No evidence of cholecystitis or cholelithiasis. Electronically Signed   By: Nelson Chimes M.D.   On: 10/28/2015 11:03   Procedures Procedures (including critical care time)  Medications Ordered in ED Medications  hyoscyamine (LEVSIN, ANASPAZ) tablet 0.125 mg (0.125 mg Oral Given 10/28/15 1142)     Initial Impression / Assessment and Plan / ED Course  I have reviewed the triage vital signs and the nursing notes.  Pertinent labs & imaging results that were available during my care of the patient were reviewed by me and considered in my medical decision making (see chart for details).  Clinical Course    Patient here for evaluation of abdominal pain on the right side, diarrhea. She had significant right upper quadrant tenderness on initial evaluation. Ultrasound negative for cholecystitis. No lower abdominal tenderness, clinical picture is not just with acute appendicitis. Discussed with patient home care for diarrhea, right upper quadrant pain. Discussed outpatient follow-up and close return precautions.  Final Clinical Impressions(s) / ED Diagnoses   Final diagnoses:  RUQ  pain  Diarrhea, unspecified type    New Prescriptions Discharge Medication List as of 10/28/2015 11:26 AM    START taking these medications   Details  dicyclomine (BENTYL) 20 MG tablet Take 1 tablet (20 mg total) by mouth 2 (two) times daily as needed for spasms (abdominal cramping)., Starting Thu 10/28/2015, Print    ondansetron (ZOFRAN) 4 MG tablet Take 1 tablet (4 mg total) by mouth every 6 (six) hours as needed for nausea or vomiting., Starting Thu 10/28/2015, Print         Quintella Reichert, MD 10/29/15 1558

## 2015-10-28 NOTE — Discharge Instructions (Signed)
Your ultrasound showed a fatty liver. This will need to be followed up by your family doctor. If you develop worsening pain, uncontrolled vomiting, or fevers get rechecked immediately.

## 2015-10-28 NOTE — ED Triage Notes (Signed)
Pt reports bilateral lower abd pain worse on R side since 0030 this am accompanied by diarrhea. No emesis.

## 2015-10-28 NOTE — ED Notes (Signed)
Patient transported to Ultrasound 

## 2015-11-28 IMAGING — CT CT CERVICAL SPINE W/O CM
3 of 4 series · 12 of 33 positions shown, 14 images · non-contrast
Comparison: None.

CLINICAL DATA: Neck pain, left-sided weakness.

EXAM:
CT CERVICAL SPINE WITHOUT CONTRAST
TECHNIQUE: Multidetector CT imaging of the cervical spine was performed without
intravenous contrast. Multiplanar CT image reconstructions were also
generated.

[Series 6: coronals · coronal · 0.21mm/px · 3 of 42 slices shown]
[im 9/42  bone]
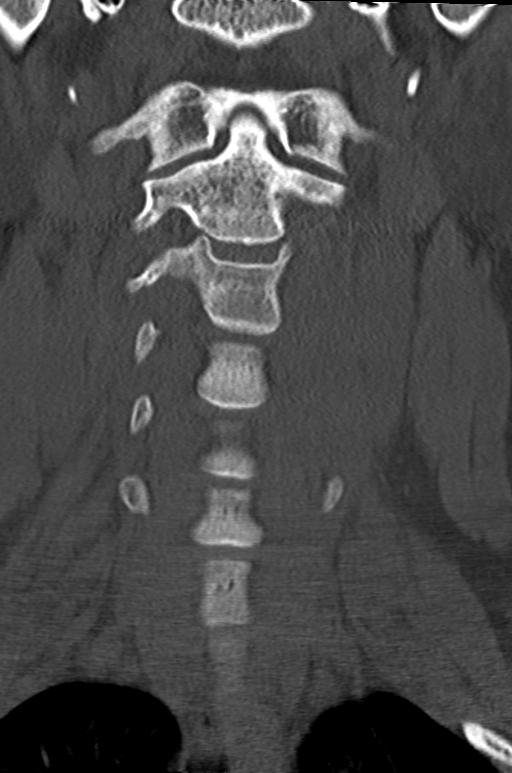
[im 17/42  bone]
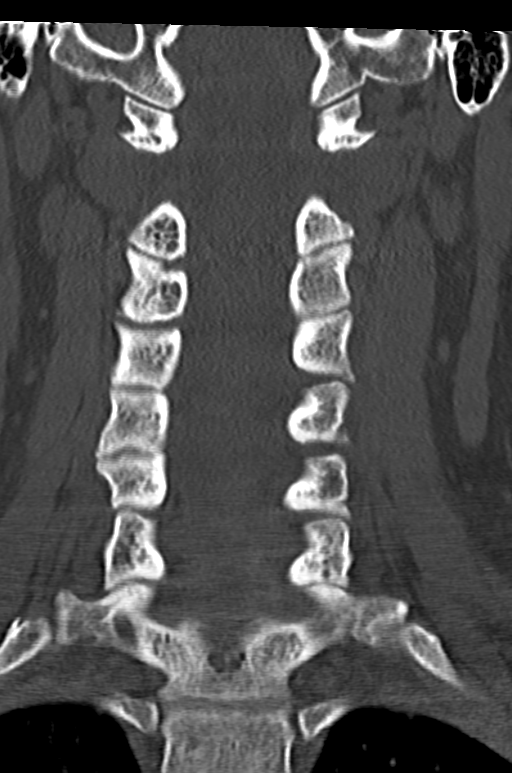
[im 25/42  bone]
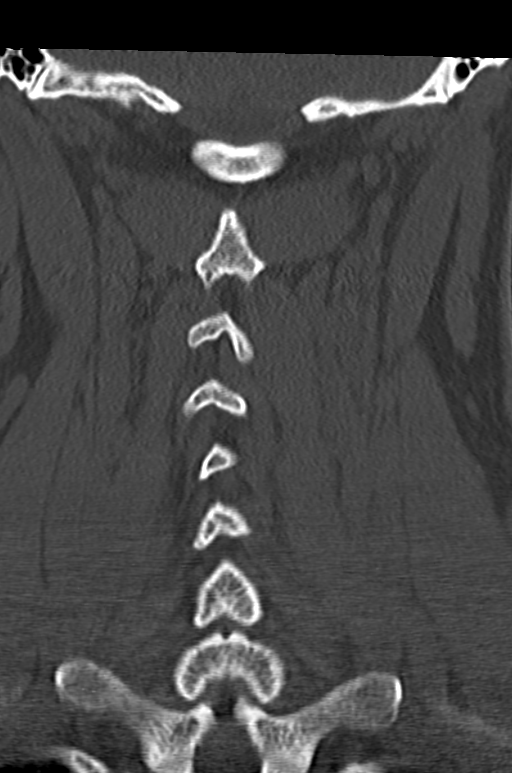

[Series 7: sagittals · sagittal · 0.23mm/px · 5 of 44 slices shown, 6 images]
[im 15/44  bone]
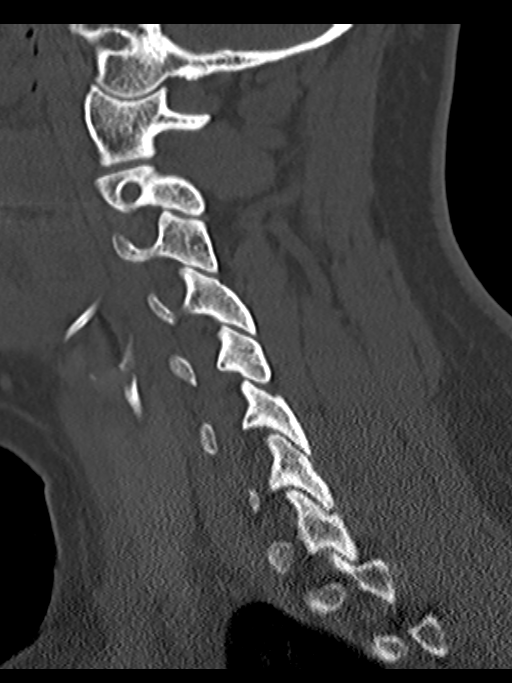
[im 18/44  bone]
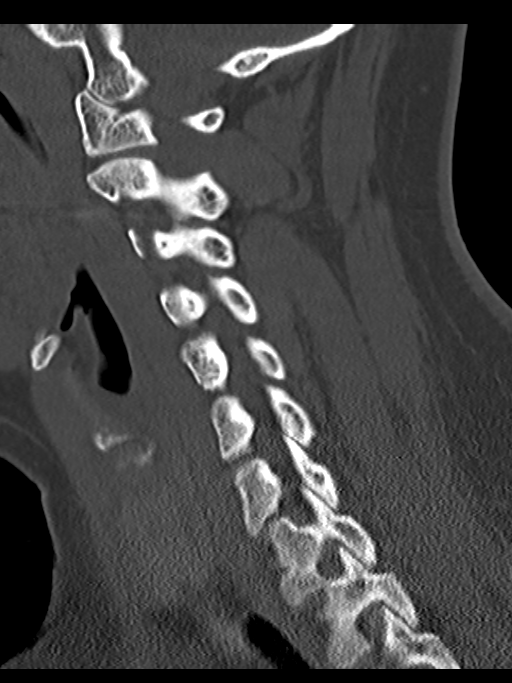
[im 22/44  soft-tissue]
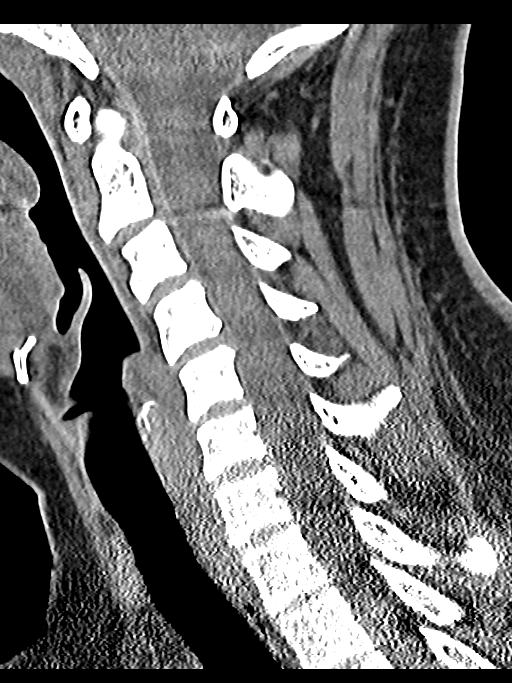
[im 22/44  bone]
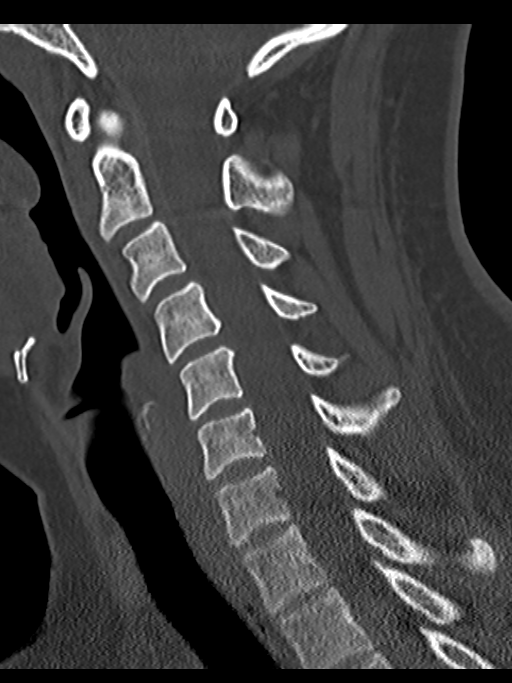
[im 26/44  bone]
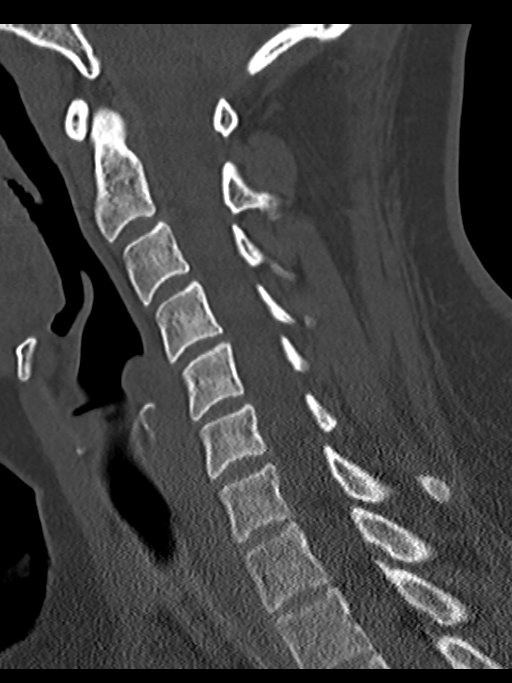
[im 29/44  bone]
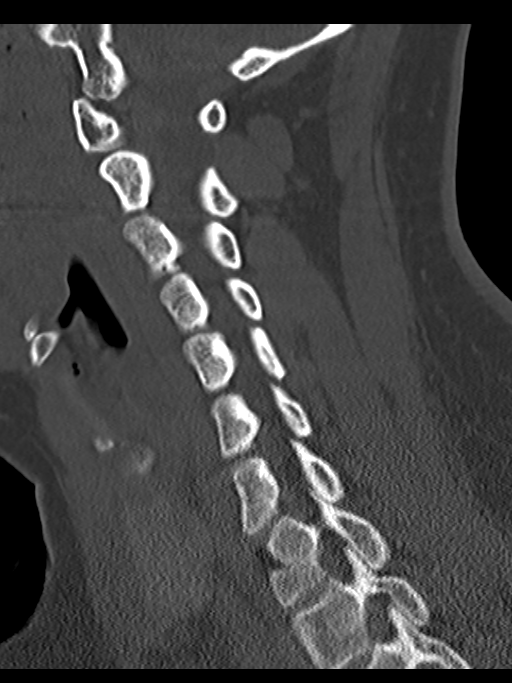

[Series 8: orthogonals · axial · 0.18mm/px · z∈[-169,-79]mm · 4 of 73 slices shown, 5 images]
[im 13/73  soft-tissue]
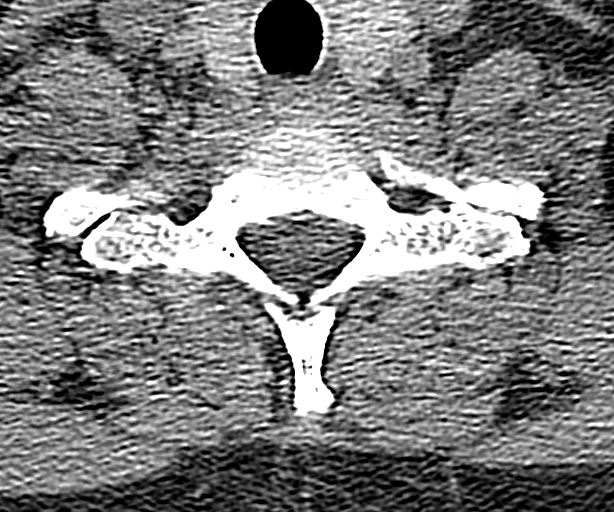
[im 13/73  bone]
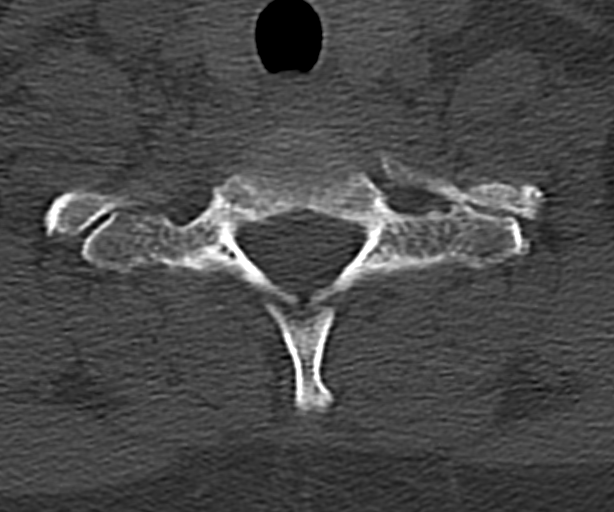
[im 25/73  bone]
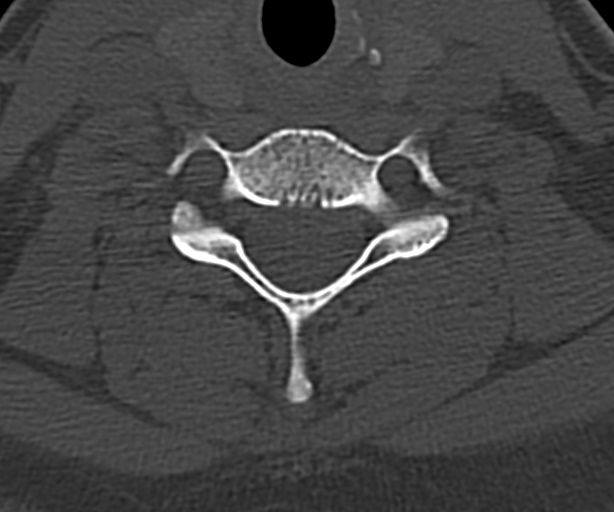
[im 49/73  bone]
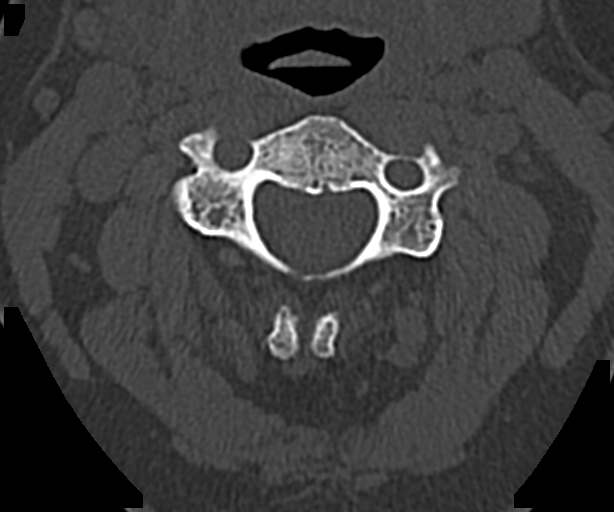
[im 61/73  bone]
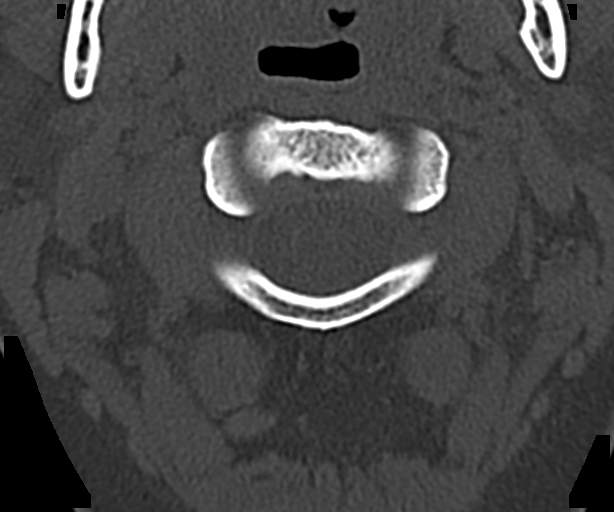

[12 of 33 positions shown; findings below may reference images not displayed]

FINDINGS: Cervical spine alignment is maintained. Vertebral body heights and
intervertebral disc spaces are preserved. There is no fracture. The
dens is intact. There are no jumped or perched facets. No bony
foraminal narrowing. No prevertebral soft tissue edema.
IMPRESSION: Normal CT of the cervical spine.

## 2016-05-19 ENCOUNTER — Encounter (HOSPITAL_COMMUNITY): Payer: Self-pay | Admitting: Family Medicine

## 2016-05-19 ENCOUNTER — Ambulatory Visit (HOSPITAL_COMMUNITY)
Admission: EM | Admit: 2016-05-19 | Discharge: 2016-05-19 | Disposition: A | Discharge status: Home / Self Care | Payer: Medicaid Other | Attending: Family Medicine | Admitting: Family Medicine

## 2016-05-19 DIAGNOSIS — J029 Acute pharyngitis, unspecified: Secondary | ICD-10-CM

## 2016-05-19 DIAGNOSIS — R05 Cough: Secondary | ICD-10-CM

## 2016-05-19 DIAGNOSIS — R059 Cough, unspecified: Secondary | ICD-10-CM

## 2016-05-19 LAB — POCT RAPID STREP A: Streptococcus, Group A Screen (Direct): NEGATIVE

## 2016-05-19 MED ORDER — HYDROCODONE-HOMATROPINE 5-1.5 MG/5ML PO SYRP: 5.0000 mL | ORAL_SOLUTION | Freq: Four times a day (QID) | ORAL | 0 refills | Status: DC | PRN

## 2016-05-19 MED ORDER — CHLORHEXIDINE GLUCONATE 0.12 % MT SOLN: 15.0000 mL | Freq: Two times a day (BID) | OROMUCOSAL | 0 refills | Status: DC

## 2016-05-19 NOTE — Discharge Instructions (Signed)
This is all the features of a viral pharyngitis. I'm giving her some medicine to help resolve this faster and make you more comfortable, but there is no sign of strep on the tests we ran tonight.

## 2016-05-19 NOTE — ED Provider Notes (Signed)
[facility]    CSN: [id number] Arrival date & time: 05/19/16  [place]     History   Chief Complaint Chief Complaint  Patient presents with  . Cough    HPI Veronica Obrien is a 36 y.o. female.   Is a 36 year old married woman who comes in with cough and sore throat this been going on over week. She's had no fever during this time. In addition she's had extreme fatigue      Past Medical History:  Diagnosis Date  . Medical history non-contributory     Patient Active Problem List   Diagnosis Date Noted  . Active preterm labor 03/30/2015  . Gestational diabetes mellitus (GDM) affecting pregnancy, antepartum 03/07/2015  . Placental abruption in third trimester 02/14/2015  . Supervision of high risk pregnancy, antepartum 12/24/2014  . Obesity (BMI 30-39.9) 12/24/2014  . Ovarian cyst in pregnancy 12/24/2014  . 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 WISDOM TOOTH EXTRACTION      OB History    Gravida Para Term Preterm AB Living   2 2 1 1  0 2   SAB TAB Ectopic Multiple Live Births   0 0 0 0 2       Home Medications    Prior to Admission medications   Medication Sig Start Date End Date Taking? Authorizing Provider  chlorhexidine (PERIDEX) 0.12 % solution Use as directed 15 mLs in the mouth or throat 2 (two) times daily. 05/19/16   Robyn Haber, MD  HYDROcodone-homatropine (HYDROMET) 5-1.5 MG/5ML syrup Take 5 mLs by mouth every 6 (six) hours as needed for cough. 05/19/16   Robyn Haber, MD    Family History Family History  Problem Relation Age of Onset  . Cancer Sister   . Arthritis Father   . Heart disease Maternal Grandfather   . Diabetes Maternal Grandfather   . Diabetes Paternal Grandmother   . Cancer Paternal Grandmother   . Diabetes Paternal Grandfather     Social History Social History  Substance Use Topics  . Smoking status: Former Smoker    Packs/day: 0.00    Types: Cigarettes   Quit date: 06/06/2014  . Smokeless tobacco: Never Used  . Alcohol use No     Comment: NOT SINCE PREGNANCY     Allergies   Novocain [procaine hcl]   Review of Systems Review of Systems  Constitutional: Positive for fatigue.  HENT: Positive for sore throat.   Respiratory: Positive for cough.   Gastrointestinal: Negative.   Neurological: Negative.      Physical Exam Triage Vital Signs ED Triage Vitals [05/19/16 1935]  Enc Vitals Group     BP 112/80     Pulse Rate 82     Resp 18     Temp 98.6 F (37 C)     Temp Source Oral     SpO2 100 %     Weight      Height      Head Circumference      Peak Flow      Pain Score      Pain Loc      Pain Edu?      Excl. in [place]?    No data found.   Updated Vital Signs BP 112/80 (BP Location: Left Arm)   Pulse 82   Temp 98.6 F (37 C) (Oral)   Resp 18   LMP 04/25/2016   SpO2 100%    Physical Exam  Constitutional: She is oriented to person, place, and time. She appears well-developed and well-nourished.  HENT:  Head: Normocephalic.  Right Ear: External ear normal.  Left Ear: External ear normal.  Mouth/Throat: Oropharynx is clear and moist.  Eyes: Conjunctivae and EOM are normal. Pupils are equal, round, and reactive to light.  Neck: Normal range of motion. Neck supple.  Cardiovascular: Normal rate, regular rhythm and normal heart sounds.   Pulmonary/Chest: Effort normal and breath sounds normal.  Musculoskeletal: Normal range of motion.  Neurological: She is alert and oriented to person, place, and time.  Skin: Skin is warm and dry.  Nursing note and vitals reviewed.    UC Treatments / Results  Labs (all labs ordered are listed, but only abnormal results are displayed) Labs Reviewed  POCT RAPID STREP A    EKG  EKG Interpretation None       Radiology No results found.  Procedures Procedures (including critical care time)  Medications Ordered in UC Medications - No data to display   Initial  Impression / Assessment and Plan / UC Course  I have reviewed the triage vital signs and the nursing notes.  Pertinent labs & imaging results that were available during my care of the patient were reviewed by me and considered in my medical decision making (see chart for details).     Final Clinical Impressions(s) / UC Diagnoses   Final diagnoses:  Cough  Acute pharyngitis, unspecified etiology    New Prescriptions New Prescriptions   CHLORHEXIDINE (PERIDEX) 0.12 % SOLUTION    Use as directed 15 mLs in the mouth or throat 2 (two) times daily.   HYDROCODONE-HOMATROPINE (HYDROMET) 5-1.5 MG/5ML SYRUP    Take 5 mLs by mouth every 6 (six) hours as needed for cough.     Robyn Haber, MD 05/19/16 [phone]

## 2016-05-19 NOTE — ED Triage Notes (Signed)
C/o     Fatigue     sorethroat    Cough   And   Congested   With a  sorethroat  For  About  1  Week     Pt

## 2016-05-22 LAB — CULTURE, GROUP A STREP (THRC)

## 2016-06-26 IMAGING — CR DG KNEE COMPLETE 4+V*R*
4 series · 4 of 4 positions shown · non-contrast
Comparison: None.

CLINICAL DATA: Fall at work this evening landing on her knees.
Tender to palpation over patella.

EXAM:
RIGHT KNEE - COMPLETE 4+ VIEW

[t knee ap right]
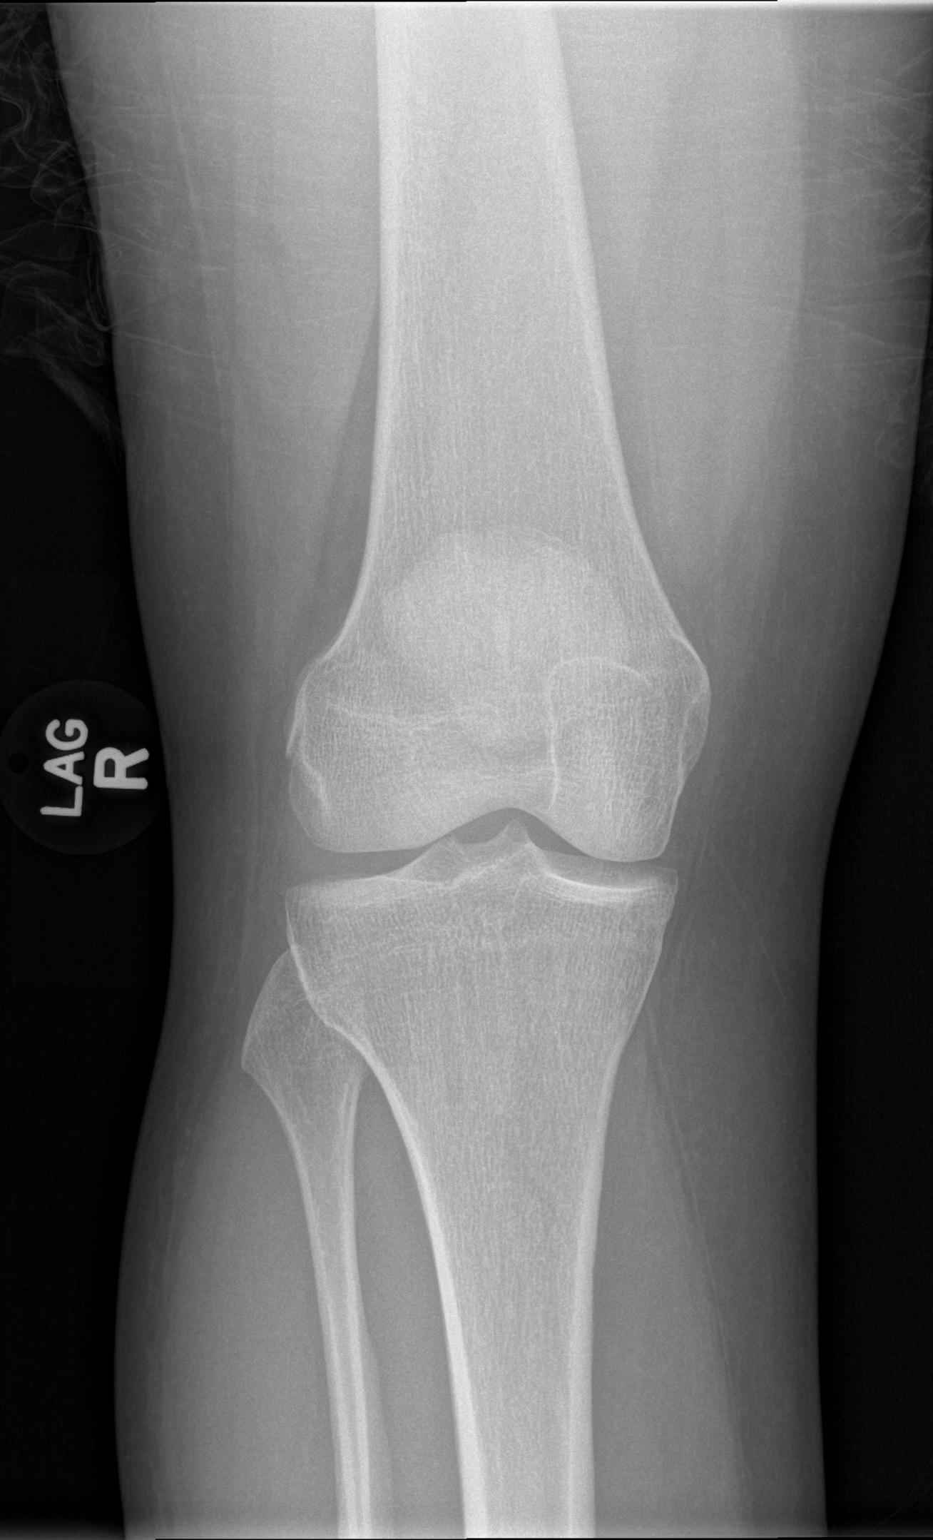

[t knee obl right (1 of 2)]
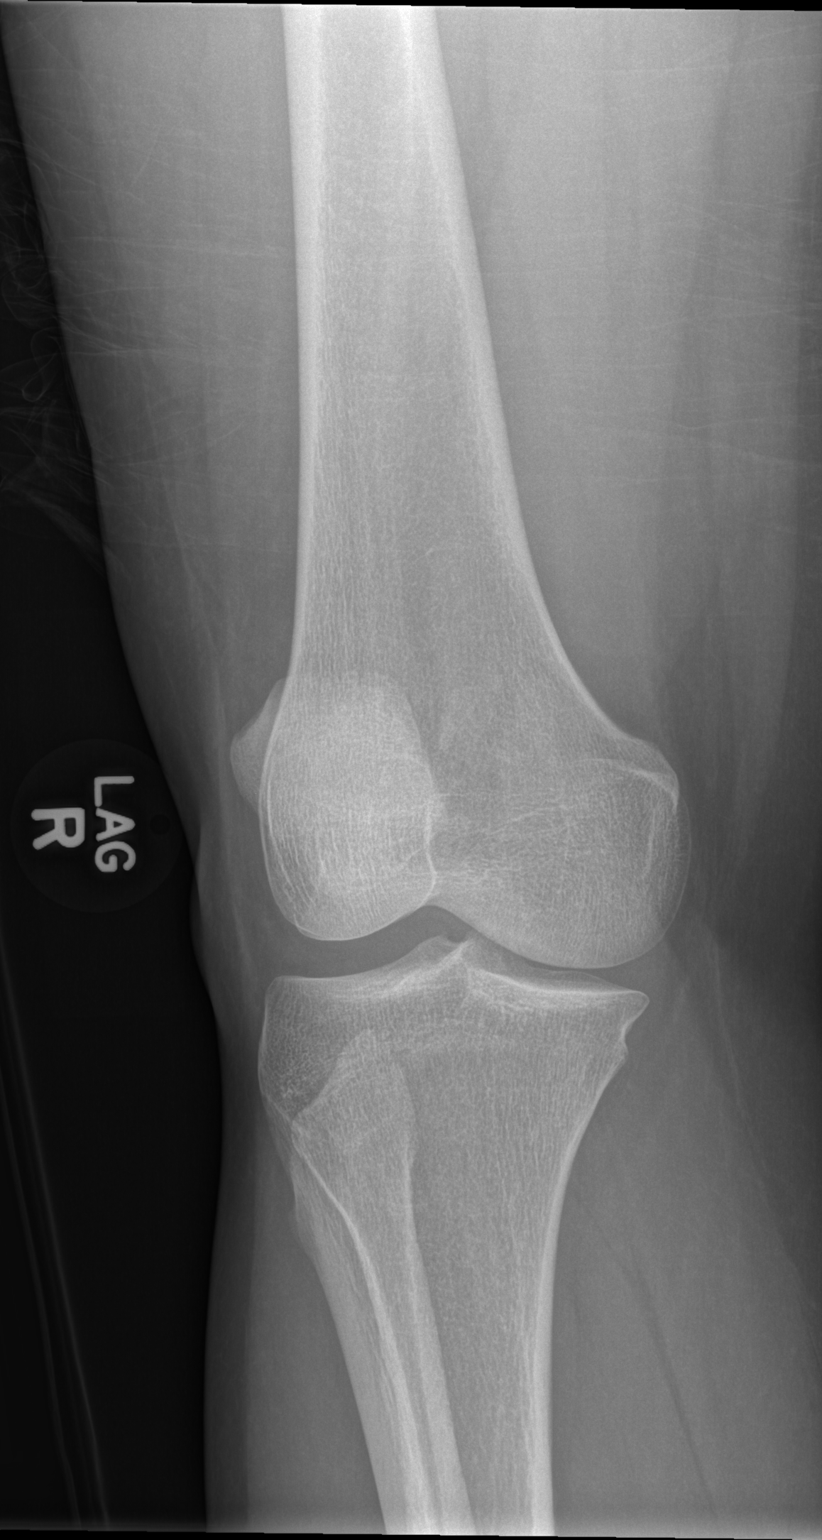

[t knee obl right (2 of 2)]
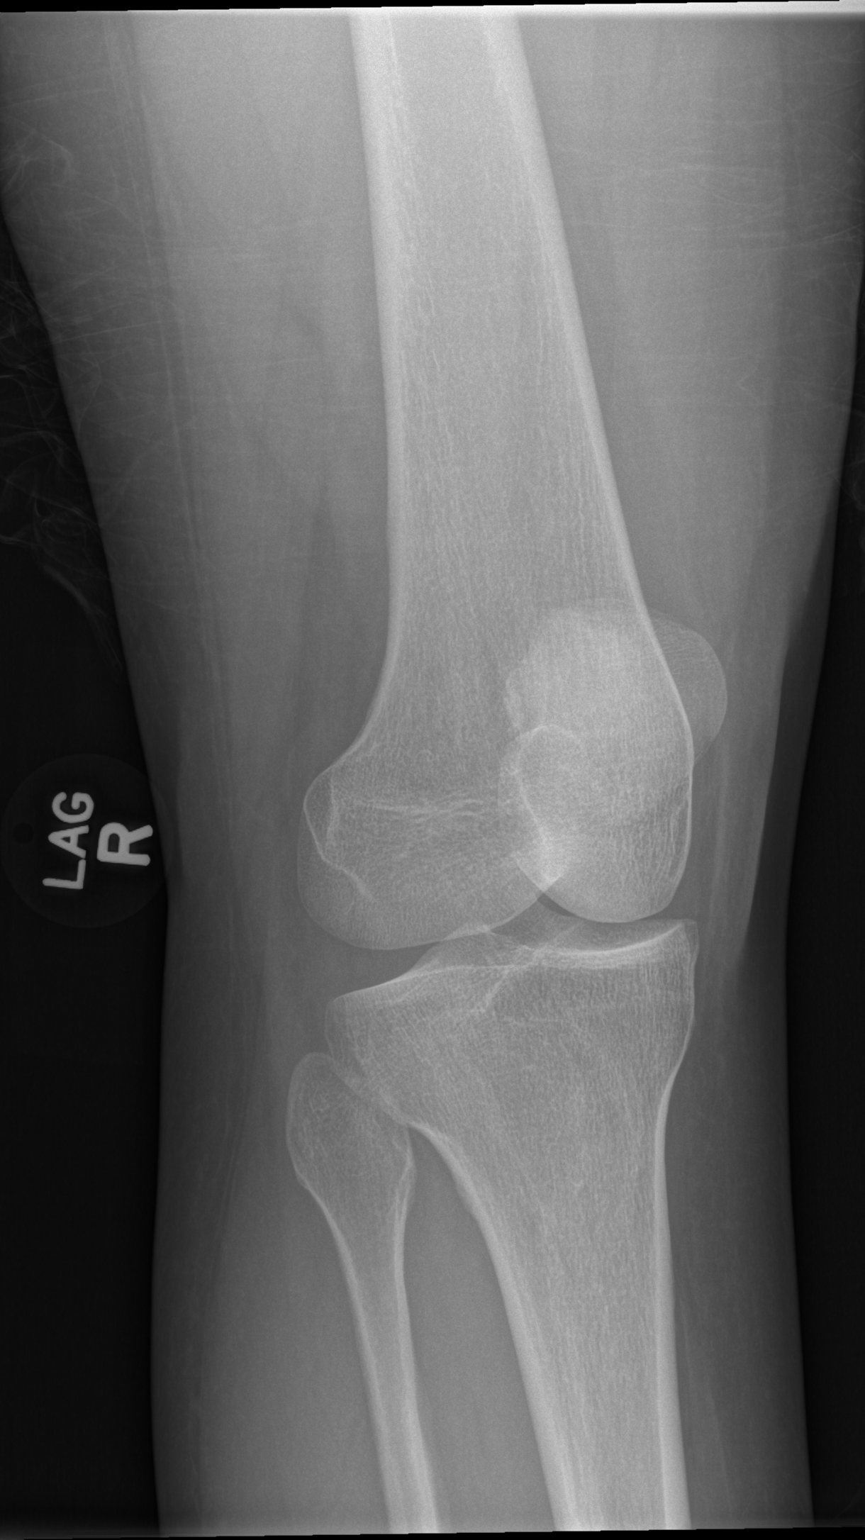

[t knee lat right]
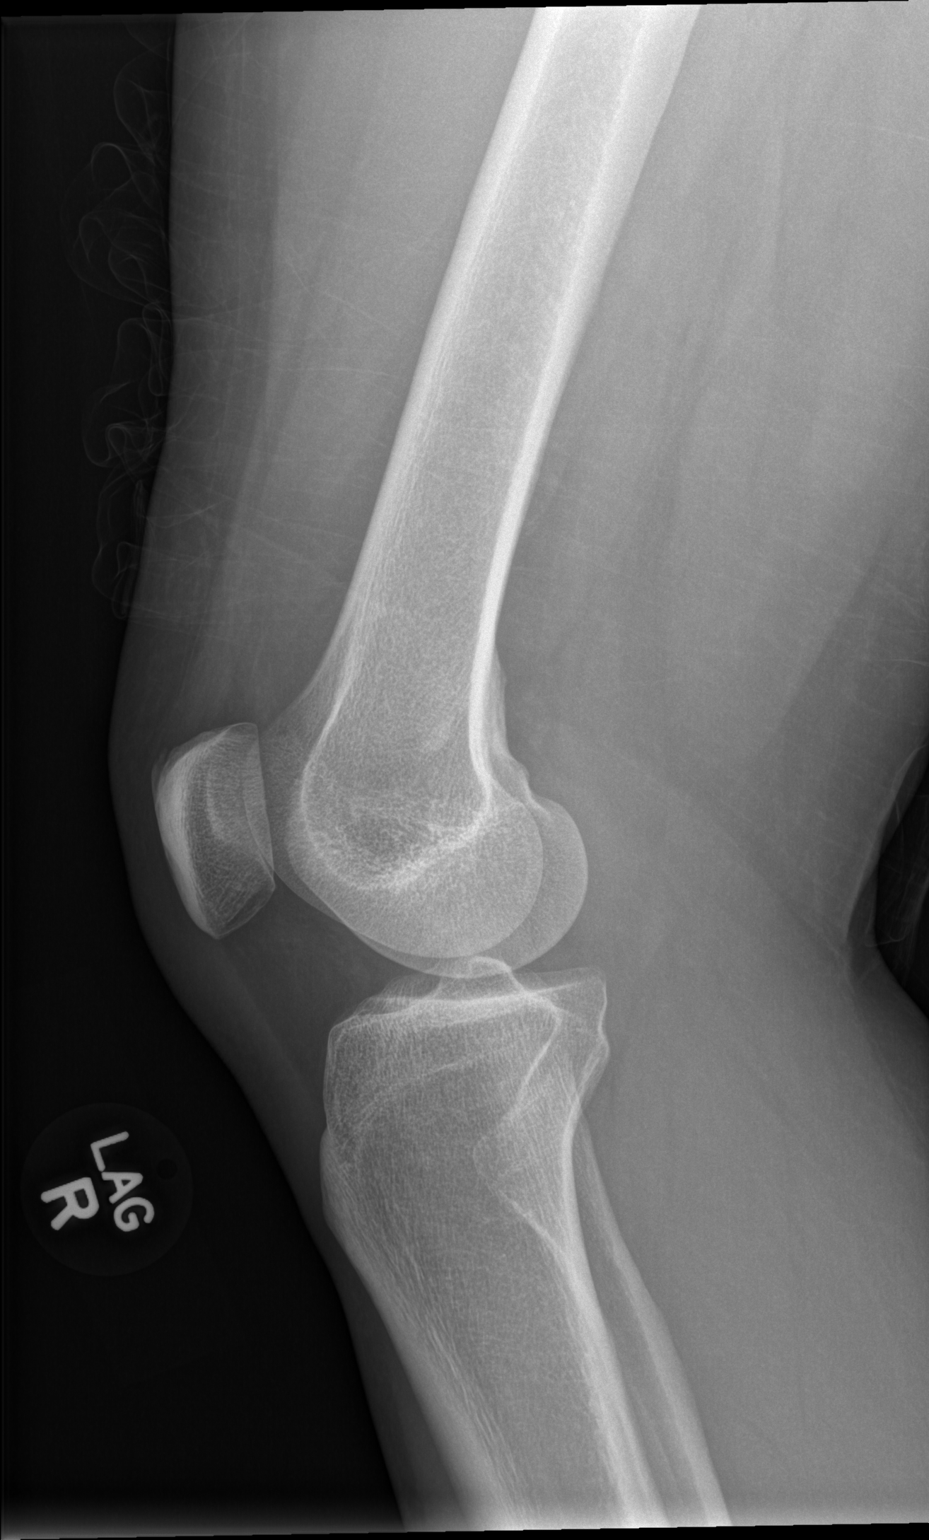

[4 of 4 positions shown; findings below may reference images not displayed]

FINDINGS: No fracture or dislocation. The alignment and joint spaces are
maintained. The patella appears intact. No joint effusion. No focal
soft tissue abnormality.
IMPRESSION: No fracture or dislocation of the right knee.

## 2016-08-06 IMAGING — US US MFM OB LIMITED
1 series · 10 of 10 positions shown · non-contrast
Comparison: none

Attending:        Hugo Humberto Nm     Secondary Phy.:    ANTE Nursing-
Antenatal [REDACTED]9

1  OSMAR CARLOCK                [id number]      [id number]     [id number]
Indications
Vaginal bleeding in pregnancy, second
trimester
No or Little Prenatal Care
Obesity complicating pregnancy, second
Placental abruption (02/14/15)
Ovarian cyst (RT simple)
30 weeks gestation of pregnancy
OB History
Gravidity:    2         Term:   1        Prem:   0        SAB:   0
TOP:          0       Ectopic:  0        Living: 1
Fetal Evaluation
Num Of Fetuses:     1
Fetal Heart         136
Rate(bpm):
Cardiac Activity:   Observed
Presentation:       Cephalic
Placenta:           Posterior, above cervical os
P. Cord Insertion:  Previously seen as normal
Amniotic Fluid
AFI FV:      Subjectively within normal limits
AFI Sum:     17.73   cm       66  %Tile     Larg Pckt:    6.38  cm
RUQ:   5.08    cm   RLQ:    4.08   cm    LUQ:   6.38    cm   LLQ:    2.19   cm
Gestational Age
LMP:           29w 1d        Date:  08/14/14                 EDD:   05/21/15
Best:          30w 3d     Det. By:  U/S  (01/04/15)          EDD:   05/12/15
Cervix Uterus Adnexa
Cervix
Not visualized (advanced GA >34wks)
Impression
INDICATION: 34 yr old [id number] at 30w3d with recent abruption
now with vaginal bleeding. Remote read.

[Series 1: us mfm ob limited · 10 acquisitions, 10 frames shown]
[im 1/10]
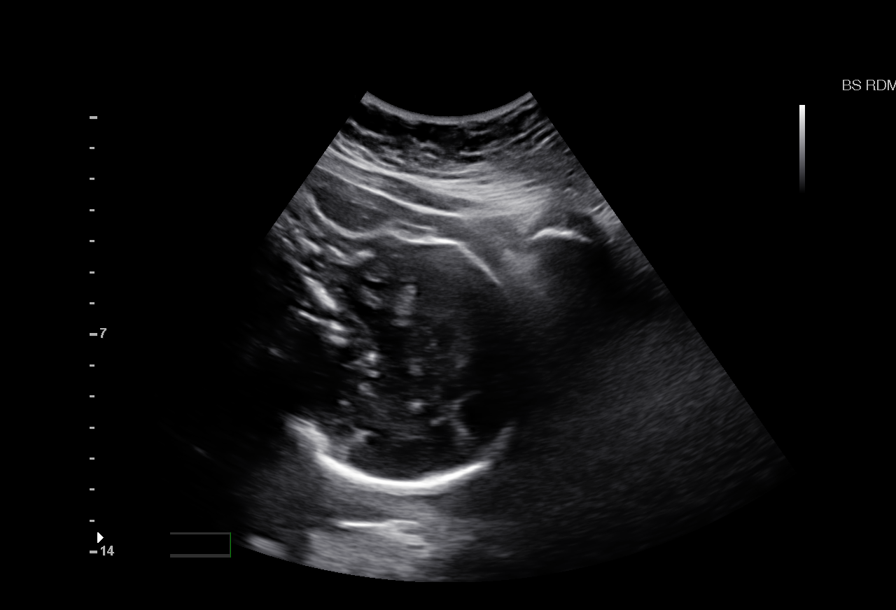
[im 2/10]
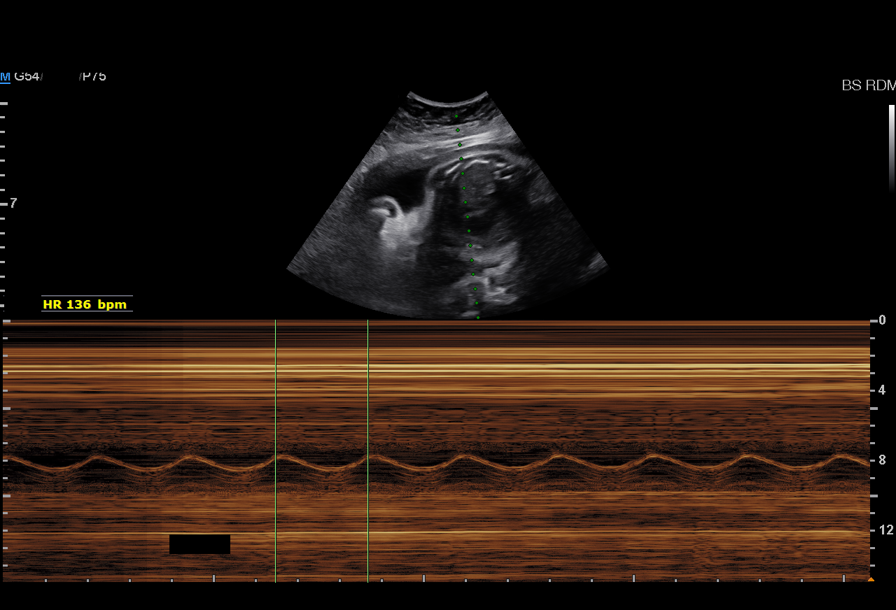
[im 3/10]
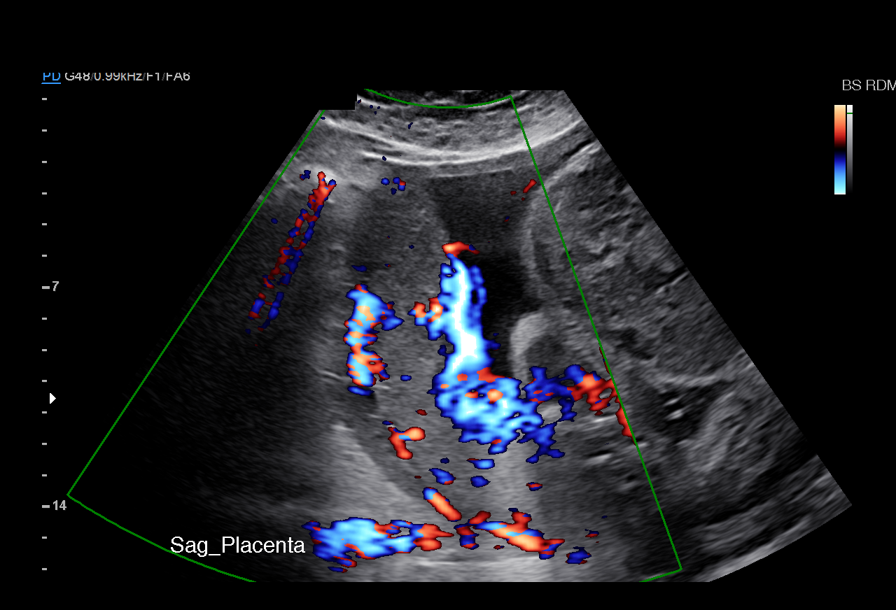
[im 4/10]
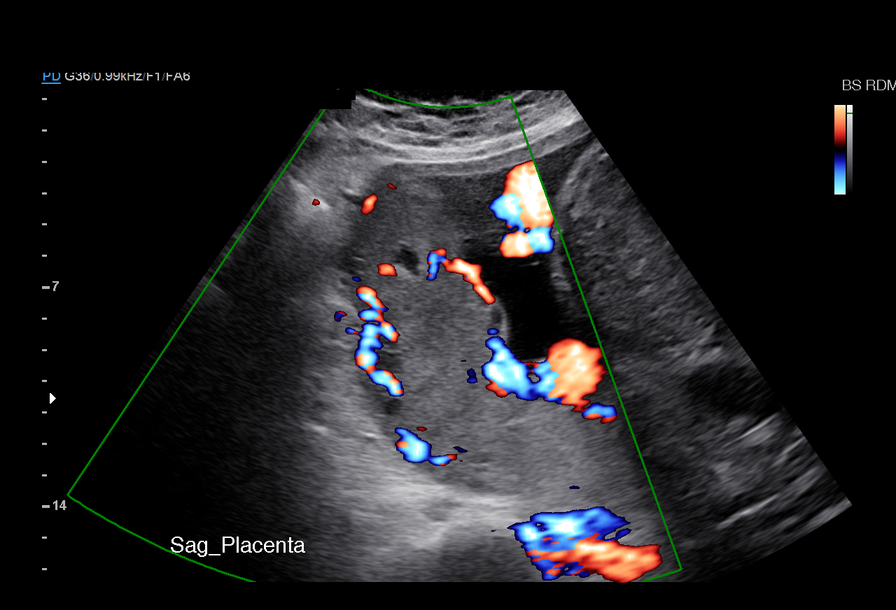
[im 5/10]
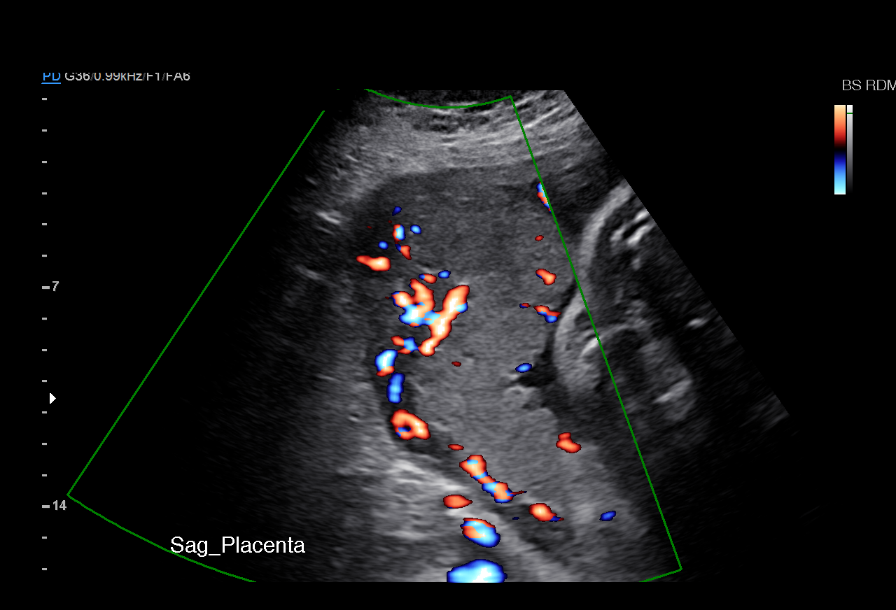
[im 6/10]
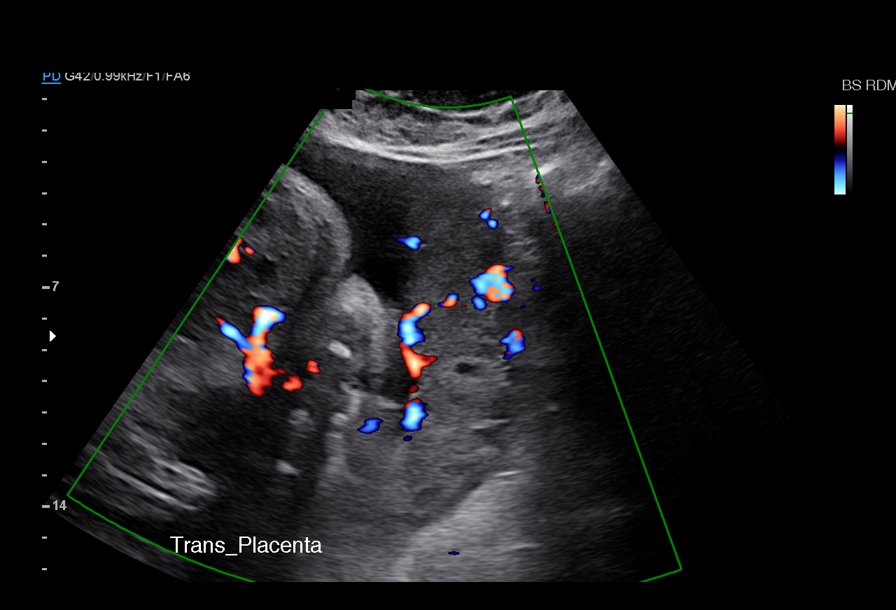
[im 7/10]
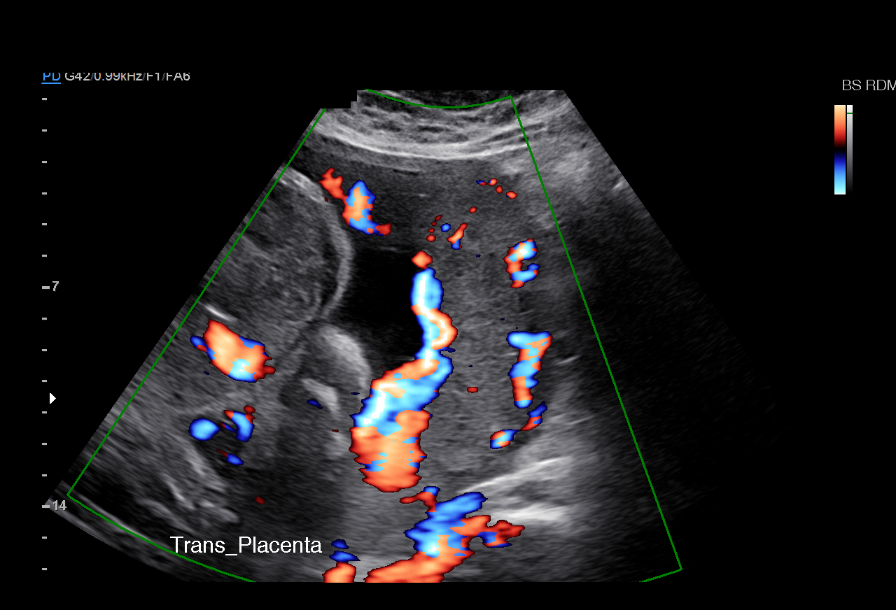
[im 8/10]
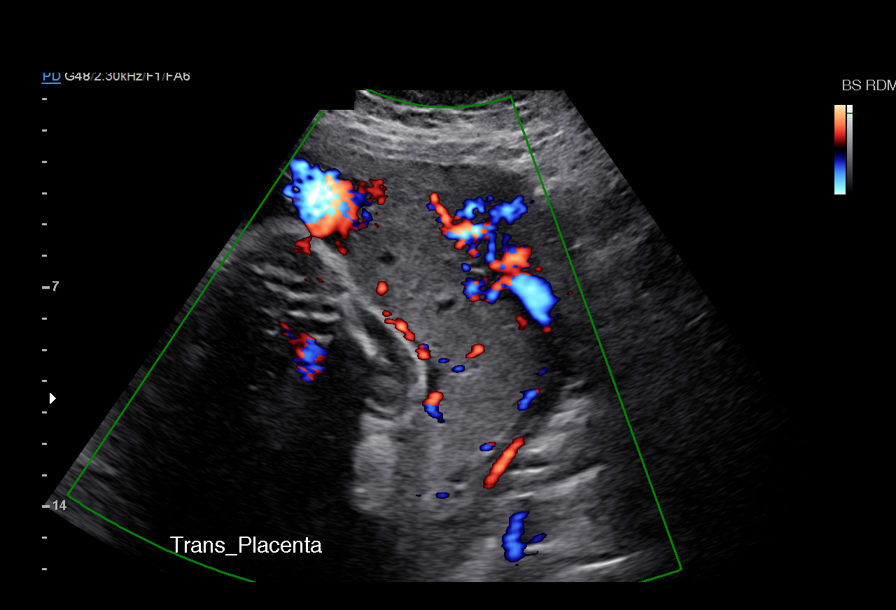
[im 9/10]
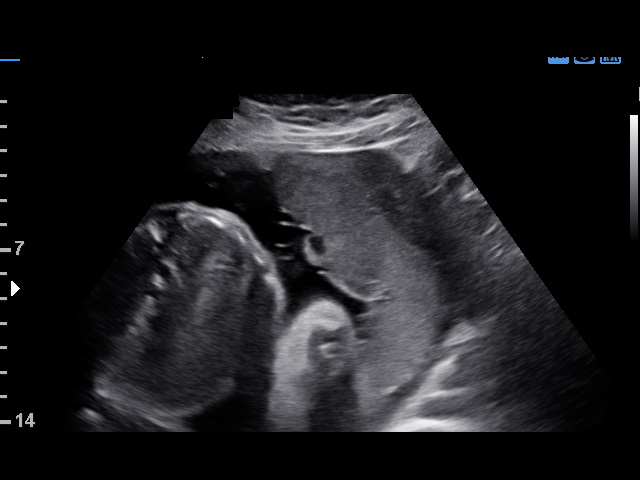
[im 10/10]
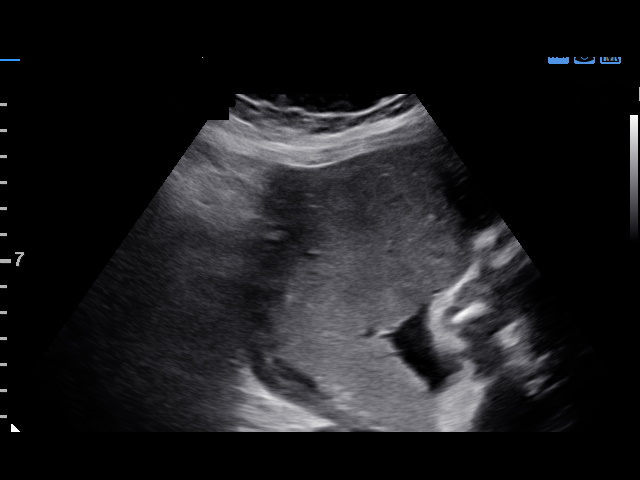

[10 of 10 positions shown; findings below may reference images not displayed]

FINDINGS: 1. Single intrauterine pregnancy.
2. Posterior placenta without evidence of previa.
3. Normal amniotic fluid index.
4. Fetus in cephalic presentation.
Recommendations

1. No evidence of previa.
2. Normal AFI.
3. Vaginal bleeding:
- Messenger/Freshkid Dada
- management per primary OB

## 2016-11-18 ENCOUNTER — Encounter (HOSPITAL_COMMUNITY): Payer: Self-pay | Admitting: *Deleted

## 2016-11-18 ENCOUNTER — Ambulatory Visit (HOSPITAL_COMMUNITY)
Admission: EM | Admit: 2016-11-18 | Discharge: 2016-11-18 | Disposition: A | Discharge status: Home / Self Care | Payer: Medicaid Other | Attending: Internal Medicine | Admitting: Internal Medicine

## 2016-11-18 DIAGNOSIS — L308 Other specified dermatitis: Secondary | ICD-10-CM

## 2016-11-18 MED ORDER — MOMETASONE FUROATE 0.1 % EX CREA: 1.0000 "application " | TOPICAL_CREAM | Freq: Every day | CUTANEOUS | 0 refills | Status: AC

## 2016-11-18 NOTE — ED Triage Notes (Addendum)
Patient reports she had rash/fungus type infection to feet x 1 year and not spreading to hands x 3. Patient reports OTC meds have not have been working. Patient states it has not spread to family members. The areas are getting larger.   Patient reports she also had right ear pain and neck pain x 3 days.

## 2016-11-18 NOTE — Discharge Instructions (Signed)
Your rash is consistent with eczema.  Use the steroid cream daily x 7 days, only use thin layer of it.  Keep hand and feet well moisturized to prevent flare up.  Avoid any known triggers or irritants that you know of that would cause flare up.  You may find applying Aquaphor or Vaseline on the rash to be helpful as well.

## 2016-11-18 NOTE — ED Provider Notes (Signed)
[name]    CSN: [id number] Arrival date & time: 11/18/16  1727     History   Chief Complaint Chief Complaint  Patient presents with  . Rash    HPI Veronica Obrien is a 36 y.o. female.   36 y.o. Female, healthy, here for rash on feet and hands for a long period of time. She reports rash on left lateral aspect of left foot for 2 years, a rash on sole of left foot for 8 month, rash on her hands for 3 months. Rash is itchy. Rash started as small and gets larger. Sometimes rash burns when washing hand in warm water. No other family member has same rash. 1 month old son has eczema. Dad has hx of psoriasis. She have tried OTC fungal medication and multiple home remedies with no improvement.        Past Medical History:  Diagnosis Date  . Medical history non-contributory     Patient Active Problem List   Diagnosis Date Noted  . Active preterm labor 03/30/2015  . Gestational diabetes mellitus (GDM) affecting pregnancy, antepartum 03/07/2015  . Placental abruption in third trimester 02/14/2015  . Supervision of high risk pregnancy, antepartum 12/24/2014  . Obesity (BMI 30-39.9) 12/24/2014  . Ovarian cyst in pregnancy 12/24/2014  . Complicated migraine 62/56/3893    Past Surgical History:  Procedure Laterality Date  . WISDOM TOOTH EXTRACTION      OB History    Gravida Para Term Preterm AB Living   2 2 1 1  0 2   SAB TAB Ectopic Multiple Live Births   0 0 0 0 2       Home Medications    Prior to Admission medications   Medication Sig Start Date End Date Taking? Authorizing Provider  chlorhexidine (PERIDEX) 0.12 % solution Use as directed 15 mLs in the mouth or throat 2 (two) times daily. 05/19/16   Robyn Haber, MD  HYDROcodone-homatropine (HYDROMET) 5-1.5 MG/5ML syrup Take 5 mLs by mouth every 6 (six) hours as needed for cough. 05/19/16   Robyn Haber, MD  mometasone (ELOCON) 0.1 % cream Apply 1 application topically daily. 11/18/16 11/25/16  Barry Dienes, NP    Family History Family History  Problem Relation Age of Onset  . Cancer Sister   . Arthritis Father   . Heart disease Maternal Grandfather   . Diabetes Maternal Grandfather   . Diabetes Paternal Grandmother   . Cancer Paternal Grandmother   . Diabetes Paternal Grandfather     Social History Social History  Substance Use Topics  . Smoking status: Former Smoker    Packs/day: 0.00    Types: Cigarettes    Quit date: 06/06/2014  . Smokeless tobacco: Never Used  . Alcohol use No     Comment: NOT SINCE PREGNANCY     Allergies   Novocain [procaine hcl]   Review of Systems Review of Systems  Constitutional:       See HPI     Physical Exam Triage Vital Signs ED Triage Vitals  Enc Vitals Group     BP 11/18/16 1901 123/83     Pulse Rate 11/18/16 1901 69     Resp 11/18/16 1901 16     Temp 11/18/16 1901 98.9 F (37.2 C)     Temp Source 11/18/16 1901 Oral     SpO2 11/18/16 1901 100 %     Weight --      Height --      Head Circumference --  Peak Flow --      Pain Score 11/18/16 1828 10     Pain Loc --      Pain Edu? --      Excl. in [place]? --    No data found.   Updated Vital Signs BP 123/83 (BP Location: Left Arm)   Pulse 69   Temp 98.9 F (37.2 C) (Oral)   Resp 16   SpO2 100%   Visual Acuity Right Eye Distance:   Left Eye Distance:   Bilateral Distance:    Right Eye Near:   Left Eye Near:    Bilateral Near:     Physical Exam  Constitutional: She is oriented to person, place, and time. She appears well-developed and well-nourished.  Cardiovascular: Normal rate, regular rhythm and normal heart sounds.   Pulmonary/Chest: Effort normal and breath sounds normal. She has no wheezes.  Neurological: She is alert and oriented to person, place, and time.  Skin:  See pictures. Rash located on bilateral palm of hand, lateral aspect of left foot, sole of left foot and some on right foot. Scaly and dry appearing.   Nursing note and vitals  reviewed.        UC Treatments / Results  Labs (all labs ordered are listed, but only abnormal results are displayed) Labs Reviewed - No data to display  EKG  EKG Interpretation None      Radiology No results found.  Procedures Procedures (including critical care time)  Medications Ordered in UC Medications - No data to display   Initial Impression / Assessment and Plan / UC Course  I have reviewed the triage vital signs and the nursing notes.  Pertinent labs & imaging results that were available during my care of the patient were reviewed by me and considered in my medical decision making (see chart for details).     Final Clinical Impressions(s) / UC Diagnoses   Final diagnoses:  Other eczema   Your rash is consistent with eczema.  Use the steroid cream daily x 7 days, only use thin layer of it.  Keep hand and feet well moisturized to prevent flare up.  Avoid any known triggers or irritants that you know of that would cause flare up.  You may find applying Aquaphor or Vaseline on the rash to be helpful as well.   New Prescriptions New Prescriptions   MOMETASONE (ELOCON) 0.1 % CREAM    Apply 1 application topically daily.     Controlled Substance Prescriptions [place] Controlled Substance Registry consulted? Not Applicable   Barry Dienes, NP 11/18/16 [phone]

## 2016-12-14 ENCOUNTER — Ambulatory Visit (HOSPITAL_COMMUNITY)
Admission: EM | Admit: 2016-12-14 | Discharge: 2016-12-14 | Disposition: A | Discharge status: Home / Self Care | Payer: Medicaid Other | Attending: Family Medicine | Admitting: Family Medicine

## 2016-12-14 ENCOUNTER — Encounter (HOSPITAL_COMMUNITY): Payer: Self-pay | Admitting: Family Medicine

## 2016-12-14 DIAGNOSIS — L299 Pruritus, unspecified: Secondary | ICD-10-CM | POA: Diagnosis not present

## 2016-12-14 DIAGNOSIS — L237 Allergic contact dermatitis due to plants, except food: Secondary | ICD-10-CM | POA: Diagnosis not present

## 2016-12-14 DIAGNOSIS — R21 Rash and other nonspecific skin eruption: Secondary | ICD-10-CM

## 2016-12-14 MED ORDER — METHYLPREDNISOLONE ACETATE 80 MG/ML IJ SUSP
80.0000 mg | Freq: Once | INTRAMUSCULAR | Status: AC
  Administered 2016-12-14: 80 mg via INTRAMUSCULAR

## 2016-12-14 MED ORDER — TRIAMCINOLONE ACETONIDE 0.1 % EX CREA: 1.0000 "application " | TOPICAL_CREAM | Freq: Two times a day (BID) | CUTANEOUS | 2 refills | Status: AC

## 2016-12-14 MED ORDER — METHYLPREDNISOLONE ACETATE 80 MG/ML IJ SUSP
INTRAMUSCULAR | Status: AC
  Filled 2016-12-14: qty 1

## 2016-12-14 MED ORDER — HYDROXYZINE HCL 25 MG PO TABS: 25.0000 mg | ORAL_TABLET | Freq: Four times a day (QID) | ORAL | 1 refills | Status: DC

## 2016-12-14 MED ORDER — PREDNISONE 10 MG (21) PO TBPK: ORAL_TABLET | Freq: Every day | ORAL | 0 refills | Status: DC

## 2016-12-14 NOTE — ED Provider Notes (Signed)
[place]   [id number] 12/14/16 Arrival Time: 1936   SUBJECTIVE:  Veronica Obrien is a 36 y.o. female who presents to the urgent care with complaint of itching, and full body rash. States she's got poison ivy, after weed eating. She is tried Benadryl, calamine lotion, hydrocortisone cream with minimal relief. Has no fever, chills, nausea, vomiting, difficulty breathing, trouble swallowing, wheezing, or any other systemic symptoms.     Past Medical History:  Diagnosis Date  . Medical history non-contributory    Family History  Problem Relation Age of Onset  . Cancer Sister   . Arthritis Father   . Heart disease Maternal Grandfather   . Diabetes Maternal Grandfather   . Diabetes Paternal Grandmother   . Cancer Paternal Grandmother   . Diabetes Paternal Grandfather    Social History   Social History  . Marital status: Single    Spouse name: N/A  . Number of children: N/A  . Years of education: N/A   Occupational History  . Not on file.   Social History Main Topics  . Smoking status: Former Smoker    Packs/day: 0.00    Types: Cigarettes    Quit date: 06/06/2014  . Smokeless tobacco: Never Used  . Alcohol use No     Comment: NOT SINCE PREGNANCY  . Drug use: No  . Sexual activity: Yes   Other Topics Concern  . Not on file   Social History Narrative  . No narrative on file   No outpatient prescriptions have been marked as taking for the 12/14/16 encounter [facility] Encounter).   Allergies  Allergen Reactions  . Novocain [Procaine Hcl] Other (See Comments)    Pt states that it makes her try to bite her tongue.        ROS: As per HPI, remainder of ROS negative.   OBJECTIVE:   Vitals:   12/14/16 2005  BP: 125/81  Pulse: 85  Resp: 18  Temp: 98.3 F (36.8 C)  SpO2: 98%     General appearance: alert; no distress Eyes:  conjunctiva normal HENT: normocephalic; atraumatic;  Neck: supple Lungs: clear to auscultation bilaterally Heart:  regular rate and rhythm Abdomen: soft, non-tender;  Extremities: no cyanosis or edema; symmetrical with no gross deformities Skin: warm and dry, Multiple maculopapular lesions on legs, arms, chest, face, consistent with poison ivy dermatitis Neurologic: normal gait; grossly normal Psychological: alert and cooperative; normal mood and affect      Labs:  Labs Reviewed - No data to display  No results found.     ASSESSMENT & PLAN:  1. Poison ivy dermatitis     Meds ordered this encounter  Medications  . methylPREDNISolone acetate (DEPO-MEDROL) injection 80 mg  . predniSONE (STERAPRED UNI-PAK 21 TAB) 10 MG (21) TBPK tablet    Sig: Take by mouth daily. Take 6 tabs by mouth daily  for 2 days, then 5 tabs for 2 days, then 4 tabs for 2 days, then 3 tabs for 2 days, 2 tabs for 2 days, then 1 tab by mouth daily for 2 days    Dispense:  42 tablet    Refill:  0    Order Specific Question:   Supervising Provider    AnswerVanessa Kick [[id number]]  . hydrOXYzine (ATARAX/VISTARIL) 25 MG tablet    Sig: Take 1 tablet (25 mg total) by mouth every 6 (six) hours.    Dispense:  30 tablet    Refill:  1    Order Specific  Question:   Supervising Provider    Answer:   Vanessa Kick [[id number]]  . triamcinolone cream (KENALOG) 0.1 %    Sig: Apply 1 application topically 2 (two) times daily.    Dispense:  30 g    Refill:  2    Order Specific Question:   Supervising Provider    Answer:   Vanessa Kick [[id number]]    Reviewed expectations re: course of current medical issues. Questions answered. Outlined signs and symptoms indicating need for more acute intervention. Patient verbalized understanding. After Visit Summary given.    Procedures:        Barnet Glasgow, NP 12/14/16 2100

## 2016-12-14 NOTE — ED Triage Notes (Signed)
Pt here for poison ivy all over.

## 2016-12-14 NOTE — Discharge Instructions (Signed)
Take the medicine as directed, return in one week as needed.

## 2016-12-14 NOTE — ED Notes (Signed)
Patient verbalized understanding of discharge instructions and denies any further needs or questions at this time. VS stable. Patient ambulatory with steady gait.  

## 2017-02-20 ENCOUNTER — Other Ambulatory Visit: Payer: Self-pay

## 2017-02-20 ENCOUNTER — Emergency Department (HOSPITAL_BASED_OUTPATIENT_CLINIC_OR_DEPARTMENT_OTHER)
Admission: EM | Admit: 2017-02-20 | Discharge: 2017-02-21 | Disposition: A | Discharge status: Home / Self Care | Payer: Medicaid Other | Attending: Emergency Medicine | Admitting: Emergency Medicine

## 2017-02-20 ENCOUNTER — Encounter (HOSPITAL_BASED_OUTPATIENT_CLINIC_OR_DEPARTMENT_OTHER): Payer: Self-pay | Admitting: *Deleted

## 2017-02-20 DIAGNOSIS — Z87891 Personal history of nicotine dependence: Secondary | ICD-10-CM | POA: Insufficient documentation

## 2017-02-20 DIAGNOSIS — S4991XA Unspecified injury of right shoulder and upper arm, initial encounter: Secondary | ICD-10-CM

## 2017-02-20 DIAGNOSIS — Y929 Unspecified place or not applicable: Secondary | ICD-10-CM | POA: Diagnosis not present

## 2017-02-20 DIAGNOSIS — X509XXA Other and unspecified overexertion or strenuous movements or postures, initial encounter: Secondary | ICD-10-CM | POA: Insufficient documentation

## 2017-02-20 DIAGNOSIS — Y9389 Activity, other specified: Secondary | ICD-10-CM | POA: Diagnosis not present

## 2017-02-20 DIAGNOSIS — Y998 Other external cause status: Secondary | ICD-10-CM | POA: Diagnosis not present

## 2017-02-20 NOTE — ED Notes (Signed)
C/o pain to rt upper chest area after shaking hands w someone 3 days ago,  States felt muscles pull,  States when rasing arm hears a pop  Can raise arm over head

## 2017-02-20 NOTE — ED Triage Notes (Signed)
2 days she shook some ones hand and she has had soreness in her right chest muscle and sternum since. Pain in her right shoulder for a while. Certain movements cause the pain to increase.

## 2017-02-21 ENCOUNTER — Emergency Department (HOSPITAL_BASED_OUTPATIENT_CLINIC_OR_DEPARTMENT_OTHER): Payer: Medicaid Other

## 2017-02-21 MED ORDER — NAPROXEN 500 MG PO TABS
500.0000 mg | ORAL_TABLET | Freq: Two times a day (BID) | ORAL | 0 refills | Status: DC | PRN
Start: 2017-02-21 — End: 2019-12-09

## 2017-02-21 MED ORDER — NAPROXEN 250 MG PO TABS
500.0000 mg | ORAL_TABLET | Freq: Once | ORAL | Status: AC
  Administered 2017-02-21: 500 mg via ORAL
  Filled 2017-02-21: qty 2

## 2017-02-21 NOTE — ED Provider Notes (Signed)
[facility] [facility] Provider Note: Veronica Spurling, MD, FACEP  CSN: [id number] MRN: [id number] ARRIVAL: 02/20/17 at 2152 ROOM: [place]  Shoulder Injury   HISTORY OF PRESENT ILLNESS  02/21/17 2:16 AM Veronica Obrien is a 36 y.o. female who injured her shoulder shaking hands vigorously with someone 2 days ago.  She felt an associated pop.  She is now having pain inferior to her right clavicle, in her right posterior lateral neck and in her right lateral shoulder.  Pain is worse with movement, notably internal rotation of the shoulder.  There is no associated deformity.  There is some associated paresthesias of the fingertips, but not the thumb, of the right hand.  She rates her pain as a 9 out of 10 at its worst, worse with movement or palpation.   Past Medical History:  Diagnosis Date  . Medical history non-contributory     Past Surgical History:  Procedure Laterality Date  . WISDOM TOOTH EXTRACTION      Family History  Problem Relation Age of Onset  . Cancer Sister   . Arthritis Father   . Heart disease Maternal Grandfather   . Diabetes Maternal Grandfather   . Diabetes Paternal Grandmother   . Cancer Paternal Grandmother   . Diabetes Paternal Grandfather     Social History   Tobacco Use  . Smoking status: Former Smoker    Packs/day: 0.00    Types: Cigarettes    Last attempt to quit: 06/06/2014    Years since quitting: 2.7  . Smokeless tobacco: Never Used  Substance Use Topics  . Alcohol use: No    Comment: NOT SINCE PREGNANCY  . Drug use: No    Prior to Admission medications   Medication Sig Start Date End Date Taking? Authorizing Provider  hydrOXYzine (ATARAX/VISTARIL) 25 MG tablet Take 1 tablet (25 mg total) by mouth every 6 (six) hours. 12/14/16   Barnet Glasgow, NP  predniSONE (STERAPRED UNI-PAK 21 TAB) 10 MG (21) TBPK tablet Take by mouth daily. Take 6 tabs by mouth daily  for 2 days, then 5 tabs for 2 days, then 4 tabs for 2 days,  then 3 tabs for 2 days, 2 tabs for 2 days, then 1 tab by mouth daily for 2 days 12/14/16   Barnet Glasgow, NP  triamcinolone cream (KENALOG) 0.1 % Apply 1 application topically 2 (two) times daily. 12/14/16   Barnet Glasgow, NP    Allergies Novocain [procaine hcl]   REVIEW OF SYSTEMS  Negative except as noted here or in the History of Present Illness.   PHYSICAL EXAMINATION  Initial Vital Signs Blood pressure 128/88, pulse 85, temperature 98.1 F (36.7 C), temperature source Oral, resp. rate 16, height 5\' 9"  (1.753 m), weight 90.7 kg (200 lb), last menstrual period 01/30/2017, SpO2 98 %, currently breastfeeding.  Examination General: Well-developed, well-nourished female in no acute distress; appearance consistent with age of record HENT: normocephalic; atraumatic Eyes: Normal appearance Neck: supple; some reproduction of pain in the shoulder on rotation of the neck to the right Heart: regular rate and rhythm Lungs: clear to auscultation bilaterally Abdomen: soft; nondistended; nontender Extremities: No deformity; full range of motion except right shoulder; pulses normal; soft tissue tenderness inferior to right clavicle without crepitus; pain on internal rotation of right shoulder Neurologic: Awake, alert and oriented; motor function intact in all extremities and symmetric; no facial droop Skin: Warm and dry Psychiatric: Normal mood and affect   RESULTS  Summary of this visit's  results, reviewed by myself:   EKG Interpretation  Date/Time:    Ventricular Rate:    PR Interval:    QRS Duration:   QT Interval:    QTC Calculation:   R Axis:     Text Interpretation:        Laboratory Studies: No results found for this or any previous visit (from the past 24 hour(s)). Imaging Studies: Dg Shoulder Right  Result Date: 02/21/2017 CLINICAL DATA:  Right shoulder pain radiating to the right upper chest for 3 days. Limited range of motion. Previous injury 5 years ago.  EXAM: RIGHT SHOULDER - 2+ VIEW COMPARISON:  None. FINDINGS: There is no evidence of fracture or dislocation. There is no evidence of arthropathy or other focal bone abnormality. Soft tissues are unremarkable. IMPRESSION: Negative. Electronically Signed   By: Lucienne Capers M.D.   On: 02/21/2017 01:05    ED COURSE  Nursing notes and initial vitals signs, including pulse oximetry, reviewed.  Vitals:   02/20/17 2219  BP: 128/88  Pulse: 85  Resp: 16  Temp: 98.1 F (36.7 C)  TempSrc: Oral  SpO2: 98%  Weight: 90.7 kg (200 lb)  Height: 5\' 9"  (1.753 m)   I suspect a right rotator cuff injury but the pain with movement of the neck suggest she could also have a cervical radiculopathy.  We will treat with naproxen and refer her to orthopedics.  PROCEDURES    ED DIAGNOSES     ICD-10-CM   1. Right shoulder injury, initial encounter S49.91XA        Tymir Terral, Jenny Reichmann, MD 02/21/17 [phone]

## 2018-01-15 ENCOUNTER — Other Ambulatory Visit: Payer: Self-pay | Admitting: Physician Assistant

## 2018-01-15 ENCOUNTER — Ambulatory Visit
Admission: RE | Admit: 2018-01-15 | Discharge: 2018-01-15 | Disposition: A | Discharge status: Home / Self Care | Payer: Medicaid Other | Source: Ambulatory Visit | Attending: Physician Assistant | Admitting: Physician Assistant

## 2018-01-15 DIAGNOSIS — R05 Cough: Secondary | ICD-10-CM

## 2018-01-15 DIAGNOSIS — R059 Cough, unspecified: Secondary | ICD-10-CM

## 2018-04-26 ENCOUNTER — Other Ambulatory Visit: Payer: Self-pay | Admitting: Physician Assistant

## 2018-04-26 ENCOUNTER — Ambulatory Visit
Admission: RE | Admit: 2018-04-26 | Discharge: 2018-04-26 | Disposition: A | Discharge status: Home / Self Care | Payer: Medicaid Other | Source: Ambulatory Visit | Attending: Physician Assistant | Admitting: Physician Assistant

## 2018-04-26 DIAGNOSIS — S99922D Unspecified injury of left foot, subsequent encounter: Secondary | ICD-10-CM

## 2018-11-06 ENCOUNTER — Other Ambulatory Visit: Payer: Self-pay

## 2018-11-06 DIAGNOSIS — T161XXA Foreign body in right ear, initial encounter: Secondary | ICD-10-CM | POA: Insufficient documentation

## 2018-11-06 DIAGNOSIS — Z79899 Other long term (current) drug therapy: Secondary | ICD-10-CM | POA: Diagnosis not present

## 2018-11-06 DIAGNOSIS — Y939 Activity, unspecified: Secondary | ICD-10-CM | POA: Insufficient documentation

## 2018-11-06 DIAGNOSIS — Y929 Unspecified place or not applicable: Secondary | ICD-10-CM | POA: Diagnosis not present

## 2018-11-06 DIAGNOSIS — X58XXXA Exposure to other specified factors, initial encounter: Secondary | ICD-10-CM | POA: Diagnosis not present

## 2018-11-06 DIAGNOSIS — Y999 Unspecified external cause status: Secondary | ICD-10-CM | POA: Diagnosis not present

## 2018-11-06 DIAGNOSIS — Z87891 Personal history of nicotine dependence: Secondary | ICD-10-CM | POA: Insufficient documentation

## 2018-11-07 ENCOUNTER — Emergency Department (HOSPITAL_COMMUNITY)
Admission: EM | Admit: 2018-11-07 | Discharge: 2018-11-07 | Disposition: A | Discharge status: Home / Self Care | Payer: Medicaid Other | Attending: Emergency Medicine | Admitting: Emergency Medicine

## 2018-11-07 DIAGNOSIS — T161XXA Foreign body in right ear, initial encounter: Secondary | ICD-10-CM

## 2018-11-07 MED ORDER — NEOMYCIN-POLYMYXIN-HC 3.5-10000-1 OT SUSP
4.0000 [drp] | Freq: Four times a day (QID) | OTIC | Status: DC
  Administered 2018-11-07: 4 [drp] via OTIC
  Filled 2018-11-07: qty 10

## 2018-11-07 NOTE — ED Triage Notes (Signed)
Pt had a bug fly into her ear this evening, she flushed her ear and put oil in it pt's ear is very sore and swollen with a little bit of blood

## 2018-11-07 NOTE — ED Provider Notes (Signed)
[place] DEPT Provider Note: Veronica Spurling, MD, FACEP  CSN: [id number] MRN: [id number] ARRIVAL: 11/06/18 at 2326 ROOM: [place]  Foreign Body in [place]  11/07/18 2:16 AM Veronica Obrien Veronica Obrien is a 38 y.o. female who had a bug fly into her right ear yesterday evening.  She flushed her ear and put oil in it but does not believe the bug was removed.  She states her ear is painful, which she rates as a 10 out of 10, and has some blood coming out of it.  The blood, which the charge nurse states with a moth, was removed by her using suction.  The patient's pain has improved significantly but she still has some remaining pain which radiates to the right side of her face.  Pain is somewhat worse with movement of her right ear.  She believes there is a fragment of the mouth left in her ear but declined further irrigation.  She intends to irrigate tomorrow after her pain improves.   Past Medical History:  Diagnosis Date  . Medical history non-contributory     Past Surgical History:  Procedure Laterality Date  . WISDOM TOOTH EXTRACTION      Family History  Problem Relation Age of Onset  . Cancer Sister   . Arthritis Father   . Heart disease Maternal Grandfather   . Diabetes Maternal Grandfather   . Diabetes Paternal Grandmother   . Cancer Paternal Grandmother   . Diabetes Paternal Grandfather     Social History   Tobacco Use  . Smoking status: Former Smoker    Packs/day: 0.00    Types: Cigarettes    Quit date: 06/06/2014    Years since quitting: 4.4  . Smokeless tobacco: Never Used  Substance Use Topics  . Alcohol use: No    Comment: NOT SINCE PREGNANCY  . Drug use: No    Prior to Admission medications   Medication Sig Start Date End Date Taking? Authorizing Provider  hydrOXYzine (ATARAX/VISTARIL) 25 MG tablet Take 1 tablet (25 mg total) by mouth every 6 (six) hours. 12/14/16   Barnet Glasgow, NP  naproxen (NAPROSYN) 500 MG  tablet Take 1 tablet (500 mg total) by mouth 2 (two) times daily as needed (for shoulder pain). 02/21/17   Marna Weniger, MD  triamcinolone cream (KENALOG) 0.1 % Apply 1 application topically 2 (two) times daily. 12/14/16   Barnet Glasgow, NP    Allergies Novocain [procaine hcl]   REVIEW OF SYSTEMS  Negative except as noted here or in the History of Present Illness.   PHYSICAL EXAMINATION  Initial Vital Signs Blood pressure 125/81, pulse 75, temperature 98.2 F (36.8 C), temperature source Oral, resp. rate 20, SpO2 99 %, currently breastfeeding.  Examination General: Well-developed, well-nourished female in no acute distress; appearance consistent with age of record HENT: normocephalic; atraumatic; mild pain with movement of right pinna; inflammation of right external auditory canal with small fragment of what could be an insect partially securing the otherwise unremarkable tympanic membrane, mild erythema of external ear Eyes: Normal appearance Neck: supple Heart: regular rate and rhythm Lungs: clear to auscultation bilaterally Abdomen: soft; nondistended; nontender; bowel sounds present Extremities: No deformity; full range of motion Neurologic: Awake, alert and oriented; motor function intact in all extremities and symmetric; no facial droop Skin: Warm and dry Psychiatric: Normal mood and affect   RESULTS  Summary of this visit's results, reviewed by myself:   EKG Interpretation  Date/Time:  Ventricular Rate:    PR Interval:    QRS Duration:   QT Interval:    QTC Calculation:   R Axis:     Text Interpretation:        Laboratory Studies: No results found for this or any previous visit (from the past 24 hour(s)). Imaging Studies: No results found.  ED COURSE and MDM  Nursing notes and initial vitals signs, including pulse oximetry, reviewed.  Vitals:   11/07/18 0109  BP: 125/81  Pulse: 75  Resp: 20  Temp: 98.2 F (36.8 C)  TempSrc: Oral  SpO2: 99%    Will start Cortisporin otic to prevent secondary infection.  The patient will be referred to ENT for evaluation of possible retained fragments.  PROCEDURES    ED DIAGNOSES     ICD-10-CM   1. Foreign body of right ear, initial encounter  T16.1XXA        Veronica Obrien, Veronica Reichmann, MD 11/07/18 [phone]

## 2018-11-25 DIAGNOSIS — H9201 Otalgia, right ear: Secondary | ICD-10-CM | POA: Insufficient documentation

## 2019-08-26 ENCOUNTER — Ambulatory Visit: Payer: Medicaid Other | Attending: Physician Assistant | Admitting: Physical Therapy

## 2019-08-26 ENCOUNTER — Encounter: Payer: Self-pay | Admitting: Physical Therapy

## 2019-08-26 ENCOUNTER — Other Ambulatory Visit: Payer: Self-pay

## 2019-08-26 DIAGNOSIS — M62838 Other muscle spasm: Secondary | ICD-10-CM

## 2019-08-26 DIAGNOSIS — M542 Cervicalgia: Secondary | ICD-10-CM | POA: Insufficient documentation

## 2019-08-26 DIAGNOSIS — R293 Abnormal posture: Secondary | ICD-10-CM | POA: Insufficient documentation
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
## 2019-09-10 ENCOUNTER — Ambulatory Visit: Payer: Medicaid Other | Admitting: Physical Therapy

## 2019-09-17 ENCOUNTER — Ambulatory Visit: Payer: Medicaid Other | Admitting: Physical Therapy

## 2019-09-24 ENCOUNTER — Ambulatory Visit: Payer: Medicaid Other | Admitting: Physical Therapy

## 2019-10-17 ENCOUNTER — Ambulatory Visit: Payer: Medicaid Other | Attending: Physician Assistant | Admitting: Physical Therapy

## 2019-10-17 ENCOUNTER — Encounter: Payer: Self-pay | Admitting: Physical Therapy

## 2019-10-17 ENCOUNTER — Other Ambulatory Visit: Payer: Self-pay

## 2019-10-17 DIAGNOSIS — G8929 Other chronic pain: Secondary | ICD-10-CM | POA: Diagnosis present

## 2019-10-17 DIAGNOSIS — M542 Cervicalgia: Secondary | ICD-10-CM | POA: Insufficient documentation

## 2019-10-17 DIAGNOSIS — R293 Abnormal posture: Secondary | ICD-10-CM | POA: Diagnosis present

## 2019-10-17 DIAGNOSIS — M25511 Pain in right shoulder: Secondary | ICD-10-CM | POA: Insufficient documentation

## 2019-10-17 DIAGNOSIS — M62838 Other muscle spasm: Secondary | ICD-10-CM | POA: Insufficient documentation

## 2019-10-17 NOTE — Therapy (Signed)
[place], [place], [address] Phone: [phone]   Fax:  [phone]  Physical Therapy Treatment / Re-evaluation  Patient Details  Name: Veronica Obrien MRN: [id number] Date of Birth: Jul 07, 1980 Referring Provider (PT): [name] [place]   Encounter Date: 10/17/2019   PT End of Session - 10/17/19 1048    Visit Number 2    Number of Visits 13    Date for PT Re-Evaluation 11/14/19    Authorization Type MCD: submittedon on 08/26/2019    PT Start Time 1046    PT Stop Time 1125    PT Time Calculation (min) 39 min    Activity Tolerance Patient tolerated treatment well    Behavior During Therapy [facility] for tasks assessed/performed           Past Medical History:  Diagnosis Date  . Medical history non-contributory     Past Surgical History:  Procedure Laterality Date  . WISDOM TOOTH EXTRACTION      There were no vitals filed for this visit.   Subjective Assessment - 10/17/19 1053    Subjective " I wasn't able to attend the last few visits due to having issues being sick and having some potential exposure to COVID which she was tested for later and noted a negatvie result. I am doing great and feeling better I've been doing the exercises and they really help. I saw the Md and they did more x-rays and stated I have a acromioclaiviular sprain and advised me to not move my arm."    Patient Stated Goals to decrease pain, return to normal    Currently in Pain? Yes    Pain Score 4     Pain Location Neck    Pain Orientation Right    Pain Descriptors / Indicators Aching   stiffness   Pain Type Chronic pain    Pain Onset More than a month ago    Pain Frequency Intermittent    Aggravating Factors  sleeping at night, and direct pressure    Pain Relieving Factors getting out of the postion, rubbing              [facility] PT Assessment - 10/17/19 0001      Assessment   Medical Diagnosis cervicalgia    Referring  Provider (PT) Redmon, Noelle PA      AROM   Right Shoulder Flexion 130 Degrees    Right Shoulder ABduction 98 Degrees    Right Shoulder Internal Rotation --   iliac crest   Right Shoulder External Rotation --   T1   Cervical Flexion 40    Cervical Extension 22    Cervical - Right Side Bend 38    Cervical - Left Side Bend 24    Cervical - Right Rotation 60    Cervical - Left Rotation 50      Strength   Right Shoulder Flexion 3+/5    Right Shoulder Extension 3+/5    Right Shoulder ABduction 3-/5    Right Shoulder Internal Rotation 3-/5    Right Shoulder External Rotation 3-/5                         [facility] Adult PT Treatment/Exercise - 10/17/19 0001      Exercises   Exercises Neck;Shoulder      Shoulder Exercises: Standing   External Rotation Strengthening;Both;10 reps;Theraband    Theraband Level (Shoulder External Rotation) Level 2 (Red)  Internal Rotation Strengthening;10 reps;Theraband    Theraband Level (Shoulder Internal Rotation) Level 2 (Red)    Row Strengthening;Both;10 reps;Theraband    Theraband Level (Shoulder Row) Level 2 (Red)      Shoulder Exercises: ROM/Strengthening   Other ROM/Strengthening Exercises wall walks flexion and scaption angle 1 x 5 ea   cues to stay within pain free range     Neck Exercises: Stretches   Upper Trapezius Stretch 1 rep;30 seconds;Right    Levator Stretch 1 rep;Right;30 seconds                  PT Education - 10/17/19 1134    Education Details Reviewed HEP and updated for shoulder AAROM and rotator cuff strengthening. Reviewed POC moving forward.    Person(s) Educated Patient    Methods Explanation;Verbal cues;Handout    Comprehension Verbalized understanding;Verbal cues required            PT Short Term Goals - 10/17/19 1059      PT SHORT TERM GOAL #1   Title pt to be I with inital HEP    Period Weeks    Status Achieved      PT SHORT TERM GOAL #2   Title pt to verbalize and demonstrate  efficient lifting and carrying mechanics to reduce and prevent neck and back pain    Period Weeks    Status Achieved      PT SHORT TERM GOAL #3   Title pt to report decreaesd muscls spasm in the  Rupper trap and surrounding musculature to promote cervical ROM    Period Weeks    Status Achieved             PT Long Term Goals - 10/17/19 1100      PT LONG TERM GOAL #1   Title increase cervical bil rotation and sidebending to [place] with </= 2/10 pain for functional ROM required for safety with driving    Baseline R rotation 60 degrees/ sidebending 38 degrees, L rotation 50 degrees and L sidebending 24 degrees    Time 4    Period Weeks    Status On-going    Target Date 11/14/19      PT LONG TERM GOAL #2   Title increase R shoulder ROM to [facility] with no report of pain in all planes for work related activities and ADL's    Baseline flexion 130, abduction 98 degrees pain noted at end range    Period Weeks    Status On-going    Target Date 11/14/19      PT LONG TERM GOAL #3   Title pt to increase R shoulder strength to >/= 4+/5 for functional lifting and carrying activities required for ADLs.    Baseline 3-/5 for IR/ER and abudction, 3/5 with flexion    Period Weeks    Status On-going    Target Date 11/14/19      PT LONG TERM GOAL #4   Title pt to be I with all HEP given to maintain and progress current level of function    Baseline independent current HEP and progressing as able.    Time 6    Period Weeks    Status On-going    Target Date 11/14/19                 Plan - 10/17/19 1123    Clinical Impression Statement pt returns to PT since her last session on 08/26/2019 due issues with being sick and was unable to  attend her 3 approved MCD visits. She did see her referring provider and was informed she has an [facility] joint sprain, and to continue therapy for her neck and shoulder.  She does report consistency with her HEP and that it has helped with her pain in the shoulder. She  has improved neck ROM but cotninued stiffness in the R upper trap/ levator and TTP around the A/C joint which increases with shoulder IR. She met all STG's and is making progress toward her LTGS and demosntrates motivation to improve. She would benefit from continued physical therapy to reduce neck/ shoulder pain, increase neck/ shoulder ROM/ strength and maximize her function by addressing the deficits listed.    PT Frequency 2x / week    PT Duration 4 weeks    PT Treatment/Interventions ADLs/Self Care Home Management;Cryotherapy;Electrical Stimulation;Iontophoresis 67m/ml Dexamethasone;Moist Heat;Traction;Ultrasound;Therapeutic activities;Therapeutic exercise;Balance training;Neuromuscular re-education;Patient/family education;Manual techniques;Passive range of motion;Dry needling;Vasopneumatic Device;Taping    PT Next Visit Plan review/ update HEP, discuss DN for R upper trap/ levator scapulae / STW PRN . stretching, shoulder ROM/ strengthening,    PT Home Exercise Plan [id number] - upper trap / levator scapulae stretching, supine chin tuck, scapular retraction. wall walks (flexion/ abduction), shoulder IR/ER, rows.    Consulted and Agree with Plan of Care Patient           Patient will benefit from skilled therapeutic intervention in order to improve the following deficits and impairments:  Improper body mechanics, Increased muscle spasms, Decreased strength, Pain, Postural dysfunction, Decreased endurance, Decreased range of motion, Decreased activity tolerance  Visit Diagnosis: Cervicalgia  Other muscle spasm  Abnormal posture  Chronic right shoulder pain     Problem List Patient Active Problem List   Diagnosis Date Noted  . Active preterm labor 03/30/2015  . Gestational diabetes mellitus (GDM) affecting pregnancy, antepartum 03/07/2015  . Placental abruption in third trimester 02/14/2015  . Supervision of high risk pregnancy, antepartum 12/24/2014  . Obesity (BMI 30-39.9)  12/24/2014  . Ovarian cyst in pregnancy 12/24/2014  . Complicated migraine [id number]/29/[id number]  KStarr LakePT, DPT, LAT, ATC  10/17/19  11:38 AM      [facility] [address]Phone: [phone]  Fax:  [phone] Name: KJAZLYN TIPPENSMRN: [id number] of Birth: [birth year]

## 2019-10-24 ENCOUNTER — Other Ambulatory Visit: Payer: Self-pay

## 2019-10-24 ENCOUNTER — Ambulatory Visit: Payer: Medicaid Other | Admitting: Physical Therapy

## 2019-10-24 ENCOUNTER — Encounter: Payer: Self-pay | Admitting: Physical Therapy

## 2019-10-24 DIAGNOSIS — M542 Cervicalgia: Secondary | ICD-10-CM

## 2019-10-24 DIAGNOSIS — M25511 Pain in right shoulder: Secondary | ICD-10-CM

## 2019-10-24 DIAGNOSIS — G8929 Other chronic pain: Secondary | ICD-10-CM

## 2019-10-24 DIAGNOSIS — M62838 Other muscle spasm: Secondary | ICD-10-CM

## 2019-10-24 DIAGNOSIS — R293 Abnormal posture: Secondary | ICD-10-CM

## 2019-10-24 NOTE — Therapy (Signed)
[place], [place], [address] Phone: [phone]   Fax:  [phone]  Physical Therapy Treatment  Patient Details  Name: Veronica Obrien MRN: [id number] Date of Birth: 08-06-1980 Referring Provider (PT): [name] [place]   Encounter Date: 10/24/2019   PT End of Session - 10/24/19 1131    Visit Number 3    Number of Visits 13    Date for PT Re-Evaluation 11/14/19    Authorization Type MCD: submittedon on 08/26/2019    Authorization Time Period UHC - no pre-auth required (27 max visits)    PT Start Time 1046    PT Stop Time 1130    PT Time Calculation (min) 44 min    Activity Tolerance Patient tolerated treatment well    Behavior During Therapy [facility] for tasks assessed/performed           Past Medical History:  Diagnosis Date  . Medical history non-contributory     Past Surgical History:  Procedure Laterality Date  . WISDOM TOOTH EXTRACTION      There were no vitals filed for this visit.   Subjective Assessment - 10/24/19 1051    Subjective " I am doing the exercises and have no pain with the exercises but I am having increased soreness today which I am unsure if its how I slept too."    Currently in Pain? Yes    Pain Score 2     Pain Location Neck    Pain Orientation Right    Pain Descriptors / Indicators Aching    Pain Type Chronic pain    Pain Onset More than a month ago    Pain Frequency Intermittent    Aggravating Factors  sleeping at night,              [facility] PT Assessment - 10/24/19 0001      Assessment   Medical Diagnosis cervicalgia    Referring Provider (PT) [name] PA                         [facility] Adult PT Treatment/Exercise - 10/24/19 0001      Neck Exercises: Machines for Strengthening   UBE (Upper Arm Bike) L 2 x 4 min    fwd/bwd x 2 min     Shoulder Exercises: Standing   Row Strengthening;Both;12 reps;Theraband    Theraband Level (Shoulder  Row) Level 3 (Green)    Other Standing Exercises wall push up with plus 2 x 10   cues/ demonstration for proper form     Shoulder Exercises: Stretch   Other Shoulder Stretches x arm Rhomboid stretch holding on to free motion 2 x 30 sec      Manual Therapy   Manual Therapy Soft tissue mobilization;Joint mobilization    Manual therapy comments skilled palpation and monitoring of pt throughout TPDN    Joint Mobilization T1-T8 PA grade III, L first rib inferior mob grade III    Soft tissue mobilization IASTM along R upper trap/ levator scapulae      Neck Exercises: Stretches   Upper Trapezius Stretch 2 reps;Right;30 seconds    Levator Stretch Right;2 reps;30 seconds            Trigger Point Dry Needling - 10/24/19 0001    Consent Given? Yes    Education Handout Provided Yes    Muscles Treated Head and Neck Upper trapezius    Upper Trapezius Response Twitch reponse elicited;Palpable  increased muscle length                PT Education - 10/24/19 1134    Education Details reviewed muscle anatomy. What TPDN is, benefits and what to expect. able to substitute Wall push up for the supine scapular protraction home exercise.    Person(s) Educated Patient    Methods Explanation;Verbal cues;Handout    Comprehension Verbalized understanding;Verbal cues required            PT Short Term Goals - 10/17/19 1059      PT SHORT TERM GOAL #1   Title pt to be I with inital HEP    Period Weeks    Status Achieved      PT SHORT TERM GOAL #2   Title pt to verbalize and demonstrate efficient lifting and carrying mechanics to reduce and prevent neck and back pain    Period Weeks    Status Achieved      PT SHORT TERM GOAL #3   Title pt to report decreaesd muscls spasm in the  Rupper trap and surrounding musculature to promote cervical ROM    Period Weeks    Status Achieved             PT Long Term Goals - 10/17/19 1100      PT LONG TERM GOAL #1   Title increase cervical bil  rotation and sidebending to [facility] with </= 2/10 pain for functional ROM required for safety with driving    Baseline R rotation 60 degrees/ sidebending 38 degrees, L rotation 50 degrees and L sidebending 24 degrees    Time 4    Period Weeks    Status On-going    Target Date 11/14/19      PT LONG TERM GOAL #2   Title increase R shoulder ROM to [facility] with no report of pain in all planes for work related activities and ADL's    Baseline flexion 130, abduction 98 degrees pain noted at end range    Period Weeks    Status On-going    Target Date 11/14/19      PT LONG TERM GOAL #3   Title pt to increase R shoulder strength to >/= 4+/5 for functional lifting and carrying activities required for ADLs.    Baseline 3-/5 for IR/ER and abudction, 3/5 with flexion    Period Weeks    Status On-going    Target Date 11/14/19      PT LONG TERM GOAL #4   Title pt to be I with all HEP given to maintain and progress current level of function    Baseline independent current HEP and progressing as able.    Time 6    Period Weeks    Status On-going    Target Date 11/14/19                 Plan - 10/24/19 1133    Clinical Impression Statement pt notes soreness in the R shoulder but is unsure if its from exercise vs her sleeping positioning. educated and consent was provided for TPDN focusing on the R upper trap followed with IASTM techniques and thoracic/ rib mobs. continued posterior shoulder strengtheing and stretching. She noted feeling tight in the shoulder but with reduced pain end of session.    PT Treatment/Interventions ADLs/Self Care Home Management;Cryotherapy;Electrical Stimulation;Iontophoresis 4mg /ml Dexamethasone;Moist Heat;Traction;Ultrasound;Therapeutic activities;Therapeutic exercise;Balance training;Neuromuscular re-education;Patient/family education;Manual techniques;Passive range of motion;Dry needling;Vasopneumatic Device;Taping    PT Next Visit Plan review/ update HEP, discuss DN  for  R upper trap/ levator scapulae / STW PRN . stretching, shoulder ROM/ strengthening,    PT Home Exercise Plan [id number] - upper trap / levator scapulae stretching, supine chin tuck, scapular retraction. wall walks (flexion/ abduction), shoulder IR/ER, rows.    Consulted and Agree with Plan of Care Patient           Patient will benefit from skilled therapeutic intervention in order to improve the following deficits and impairments:  Improper body mechanics, Increased muscle spasms, Decreased strength, Pain, Postural dysfunction, Decreased endurance, Decreased range of motion, Decreased activity tolerance  Visit Diagnosis: Cervicalgia  Other muscle spasm  Abnormal posture  Chronic right shoulder pain     Problem List Patient Active Problem List   Diagnosis Date Noted  . Active preterm labor 03/30/2015  . Gestational diabetes mellitus (GDM) affecting pregnancy, antepartum 03/07/2015  . Placental abruption in third trimester 02/14/2015  . Supervision of high risk pregnancy, antepartum 12/24/2014  . Obesity (BMI 30-39.9) 12/24/2014  . Ovarian cyst in pregnancy 12/24/2014  . Complicated migraine 02/22/6243    Starr Lake PT, DPT, LAT, ATC  10/24/19  11:37 AM      [place] [facility] [address] [place], [place], [address] Phone: [phone]   Fax:  [phone]  Name: Veronica Obrien MRN: [id number] Date of Birth: [birth year]

## 2019-10-29 ENCOUNTER — Encounter: Payer: Self-pay | Admitting: Physical Therapy

## 2019-10-29 ENCOUNTER — Other Ambulatory Visit: Payer: Self-pay

## 2019-10-29 ENCOUNTER — Ambulatory Visit: Payer: Medicaid Other | Admitting: Physical Therapy

## 2019-10-29 DIAGNOSIS — M25511 Pain in right shoulder: Secondary | ICD-10-CM

## 2019-10-29 DIAGNOSIS — M542 Cervicalgia: Secondary | ICD-10-CM

## 2019-10-29 DIAGNOSIS — M62838 Other muscle spasm: Secondary | ICD-10-CM

## 2019-10-29 DIAGNOSIS — G8929 Other chronic pain: Secondary | ICD-10-CM

## 2019-10-29 DIAGNOSIS — R293 Abnormal posture: Secondary | ICD-10-CM

## 2019-10-29 NOTE — Therapy (Signed)
[place], [place], [address] Phone: [phone]   Fax:  [phone]  Physical Therapy Treatment  Patient Details  Name: Veronica Obrien MRN: [id number] Date of Birth: [birth year] Referring Provider (PT): [name] [place]   Encounter Date: 10/29/2019   PT End of Session - 10/29/19 1716    Visit Number 4    Number of Visits 13    Date for PT Re-Evaluation 11/14/19    Authorization Time Period UHC - no pre-auth required (27 max visits)    PT Start Time 1712    PT Stop Time 1743    PT Time Calculation (min) 31 min    Activity Tolerance Patient tolerated treatment well    Behavior During Therapy [facility] for tasks assessed/performed           Past Medical History:  Diagnosis Date  . Medical history non-contributory     Past Surgical History:  Procedure Laterality Date  . WISDOM TOOTH EXTRACTION      There were no vitals filed for this visit.   Subjective Assessment - 10/29/19 1711    Subjective Patient reports she went to the beach for a few days and didn't do most of her exercises. Her shoulder does kind of hurt a little bit, and she did feel sore and feels tight now. She doesn't know why it is tight.    Patient Stated Goals to decrease pain, return to normal    Currently in Pain? Yes    Pain Score 3     Pain Location Shoulder    Pain Orientation Right    Pain Descriptors / Indicators Sore    Pain Type Chronic pain    Pain Radiating Towards right neck/shoulder area    Pain Onset More than a month ago    Pain Frequency Intermittent              [facility] PT Assessment - 10/29/19 0001      AROM   Right Shoulder Flexion 130 Degrees    Right Shoulder ABduction 120 Degrees                         [facility] Adult PT Treatment/Exercise - 10/29/19 0001      Neck Exercises: Machines for Strengthening   UBE (Upper Arm Bike) L 2 x 4 min (fwd/bwd)      Neck Exercises: Theraband    Shoulder Extension 15 reps;Red   2 sets   Rows 15 reps;Blue   2 sets   Shoulder External Rotation 15 reps;Red   2 sets   Shoulder Internal Rotation 15 reps;Red   2 sets   Shoulder Internal Rotation Limitations cued to maintain posture    Horizontal ABduction 15 reps   2 sets   Horizontal ABduction Limitations yellow      Shoulder Exercises: Supine   Flexion 10 reps    Flexion Limitations dowel       Shoulder Exercises: Stretch   Wall Stretch - Flexion 5 reps    Wall Stretch - Flexion Limitations wall walk    Wall Stretch - ABduction 5 reps    Wall Stretch - ABduction Limitations wall walk      Neck Exercises: Stretches   Upper Trapezius Stretch 2 reps;20 seconds    Levator Stretch 2 reps;20 seconds                  PT Education - 10/29/19 1715  Education Details HEP    Person(s) Educated Patient    Methods Explanation;Demonstration;Tactile cues;Verbal cues    Comprehension Verbalized understanding;Returned demonstration;Verbal cues required;Tactile cues required;Need further instruction            PT Short Term Goals - 10/17/19 1059      PT SHORT TERM GOAL #1   Title pt to be I with inital HEP    Period Weeks    Status Achieved      PT SHORT TERM GOAL #2   Title pt to verbalize and demonstrate efficient lifting and carrying mechanics to reduce and prevent neck and back pain    Period Weeks    Status Achieved      PT SHORT TERM GOAL #3   Title pt to report decreaesd muscls spasm in the  Rupper trap and surrounding musculature to promote cervical ROM    Period Weeks    Status Achieved             PT Long Term Goals - 10/17/19 1100      PT LONG TERM GOAL #1   Title increase cervical bil rotation and sidebending to [facility] with </= 2/10 pain for functional ROM required for safety with driving    Baseline R rotation 60 degrees/ sidebending 38 degrees, L rotation 50 degrees and L sidebending 24 degrees    Time 4    Period Weeks    Status On-going     Target Date 11/14/19      PT LONG TERM GOAL #2   Title increase R shoulder ROM to [facility] with no report of pain in all planes for work related activities and ADL's    Baseline flexion 130, abduction 98 degrees pain noted at end range    Period Weeks    Status On-going    Target Date 11/14/19      PT LONG TERM GOAL #3   Title pt to increase R shoulder strength to >/= 4+/5 for functional lifting and carrying activities required for ADLs.    Baseline 3-/5 for IR/ER and abudction, 3/5 with flexion    Period Weeks    Status On-going    Target Date 11/14/19      PT LONG TERM GOAL #4   Title pt to be I with all HEP given to maintain and progress current level of function    Baseline independent current HEP and progressing as able.    Time 6    Period Weeks    Status On-going    Target Date 11/14/19                 Plan - 10/29/19 1717    Clinical Impression Statement Patient tolerated therapy well with no adverse effects. Therapy limited due to patient arriving late. She exhibits improved motion and is progressing with her strengthening exercises well. She did not report any increase in pain but did report tightness and weakness of the left shoulder and neck. She would benefit from continued skilled PT to progress mobility and strength to maximize functional level and return to work without limitation.    PT Treatment/Interventions ADLs/Self Care Home Management;Cryotherapy;Electrical Stimulation;Iontophoresis 4mg /ml Dexamethasone;Moist Heat;Traction;Ultrasound;Therapeutic activities;Therapeutic exercise;Balance training;Neuromuscular re-education;Patient/family education;Manual techniques;Passive range of motion;Dry needling;Vasopneumatic Device;Taping    PT Next Visit Plan review/ update HEP, discuss DN for R upper trap/ levator scapulae / STW PRN . stretching, shoulder ROM/ strengthening,    PT Home Exercise Plan [id number] - upper trap / levator scapulae stretching, supine chin tuck,  scapular retraction. wall walks (flexion/ abduction), shoulder IR/ER, rows.    Consulted and Agree with Plan of Care Patient           Patient will benefit from skilled therapeutic intervention in order to improve the following deficits and impairments:  Improper body mechanics, Increased muscle spasms, Decreased strength, Pain, Postural dysfunction, Decreased endurance, Decreased range of motion, Decreased activity tolerance  Visit Diagnosis: Cervicalgia  Other muscle spasm  Abnormal posture  Chronic right shoulder pain     Problem List Patient Active Problem List   Diagnosis Date Noted  . Active preterm labor 03/30/2015  . Gestational diabetes mellitus (GDM) affecting pregnancy, antepartum 03/07/2015  . Placental abruption in third trimester 02/14/2015  . Supervision of high risk pregnancy, antepartum 12/24/2014  . Obesity (BMI 30-39.9) 12/24/2014  . Ovarian cyst in pregnancy 12/24/2014  . Complicated migraine 64/33/2951    Hilda Blades, PT, DPT, LAT, ATC 10/29/19  5:49 PM Phone: [phone] Fax: [place] [facility] [address] [place], [place], [address] Phone: [phone]   Fax:  [phone]  Name: AYLSSA HERRIG MRN: [id number] Date of Birth: 1981-01-03

## 2019-11-03 ENCOUNTER — Other Ambulatory Visit: Payer: Self-pay

## 2019-11-03 ENCOUNTER — Ambulatory Visit: Payer: Medicaid Other | Attending: Physician Assistant | Admitting: Physical Therapy

## 2019-11-03 DIAGNOSIS — M62838 Other muscle spasm: Secondary | ICD-10-CM | POA: Diagnosis present

## 2019-11-03 DIAGNOSIS — M25511 Pain in right shoulder: Secondary | ICD-10-CM | POA: Diagnosis present

## 2019-11-03 DIAGNOSIS — R293 Abnormal posture: Secondary | ICD-10-CM

## 2019-11-03 DIAGNOSIS — M542 Cervicalgia: Secondary | ICD-10-CM | POA: Insufficient documentation

## 2019-11-03 DIAGNOSIS — G8929 Other chronic pain: Secondary | ICD-10-CM

## 2019-11-03 NOTE — Therapy (Signed)
[place], [place], [address] Phone: [phone]   Fax:  [phone]  Physical Therapy Treatment  Patient Details  Name: Veronica Obrien MRN: [id number] Date of Birth: [birth year] Referring Provider (PT): [name] [place]   Encounter Date: 11/03/2019   PT End of Session - 11/03/19 1024    Visit Number 5    Number of Visits 13    Date for PT Re-Evaluation 11/14/19    Authorization Type MCD: submittedon on 08/26/2019    Authorization Time Period UHC - no pre-auth required (27 max visits)    Authorization - Number of Visits 27    PT Start Time 1015    PT Stop Time 1105    PT Time Calculation (min) 50 min           Past Medical History:  Diagnosis Date  . Medical history non-contributory     Past Surgical History:  Procedure Laterality Date  . WISDOM TOOTH EXTRACTION      There were no vitals filed for this visit.   Subjective Assessment - 11/03/19 1026    Subjective " I am feeling still some soreness in the shoulder"    Patient Stated Goals to decrease pain, return to normal    Currently in Pain? Yes    Pain Score 2     Pain Location Shoulder    Pain Orientation Right    Pain Descriptors / Indicators Aching;Sore    Pain Type Chronic pain    Pain Onset More than a month ago    Pain Frequency Intermittent    Aggravating Factors  sleeping at night    Pain Relieving Factors getting out of the position, rubbing    Effect of Pain on Daily Activities limited ROM              [facility] PT Assessment - 11/03/19 0001      Assessment   Medical Diagnosis cervicalgia    Referring Provider (PT) [name] PA                         [facility] Adult PT Treatment/Exercise - 11/03/19 0001      Self-Care   Self-Care Posture    Posture posturing while sleeping to promote relief of the shoulder using pillow under the arm vs keeping the arm in a abdcucted /ER postion all night      Neck  Exercises: Machines for Strengthening   UBE (Upper Arm Bike) L 2 x 4 min (fwd/bwd)      Neck Exercises: Theraband   Horizontal ABduction 15 reps    Horizontal ABduction Limitations yellow      Shoulder Exercises: ROM/Strengthening   UBE (Upper Arm Bike) --      Modalities   Modalities Moist Heat      Moist Heat Therapy   Number Minutes Moist Heat 10 Minutes    Moist Heat Location Cervical      Manual Therapy   Manual Therapy Soft tissue mobilization;Joint mobilization    Manual therapy comments skilled palpation and monitoring of pt throughout TPDN    Joint Mobilization T1-T8 PA grade III, L first rib inferior mob grade III    Soft tissue mobilization IASTM along R upper trap/ levator scapulae      Neck Exercises: Stretches   Upper Trapezius Stretch 2 reps;20 seconds    Levator Stretch 2 reps;20 seconds  Trigger Point Dry Needling - 11/03/19 0001    Consent Given? Yes    Education Handout Provided Previously provided    Muscles Treated Head and Neck Upper trapezius;Levator scapulae    Upper Trapezius Response Twitch reponse elicited;Palpable increased muscle length   R   Levator Scapulae Response Twitch response elicited;Palpable increased muscle length   R                 PT Short Term Goals - 10/17/19 1059      PT SHORT TERM GOAL #1   Title pt to be I with inital HEP    Period Weeks    Status Achieved      PT SHORT TERM GOAL #2   Title pt to verbalize and demonstrate efficient lifting and carrying mechanics to reduce and prevent neck and back pain    Period Weeks    Status Achieved      PT SHORT TERM GOAL #3   Title pt to report decreaesd muscls spasm in the  Rupper trap and surrounding musculature to promote cervical ROM    Period Weeks    Status Achieved             PT Long Term Goals - 10/17/19 1100      PT LONG TERM GOAL #1   Title increase cervical bil rotation and sidebending to [facility] with </= 2/10 pain for functional ROM  required for safety with driving    Baseline R rotation 60 degrees/ sidebending 38 degrees, L rotation 50 degrees and L sidebending 24 degrees    Time 4    Period Weeks    Status On-going    Target Date 11/14/19      PT LONG TERM GOAL #2   Title increase R shoulder ROM to [facility] with no report of pain in all planes for work related activities and ADL's    Baseline flexion 130, abduction 98 degrees pain noted at end range    Period Weeks    Status On-going    Target Date 11/14/19      PT LONG TERM GOAL #3   Title pt to increase R shoulder strength to >/= 4+/5 for functional lifting and carrying activities required for ADLs.    Baseline 3-/5 for IR/ER and abudction, 3/5 with flexion    Period Weeks    Status On-going    Target Date 11/14/19      PT LONG TERM GOAL #4   Title pt to be I with all HEP given to maintain and progress current level of function    Baseline independent current HEP and progressing as able.    Time 6    Period Weeks    Status On-going    Target Date 11/14/19                 Plan - 11/03/19 1235    Clinical Impression Statement pt reports continues stiffness in the neck/ shoulder. Continued TPDN focusing on the R upper trap followed with IASTM techniques and mobs. continued working on posterior shoulder strengthening which she responded well to. utilized MHp end of session to calm down sorenss following DN.    PT Treatment/Interventions ADLs/Self Care Home Management;Cryotherapy;Electrical Stimulation;Iontophoresis 4mg /ml Dexamethasone;Moist Heat;Traction;Ultrasound;Therapeutic activities;Therapeutic exercise;Balance training;Neuromuscular re-education;Patient/family education;Manual techniques;Passive range of motion;Dry needling;Vasopneumatic Device;Taping    PT Next Visit Plan review/ update HEP, discuss DN for R upper trap/ levator scapulae / STW PRN . stretching, shoulder ROM/ strengthening,    PT Home Exercise Plan  [id number] - upper trap / levator  scapulae stretching, supine chin tuck, scapular retraction. wall walks (flexion/ abduction), shoulder IR/ER, rows.    Consulted and Agree with Plan of Care Patient           Patient will benefit from skilled therapeutic intervention in order to improve the following deficits and impairments:  Improper body mechanics, Increased muscle spasms, Decreased strength, Pain, Postural dysfunction, Decreased endurance, Decreased range of motion, Decreased activity tolerance  Visit Diagnosis: Cervicalgia  Other muscle spasm  Abnormal posture  Chronic right shoulder pain     Problem List Patient Active Problem List   Diagnosis Date Noted  . Active preterm labor 03/30/2015  . Gestational diabetes mellitus (GDM) affecting pregnancy, antepartum 03/07/2015  . Placental abruption in third trimester 02/14/2015  . Supervision of high risk pregnancy, antepartum 12/24/2014  . Obesity (BMI 30-39.9) 12/24/2014  . Ovarian cyst in pregnancy 12/24/2014  . Complicated migraine 33/29/5188    Starr Lake PT, DPT, LAT, ATC  11/03/19  12:39 PM      [place] [facility] [address] [place], [place], [address] Phone: [phone]   Fax:  [phone]  Name: ODALIS JORDAN MRN: [id number] Date of Birth: 08/30/1980

## 2019-11-06 ENCOUNTER — Other Ambulatory Visit: Payer: Self-pay

## 2019-11-06 ENCOUNTER — Encounter: Payer: Self-pay | Admitting: Physical Therapy

## 2019-11-06 ENCOUNTER — Ambulatory Visit: Payer: Medicaid Other | Admitting: Physical Therapy

## 2019-11-06 DIAGNOSIS — M542 Cervicalgia: Secondary | ICD-10-CM

## 2019-11-06 DIAGNOSIS — R293 Abnormal posture: Secondary | ICD-10-CM

## 2019-11-06 DIAGNOSIS — G8929 Other chronic pain: Secondary | ICD-10-CM

## 2019-11-06 DIAGNOSIS — M62838 Other muscle spasm: Secondary | ICD-10-CM

## 2019-11-06 NOTE — Therapy (Signed)
[place], [place], [address] Phone: [phone]   Fax:  [phone]  Physical Therapy Treatment  Patient Details  Name: Veronica Obrien MRN: [id number] Date of Birth: [birth year] Referring Provider (PT): [name] [place]   Encounter Date: 11/06/2019   PT End of Session - 11/06/19 1021    Visit Number 6    Number of Visits 13    Date for PT Re-Evaluation 11/14/19    Authorization Type MCD: submittedon on 08/26/2019    Authorization Time Period UHC - no pre-auth required (27 max visits)    Authorization - Visit Number 5    PT Start Time 6160    PT Stop Time 1057    PT Time Calculation (min) 39 min    Activity Tolerance Patient tolerated treatment well    Behavior During Therapy [facility] for tasks assessed/performed           Past Medical History:  Diagnosis Date  . Medical history non-contributory     Past Surgical History:  Procedure Laterality Date  . WISDOM TOOTH EXTRACTION      There were no vitals filed for this visit.   Subjective Assessment - 11/06/19 1021    Subjective " the DN the helped with stiffness. I still wake up laying on the R side."    Patient Stated Goals to decrease pain, return to normal    Currently in Pain? Yes    Pain Score 1     Pain Location Shoulder    Pain Orientation Right    Pain Type Chronic pain    Pain Onset More than a month ago    Pain Frequency Intermittent              [facility] PT Assessment - 11/06/19 0001      Assessment   Medical Diagnosis cervicalgia    Referring Provider (PT) [name] PA                         [facility] Adult PT Treatment/Exercise - 11/06/19 0001      Self-Care   Self-Care Other Self-Care Comments    Other Self-Care Comments  how to perform trigger point at home using theracane      Neck Exercises: Machines for Strengthening   Nustep L6 x 6 min UE/LE      Shoulder Exercises: Standing   External Rotation  Strengthening;20 reps;Theraband    Theraband Level (Shoulder External Rotation) Level 4 (Blue)    Internal Rotation Strengthening;20 reps;Theraband    Theraband Level (Shoulder Internal Rotation) Level 4 (Blue)    Flexion Strengthening;10 reps;Other (comment)    Flexion Limitations elbows onto pink foam roller ont he wall rolling it up/down while maintaining protracted pos.     Extension 20 reps;Theraband    Theraband Level (Shoulder Extension) Level 4 (Blue)    Row 20 reps;Theraband    Theraband Level (Shoulder Row) Level 4 (Blue)    Other Standing Exercises scaption 1 x 15 bil with 2#      Shoulder Exercises: ROM/Strengthening   UBE (Upper Arm Bike) L3 x 6 min    fwd / bwd x 3 min     Neck Exercises: Stretches   Upper Trapezius Stretch 2 reps;20 seconds    Levator Stretch 2 reps;20 seconds                    PT Short Term Goals - 10/17/19 1059  PT SHORT TERM GOAL #1   Title pt to be I with inital HEP    Period Weeks    Status Achieved      PT SHORT TERM GOAL #2   Title pt to verbalize and demonstrate efficient lifting and carrying mechanics to reduce and prevent neck and back pain    Period Weeks    Status Achieved      PT SHORT TERM GOAL #3   Title pt to report decreaesd muscls spasm in the  Rupper trap and surrounding musculature to promote cervical ROM    Period Weeks    Status Achieved             PT Long Term Goals - 10/17/19 1100      PT LONG TERM GOAL #1   Title increase cervical bil rotation and sidebending to [facility] with </= 2/10 pain for functional ROM required for safety with driving    Baseline R rotation 60 degrees/ sidebending 38 degrees, L rotation 50 degrees and L sidebending 24 degrees    Time 4    Period Weeks    Status On-going    Target Date 11/14/19      PT LONG TERM GOAL #2   Title increase R shoulder ROM to [facility] with no report of pain in all planes for work related activities and ADL's    Baseline flexion 130, abduction 98  degrees pain noted at end range    Period Weeks    Status On-going    Target Date 11/14/19      PT LONG TERM GOAL #3   Title pt to increase R shoulder strength to >/= 4+/5 for functional lifting and carrying activities required for ADLs.    Baseline 3-/5 for IR/ER and abudction, 3/5 with flexion    Period Weeks    Status On-going    Target Date 11/14/19      PT LONG TERM GOAL #4   Title pt to be I with all HEP given to maintain and progress current level of function    Baseline independent current HEP and progressing as able.    Time 6    Period Weeks    Status On-going    Target Date 11/14/19                 Plan - 11/06/19 1055    Clinical Impression Statement pt reports 1/10 pain today. reviewed self trigger point release techniques and tools that can assist with self treatment. focused session on shoulder strengthening with increased reps to promote endurance. she performed exercises well with  only 1/10 pain followeing session. discussed with pt that if she conitnues to do well may only need 1-2 more visits to finalize HEP and addressing any questions before discharging from PT.    PT Treatment/Interventions ADLs/Self Care Home Management;Cryotherapy;Electrical Stimulation;Iontophoresis 4mg /ml Dexamethasone;Moist Heat;Traction;Ultrasound;Therapeutic activities;Therapeutic exercise;Balance training;Neuromuscular re-education;Patient/family education;Manual techniques;Passive range of motion;Dry needling;Vasopneumatic Device;Taping    PT Next Visit Plan review/ update HEP,  stretching, gross shoulder strengthening/ scapular stability.    PT Home Exercise Plan [id number] - upper trap / levator scapulae stretching, supine chin tuck, scapular retraction. wall walks (flexion/ abduction), shoulder IR/ER, rows.    Consulted and Agree with Plan of Care Patient           Patient will benefit from skilled therapeutic intervention in order to improve the following deficits and  impairments:  Improper body mechanics, Increased muscle spasms, Decreased strength, Pain, Postural dysfunction, Decreased endurance, Decreased range  of motion, Decreased activity tolerance  Visit Diagnosis: Cervicalgia  Other muscle spasm  Abnormal posture  Chronic right shoulder pain     Problem List Patient Active Problem List   Diagnosis Date Noted  . Active preterm labor 03/30/2015  . Gestational diabetes mellitus (GDM) affecting pregnancy, antepartum 03/07/2015  . Placental abruption in third trimester 02/14/2015  . Supervision of high risk pregnancy, antepartum 12/24/2014  . Obesity (BMI 30-39.9) 12/24/2014  . Ovarian cyst in pregnancy 12/24/2014  . Complicated migraine 94/85/4627   Starr Lake PT, DPT, LAT, ATC  11/06/19  10:59 AM      [facility] [address] [place], [place], [address] Phone: [phone]   Fax:  [phone]  Name: ANNIKAH LOVINS MRN: [id number] Date of Birth: Sep 10, 1980

## 2019-11-10 ENCOUNTER — Ambulatory Visit: Payer: Medicaid Other | Admitting: Physical Therapy

## 2019-11-13 ENCOUNTER — Encounter: Payer: Self-pay | Admitting: Physical Therapy

## 2019-11-13 ENCOUNTER — Other Ambulatory Visit: Payer: Self-pay

## 2019-11-13 ENCOUNTER — Ambulatory Visit: Payer: Medicaid Other | Admitting: Physical Therapy

## 2019-11-13 DIAGNOSIS — R293 Abnormal posture: Secondary | ICD-10-CM

## 2019-11-13 DIAGNOSIS — M542 Cervicalgia: Secondary | ICD-10-CM

## 2019-11-13 DIAGNOSIS — M62838 Other muscle spasm: Secondary | ICD-10-CM

## 2019-11-13 DIAGNOSIS — G8929 Other chronic pain: Secondary | ICD-10-CM

## 2019-11-13 NOTE — Patient Instructions (Signed)
Access Code: [email or web address] URL: [email or web address] Date: 11/13/2019 Prepared by: Hilda Blades  Exercises Seated Upper Trapezius Stretch - 2 x daily - 7 x weekly - 2 sets - 2 reps - 30 seconds hold Gentle Levator Scapulae Stretch - 1 x daily - 7 x weekly - 2 sets - 2 reps - 30 seconds hold Supine Chin Tuck with Towel - 1 x daily - 7 x weekly - 10 reps - 2 sets - 5 hold Seated Scapular Retraction - 1 x daily - 7 x weekly - 10 reps - 2 sets - 5 seconds hold Standing Shoulder Flexion Wall Walk - 1 x daily - 7 x weekly - 2 sets - 10 reps Standing Shoulder Scaption Wall Walk - 1 x daily - 7 x weekly - 2 sets - 10 reps Standing Shoulder Row with Anchored Resistance - 1 x daily - 7 x weekly - 2 sets - 20 reps Shoulder External Rotation - 1 x daily - 7 x weekly - 2 sets - 20 reps Shoulder Internal Rotation - 1 x daily - 7 x weekly - 2 sets - 20 reps Standing Row with Resistance with Anchored Resistance at Chest Height Palms Down - 1 x daily - 7 x weekly - 2 sets - 15 reps Standing Shoulder Horizontal Abduction with Resistance - 1 x daily - 7 x weekly - 2 sets - 15 reps Scaption with Dumbbells - 1 x daily - 7 x weekly - 2 sets - 15 reps Wall Push Up - 1 x daily - 7 x weekly - 2 sets - 10 reps

## 2019-11-13 NOTE — Therapy (Signed)
[place], [place], [address] Phone: [phone]   Fax:  [phone]  Physical Therapy Treatment  Patient Details  Name: Veronica Obrien MRN: [id number] Date of Birth: 14-Apr-1980 Referring Provider (Veronica Obrien): [name] [place]   Encounter Date: 11/13/2019   Veronica Obrien End of Session - 11/13/19 1141    Visit Number 7    Number of Visits 13    Date for Veronica Obrien Re-Evaluation 11/14/19    Authorization Type UHC MCD    Authorization - Visit Number 6    Authorization - Number of Visits 27    Veronica Obrien Start Time 6962    Veronica Obrien Stop Time 1215    Veronica Obrien Time Calculation (min) 39 min    Activity Tolerance Patient tolerated treatment well    Behavior During Therapy [facility] for tasks assessed/performed           Past Medical History:  Diagnosis Date  . Medical history non-contributory     Past Surgical History:  Procedure Laterality Date  . WISDOM TOOTH EXTRACTION      There were no vitals filed for this visit.   Subjective Assessment - 11/13/19 1138    Subjective Patient reports she has been busy. She starts back with physical work this afternoon. States that her shoulder feels pretty good, she is having some discomfort in the bend of her elbow.    Patient Stated Goals to decrease pain, return to normal    Currently in Pain? Yes    Pain Score 1    Patient states 0-1/10   Pain Location Shoulder    Pain Orientation Right    Pain Descriptors / Indicators Sore    Pain Type Chronic pain    Pain Onset More than a month ago    Pain Frequency Intermittent                             [facility] Adult Veronica Obrien Treatment/Exercise - 11/13/19 0001      Exercises   Exercises Neck;Shoulder      Neck Exercises: Machines for Strengthening   Nustep L6 x 4 min UE/LE      Shoulder Exercises: Standing   Protraction Limitations Wall push-ups 2x15    Horizontal ABduction 15 reps   2 sets   Theraband Level (Shoulder Horizontal ABduction)  Level 2 (Red)    External Rotation 20 reps   2 sets   Theraband Level (Shoulder External Rotation) Level 3 (Green)    Internal Rotation 20 reps   2 sets   Theraband Level (Shoulder Internal Rotation) Level 3 (Green)    Extension 20 reps   2 sets   Theraband Level (Shoulder Extension) Level 3 (Green)    Row 20 reps   2 sets   Theraband Level (Shoulder Row) Level 4 (Blue)    Other Standing Exercises High row at 90 deg abd with green 2x20    Other Standing Exercises Scaption 2x15 with 3#                  Veronica Obrien Education - 11/13/19 1141    Education Details HEP    Person(s) Educated Patient    Methods Explanation;Demonstration;Verbal cues    Comprehension Verbalized understanding;Returned demonstration;Verbal cues required;Need further instruction            Veronica Obrien Short Term Goals - 10/17/19 1059      Veronica Obrien SHORT TERM GOAL #1   Title Veronica Obrien to  be I with inital HEP    Period Weeks    Status Achieved      Veronica Obrien SHORT TERM GOAL #2   Title Veronica Obrien to verbalize and demonstrate efficient lifting and carrying mechanics to reduce and prevent neck and back pain    Period Weeks    Status Achieved      Veronica Obrien SHORT TERM GOAL #3   Title Veronica Obrien to report decreaesd muscls spasm in the  Rupper trap and surrounding musculature to promote cervical ROM    Period Weeks    Status Achieved             Veronica Obrien Long Term Goals - 10/17/19 1100      Veronica Obrien LONG TERM GOAL #1   Title increase cervical bil rotation and sidebending to [facility] with </= 2/10 pain for functional ROM required for safety with driving    Baseline R rotation 60 degrees/ sidebending 38 degrees, L rotation 50 degrees and L sidebending 24 degrees    Time 4    Period Weeks    Status On-going    Target Date 11/14/19      Veronica Obrien LONG TERM GOAL #2   Title increase R shoulder ROM to [facility] with no report of pain in all planes for work related activities and ADL's    Baseline flexion 130, abduction 98 degrees pain noted at end range    Period Weeks    Status  On-going    Target Date 11/14/19      Veronica Obrien LONG TERM GOAL #3   Title Veronica Obrien to increase R shoulder strength to >/= 4+/5 for functional lifting and carrying activities required for ADLs.    Baseline 3-/5 for IR/ER and abudction, 3/5 with flexion    Period Weeks    Status On-going    Target Date 11/14/19      Veronica Obrien LONG TERM GOAL #4   Title Veronica Obrien to be I with all HEP given to maintain and progress current level of function    Baseline independent current HEP and progressing as able.    Time 6    Period Weeks    Status On-going    Target Date 11/14/19                 Plan - 11/13/19 1142    Clinical Impression Statement Patient tolerated therapy well with no adverse effects. Therapy focused on progressing strengthening this visit with good tolerance. She required occasional cueing for posture and slow/controlled movement. She did report fatigue of the right shoulder but seems to be progressing well. She does start back at work today and was encouraged to focus on proper posture. She would benefit from continued skilled Veronica Obrien to progress mobility and strength to maximize functional level and return to work without limitation.    Veronica Obrien Treatment/Interventions ADLs/Self Care Home Management;Cryotherapy;Electrical Stimulation;Iontophoresis 4mg /ml Dexamethasone;Moist Heat;Traction;Ultrasound;Therapeutic activities;Therapeutic exercise;Balance training;Neuromuscular re-education;Patient/family education;Manual techniques;Passive range of motion;Dry needling;Vasopneumatic Device;Taping    Veronica Obrien Next Visit Plan review/ update HEP,  stretching, gross shoulder strengthening/ scapular stability.    Veronica Obrien Home Exercise Plan [id number] - upper trap / levator scapulae stretching, supine chin tuck, wall walks (flexion/ abduction), shoulder IR/ER, rows, high rows, wall push-up, horiz abduction    Consulted and Agree with Plan of Care Patient           Patient will benefit from skilled therapeutic intervention in order to  improve the following deficits and impairments:  Improper body mechanics, Increased muscle spasms, Decreased strength, Pain, Postural dysfunction,  Decreased endurance, Decreased range of motion, Decreased activity tolerance  Visit Diagnosis: Cervicalgia  Other muscle spasm  Abnormal posture  Chronic right shoulder pain     Problem List Patient Active Problem List   Diagnosis Date Noted  . Active preterm labor 03/30/2015  . Gestational diabetes mellitus (GDM) affecting pregnancy, antepartum 03/07/2015  . Placental abruption in third trimester 02/14/2015  . Supervision of high risk pregnancy, antepartum 12/24/2014  . Obesity (BMI 30-39.9) 12/24/2014  . Ovarian cyst in pregnancy 12/24/2014  . Complicated migraine 76/81/1572    Veronica Obrien, Veronica Obrien, Veronica Obrien, Veronica Obrien, Veronica Obrien 11/13/19  12:21 PM Phone: [phone] Fax: [facility] [address] [place], [place], [address] Phone: [phone]   Fax:  [phone]  Name: Veronica Obrien MRN: [id number] Date of Birth: 19-Nov-1980

## 2019-11-18 ENCOUNTER — Ambulatory Visit: Payer: Medicaid Other | Admitting: Physical Therapy

## 2019-11-18 ENCOUNTER — Encounter: Payer: Self-pay | Admitting: Physical Therapy

## 2019-11-18 ENCOUNTER — Other Ambulatory Visit: Payer: Self-pay

## 2019-11-18 DIAGNOSIS — M62838 Other muscle spasm: Secondary | ICD-10-CM

## 2019-11-18 DIAGNOSIS — M542 Cervicalgia: Secondary | ICD-10-CM | POA: Diagnosis not present

## 2019-11-18 DIAGNOSIS — R293 Abnormal posture: Secondary | ICD-10-CM

## 2019-11-18 DIAGNOSIS — G8929 Other chronic pain: Secondary | ICD-10-CM

## 2019-11-18 DIAGNOSIS — M25511 Pain in right shoulder: Secondary | ICD-10-CM

## 2019-11-19 NOTE — Therapy (Signed)
[place], [place], [address] Phone: [phone]   Fax:  [phone]  Physical Therapy Treatment  Patient Details  Name: Veronica Obrien MRN: [id number] Date of Birth: 1980/05/24 Referring Provider (PT): [name] [place]   Encounter Date: 11/18/2019   PT End of Session - 11/18/19 1714    Visit Number 8    Number of Visits 14    Date for PT Re-Evaluation 12/30/19    Authorization Type UHC [place] - no pre-auth required (27 max visits)    Authorization - Visit Number 7    Authorization - Number of Visits 27    PT Start Time 5188    PT Stop Time 1745    PT Time Calculation (min) 40 min    Activity Tolerance Patient tolerated treatment well    Behavior During Therapy WFL for tasks assessed/performed           Past Medical History:  Diagnosis Date  . Medical history non-contributory     Past Surgical History:  Procedure Laterality Date  . WISDOM TOOTH EXTRACTION      There were no vitals filed for this visit.   Subjective Assessment - 11/18/19 1711    Subjective Patient reports she has started back to work and she is sore more on the upper part of the bicep. She has been having some neck soreness from sleeping on her neck wrong. She states everthing seems to be doing good.    Patient Stated Goals to decrease pain, return to normal    Currently in Pain? Yes    Pain Score 3     Pain Location Neck    Pain Orientation Right    Pain Descriptors / Indicators Tightness    Pain Type Chronic pain    Pain Onset More than a month ago    Pain Frequency Intermittent              [facility] PT Assessment - 11/19/19 0001      Assessment   Medical Diagnosis cervicalgia    Referring Provider (PT) [name] PA    Onset Date/Surgical Date 07/31/19      Precautions   Precautions None      Restrictions   Weight Bearing Restrictions No      Balance Screen   Has the  patient fallen in the past 6 months No      Prior Function   Level of Independence Independent    Vocation Requirements Driving bus, lifting/pulling/pushing at [employer]      Observation/Other Assessments   Focus on Therapeutic Outcomes (FOTO)  NA - MCD      AROM   Overall AROM Comments Patient reports tightness at end range    Right Shoulder Flexion 155 Degrees    Left Shoulder Flexion 160 Degrees    Cervical - Right Side Bend 45    Cervical - Left Side Bend 40    Cervical - Right Rotation 65    Cervical - Left Rotation 60      Strength   Overall Strength Comments Rght shoulder strength grossly 4/5 MMT throughout                         [facility] Adult PT Treatment/Exercise - 11/19/19 0001      Exercises   Exercises Neck;Shoulder      Neck Exercises: Machines for Strengthening   Nustep L6  x 4 min UE/LE      Shoulder Exercises: Supine   Horizontal ABduction 20 reps   2 sets   Theraband Level (Shoulder Horizontal ABduction) Level 2 (Red)    Diagonals 20 reps   2 sets   Theraband Level (Shoulder Diagonals) Level 2 (Red)      Shoulder Exercises: Standing   Protraction Limitations Wall push-ups 2x15    External Rotation 20 reps   2 sets   Theraband Level (Shoulder External Rotation) Level 3 (Green)    Extension 20 reps   2 sets   Theraband Level (Shoulder Extension) Level 3 (Green)    Row 20 reps   2 sets   Theraband Level (Shoulder Row) Level 4 (Blue)    Other Standing Exercises High row at 90 deg abd with green 2x20      Manual Therapy   Manual Therapy Myofascial release;Manual Traction;Passive ROM    Myofascial Release Suboccipital release    Passive ROM Right upper trap stretch    Manual Traction Suboccipital release                  PT Education - 11/18/19 1713    Education Details HEP, stretching, posture, updated POC    Person(s) Educated Patient    Methods Explanation;Demonstration;Verbal cues    Comprehension Verbalized  understanding;Returned demonstration;Verbal cues required;Need further instruction            PT Short Term Goals - 10/17/19 1059      PT SHORT TERM GOAL #1   Title pt to be I with inital HEP    Period Weeks    Status Achieved      PT SHORT TERM GOAL #2   Title pt to verbalize and demonstrate efficient lifting and carrying mechanics to reduce and prevent neck and back pain    Period Weeks    Status Achieved      PT SHORT TERM GOAL #3   Title pt to report decreaesd muscls spasm in the  Rupper trap and surrounding musculature to promote cervical ROM    Period Weeks    Status Achieved             PT Long Term Goals - 11/19/19 0801      PT LONG TERM GOAL #1   Title increase cervical bil rotation and sidebending to [facility] with </= 2/10 pain for functional ROM required for safety with driving    Baseline R rotation 65 degrees and R sidebending 45 degrees, L rotation 60 degrees and L sidebending 40 degrees    Time 6    Period Weeks    Status On-going    Target Date 12/30/19      PT LONG TERM GOAL #2   Title increase R shoulder ROM to [facility] with no report of pain in all planes for work related activities and ADL's    Baseline Patient exhibits slightly limited AROM but continued to have tightness at end rangfe    Time 6    Period Weeks    Status On-going    Target Date 12/30/19      PT LONG TERM GOAL #3   Title pt to increase R shoulder strength to >/= 4+/5 for functional lifting and carrying activities required for ADLs.    Baseline Right shoulder strength grossly 4/5 MMT    Time 6    Period Weeks    Status On-going    Target Date 12/30/19      PT LONG TERM  GOAL #4   Title pt to be I with all HEP given to maintain and progress current level of function    Baseline Independent current HEP and progressing as able.    Time 6    Period Weeks    Status On-going    Target Date 12/30/19                 Plan - 11/18/19 1726    Clinical Impression Statement Patient  tolerated therapy well with no adverse effects. Patient is progressing well toward her goals but since she has started back to work reports continues right sided neck tightness and shoulder soreness. She exhibits improved motion and strength and states overall better functional ability. She would benefit from continued skilled PT to progress mobility and strength to maximize functional level and return to work without limitation or pain.    Personal Factors and Comorbidities Time since onset of injury/illness/exacerbation;Profession    Examination-Activity Limitations Lift;Carry    Examination-Participation Restrictions Driving;Occupation    Rehab Potential Good    PT Frequency 1x / week    PT Duration 6 weeks    PT Treatment/Interventions ADLs/Self Care Home Management;Cryotherapy;Electrical Stimulation;Iontophoresis 4mg /ml Dexamethasone;Moist Heat;Traction;Ultrasound;Therapeutic activities;Therapeutic exercise;Balance training;Neuromuscular re-education;Patient/family education;Manual techniques;Passive range of motion;Dry needling;Vasopneumatic Device;Taping    PT Next Visit Plan review/ update HEP,  stretching and manual as needed, gross shoulder strengthening/ scapular stability    PT Home Exercise Plan [id number] - upper trap / levator scapulae stretching, supine chin tuck, wall walks (flexion/ abduction), shoulder IR/ER, rows, high rows, wall push-up, horiz abduction    Consulted and Agree with Plan of Care Patient           Patient will benefit from skilled therapeutic intervention in order to improve the following deficits and impairments:  Improper body mechanics, Increased muscle spasms, Decreased strength, Pain, Postural dysfunction, Decreased endurance, Decreased range of motion, Decreased activity tolerance  Visit Diagnosis: Cervicalgia  Other muscle spasm  Abnormal posture  Chronic right shoulder pain     Problem List Patient Active Problem List   Diagnosis Date Noted  .  Active preterm labor 03/30/2015  . Gestational diabetes mellitus (GDM) affecting pregnancy, antepartum 03/07/2015  . Placental abruption in third trimester 02/14/2015  . Supervision of high risk pregnancy, antepartum 12/24/2014  . Obesity (BMI 30-39.9) 12/24/2014  . Ovarian cyst in pregnancy 12/24/2014  . Complicated migraine 79/89/2119    Hilda Blades, PT, DPT, LAT, ATC 11/19/19  8:10 AM Phone: [phone] Fax: [facility] [address] [place], [place], [address] Phone: [phone]   Fax:  [phone]  Name: Veronica Obrien MRN: [id number] Date of Birth: July 11, 1980

## 2019-11-20 ENCOUNTER — Ambulatory Visit: Payer: Medicaid Other | Admitting: Physical Therapy

## 2019-11-24 ENCOUNTER — Telehealth: Payer: Self-pay | Admitting: Physical Therapy

## 2019-11-24 NOTE — Telephone Encounter (Signed)
Patient contacted PT regarding missed PT appointment. She stated she was stuck at work and was unable to come. Patient reminded of next scheduled appointments and attendance policy. Patient expressed understanding.   Hilda Blades, PT, DPT, LAT, ATC 11/24/19  10:15 AM Phone: [phone] Fax: [phone]

## 2019-11-24 NOTE — Telephone Encounter (Signed)
Attempted to contact patient in regard to missed PT appointment on 11/20/2019. LVM informing patient of missed appointment and reminding her of next scheduled appointment on 12/05/2019, and of attendance policy.   Hilda Blades, PT, DPT, LAT, ATC 11/24/19  10:04 AM Phone: [phone] Fax: [phone]

## 2019-12-05 ENCOUNTER — Other Ambulatory Visit: Payer: Self-pay

## 2019-12-05 ENCOUNTER — Ambulatory Visit: Payer: Medicaid Other | Attending: Physician Assistant | Admitting: Physical Therapy

## 2019-12-05 ENCOUNTER — Encounter: Payer: Self-pay | Admitting: Physical Therapy

## 2019-12-05 DIAGNOSIS — M25511 Pain in right shoulder: Secondary | ICD-10-CM | POA: Diagnosis present

## 2019-12-05 DIAGNOSIS — R293 Abnormal posture: Secondary | ICD-10-CM

## 2019-12-05 DIAGNOSIS — M62838 Other muscle spasm: Secondary | ICD-10-CM

## 2019-12-05 DIAGNOSIS — G8929 Other chronic pain: Secondary | ICD-10-CM

## 2019-12-05 DIAGNOSIS — M542 Cervicalgia: Secondary | ICD-10-CM

## 2019-12-05 NOTE — Therapy (Signed)
[place], [place], [address] Phone: [phone]   Fax:  [phone]  Physical Therapy Treatment  Patient Details  Name: Veronica Obrien MRN: [id number] Date of Birth: 11/22/1980 Referring Provider (PT): [name] [place]   Encounter Date: 12/05/2019   PT End of Session - 12/05/19 1103    Visit Number 9    Number of Visits 14    Date for PT Re-Evaluation 12/30/19    Authorization Type [employer] [place] - no pre-auth required (27 max visits)    Authorization - Visit Number 8    Authorization - Number of Visits 27    PT Start Time 1103    PT Stop Time 1141    PT Time Calculation (min) 38 min    Activity Tolerance Patient tolerated treatment well    Behavior During Therapy WFL for tasks assessed/performed           Past Medical History:  Diagnosis Date  . Medical history non-contributory     Past Surgical History:  Procedure Laterality Date  . WISDOM TOOTH EXTRACTION      There were no vitals filed for this visit.   Subjective Assessment - 12/05/19 1103    Subjective "I have been having increased tension since i've been doing more work. I have been doing the exercises and stretches"    Patient Stated Goals to decrease pain, return to normal    Currently in Pain? Yes    Pain Score 1     Pain Orientation Right    Pain Descriptors / Indicators Aching    Pain Type Chronic pain    Pain Onset More than a month ago    Pain Frequency Intermittent    Aggravating Factors  driving at work                             [facility] Adult PT Treatment/Exercise - 12/05/19 0001      Shoulder Exercises: Seated   Row Strengthening;Both;15 reps;Theraband    Theraband Level (Shoulder Row) Level 3 (Green)    Flexion Strengthening;15 reps;Weights   seated on green physioball   Flexion Weight (lbs) 2    Other Seated Exercises scapular retraction with ER 2 x 15 with green  theraband   seated on green physioball     Shoulder Exercises: Standing   Protraction Limitations pushing into pink foam roller and rolling up/ down the wall 2 x 10    Other Standing Exercises lower trap activation wall y's 2 x 15      Shoulder Exercises: ROM/Strengthening   UBE (Upper Arm Bike) L4 x 6 min    fwd/bwd x 3 min     Manual Therapy   Manual therapy comments MTPR over the R upper trap/ levator scapulae and rhomboids   how to perform at home using theracane     Neck Exercises: Stretches   Upper Trapezius Stretch Right;2 reps;30 seconds   with overpressure   Levator Stretch Right;2 reps;30 seconds   with over pressure                 PT Education - 12/05/19 1139    Education Details discussed trialing using a rolled towel while driving to promote efficient sitting posture    Person(s) Educated Patient    Methods Explanation;Verbal cues;Handout    Comprehension Verbalized understanding;Verbal cues required  PT Short Term Goals - 10/17/19 1059      PT SHORT TERM GOAL #1   Title pt to be I with inital HEP    Period Weeks    Status Achieved      PT SHORT TERM GOAL #2   Title pt to verbalize and demonstrate efficient lifting and carrying mechanics to reduce and prevent neck and back pain    Period Weeks    Status Achieved      PT SHORT TERM GOAL #3   Title pt to report decreaesd muscls spasm in the  Rupper trap and surrounding musculature to promote cervical ROM    Period Weeks    Status Achieved             PT Long Term Goals - 11/19/19 0801      PT LONG TERM GOAL #1   Title increase cervical bil rotation and sidebending to [facility] with </= 2/10 pain for functional ROM required for safety with driving    Baseline R rotation 65 degrees and R sidebending 45 degrees, L rotation 60 degrees and L sidebending 40 degrees    Time 6    Period Weeks    Status On-going    Target Date 12/30/19      PT LONG TERM GOAL #2   Title increase R shoulder  ROM to [facility] with no report of pain in all planes for work related activities and ADL's    Baseline Patient exhibits slightly limited AROM but continued to have tightness at end rangfe    Time 6    Period Weeks    Status On-going    Target Date 12/30/19      PT LONG TERM GOAL #3   Title pt to increase R shoulder strength to >/= 4+/5 for functional lifting and carrying activities required for ADLs.    Baseline Right shoulder strength grossly 4/5 MMT    Time 6    Period Weeks    Status On-going    Target Date 12/30/19      PT LONG TERM GOAL #4   Title pt to be I with all HEP given to maintain and progress current level of function    Baseline Independent current HEP and progressing as able.    Time 6    Period Weeks    Status On-going    Target Date 12/30/19                 Plan - 12/05/19 1120    Clinical Impression Statement patient reports consistency with her HEP but does reporting 1/10 pain in the shoulder described as stiffness. continued self lead trigger point release and stretching. She did well with posterior shoulder strengthening reporting decreased stiffness. plan to review/ finalize HEP next session.    PT Treatment/Interventions ADLs/Self Care Home Management;Cryotherapy;Electrical Stimulation;Iontophoresis 4mg /ml Dexamethasone;Moist Heat;Traction;Ultrasound;Therapeutic activities;Therapeutic exercise;Balance training;Neuromuscular re-education;Patient/family education;Manual techniques;Passive range of motion;Dry needling;Vasopneumatic Device;Taping    PT Next Visit Plan review/ update HEP, review goals, D/C    PT Home Exercise Plan [id number] - upper trap / levator scapulae stretching, supine chin tuck, wall walks (flexion/ abduction), shoulder IR/ER, rows, high rows, wall push-up, horiz abduction    Consulted and Agree with Plan of Care Patient           Patient will benefit from skilled therapeutic intervention in order to improve the following deficits and  impairments:  Improper body mechanics, Increased muscle spasms, Decreased strength, Pain, Postural dysfunction, Decreased endurance, Decreased range of  motion, Decreased activity tolerance  Visit Diagnosis: Cervicalgia  Other muscle spasm  Abnormal posture  Chronic right shoulder pain     Problem List Patient Active Problem List   Diagnosis Date Noted  . Active preterm labor 03/30/2015  . Gestational diabetes mellitus (GDM) affecting pregnancy, antepartum 03/07/2015  . Placental abruption in third trimester 02/14/2015  . Supervision of high risk pregnancy, antepartum 12/24/2014  . Obesity (BMI 30-39.9) 12/24/2014  . Ovarian cyst in pregnancy 12/24/2014  . Complicated migraine 82/95/6213    Starr Lake PT, DPT, LAT, ATC  12/05/19  11:43 AM      [facility] [address] [place], [place], [address] Phone: [phone]   Fax:  [phone]  Name: Veronica Obrien MRN: [id number] Date of Birth: 1980/11/16

## 2019-12-09 ENCOUNTER — Encounter: Payer: Medicaid Other | Admitting: Family Medicine

## 2019-12-09 ENCOUNTER — Encounter: Payer: Self-pay | Admitting: Family Medicine

## 2019-12-09 ENCOUNTER — Other Ambulatory Visit: Payer: Self-pay

## 2019-12-09 ENCOUNTER — Ambulatory Visit (INDEPENDENT_AMBULATORY_CARE_PROVIDER_SITE_OTHER): Payer: Medicaid Other | Admitting: Family Medicine

## 2019-12-09 VITALS — BP 121/80 | HR 92 | Wt 231.0 lb

## 2019-12-09 DIAGNOSIS — R102 Pelvic and perineal pain: Secondary | ICD-10-CM | POA: Insufficient documentation

## 2019-12-09 NOTE — Patient Instructions (Signed)
Pelvic Pain, Female Pelvic pain is pain in your lower abdomen, below your belly button and between your hips. The pain may start suddenly (be acute), keep coming back (be recurring), or last a long time (become chronic). Pelvic pain that lasts longer than 6 months is considered chronic. Pelvic pain may affect your:  Reproductive organs.  Urinary system.  Digestive tract.  Musculoskeletal system. There are many potential causes of pelvic pain. Sometimes, the pain can be a result of digestive or urinary conditions, strained muscles or ligaments, or reproductive conditions. Sometimes the cause of pelvic pain is not known. Follow these instructions at home:   Take over-the-counter and prescription medicines only as told by your health care provider.  Rest as told by your health care provider.  Do not have sex if it hurts.  Keep a journal of your pelvic pain. Write down: ? When the pain started. ? Where the pain is located. ? What seems to make the pain better or worse, such as food or your period (menstrual cycle). ? Any symptoms you have along with the pain.  Keep all follow-up visits as told by your health care provider. This is important. Contact a health care provider if:  Medicine does not help your pain.  Your pain comes back.  You have new symptoms.  You have abnormal vaginal discharge or bleeding, including bleeding after menopause.  You have a fever or chills.  You are constipated.  You have blood in your urine or stool.  You have foul-smelling urine.  You feel weak or light-headed. Get help right away if:  You have sudden severe pain.  Your pain gets steadily worse.  You have severe pain along with fever, nausea, vomiting, or excessive sweating.  You lose consciousness. Summary  Pelvic pain is pain in your lower abdomen, below your belly button and between your hips.  There are many potential causes of pelvic pain.  Keep a journal of your pelvic  pain. This information is not intended to replace advice given to you by your health care provider. Make sure you discuss any questions you have with your health care provider. Document Revised: 09/05/2017 Document Reviewed: 09/05/2017 Elsevier Patient Education  2020 Elsevier Inc.  

## 2019-12-09 NOTE — Assessment & Plan Note (Signed)
Tender on left--will check pelvic sonogram.

## 2019-12-09 NOTE — Progress Notes (Signed)
   Subjective:    Patient ID: Veronica Obrien is a 39 y.o. female presenting with Vaginal Pain  on 12/09/2019  HPI: Sharp pain during intercourse x last 2 years.  Polyp removed and has h/o ovarian cyst. Had mirena, removed due to poking her, ? If her cervix is damaged. Notes pain is central and internal, but not at level of ovaries. Not on any birth control. No intercourse for some time. Does not want any more babies.  Has some dysmenorrhea at times.   Review of Systems  Constitutional: Negative for chills and fever.  Respiratory: Negative for shortness of breath.   Cardiovascular: Negative for chest pain.  Gastrointestinal: Negative for abdominal pain, nausea and vomiting.  Genitourinary: Negative for dysuria.  Skin: Negative for rash.      Objective:    BP 121/80   Pulse 92   Wt 231 lb (104.8 kg)   LMP 12/07/2019 (Approximate)   BMI 35.12 kg/m  Physical Exam Constitutional:      General: She is not in acute distress.    Appearance: She is well-developed.  HENT:     Head: Normocephalic and atraumatic.  Eyes:     General: No scleral icterus. Cardiovascular:     Rate and Rhythm: Normal rate.  Pulmonary:     Effort: Pulmonary effort is normal.  Abdominal:     Palpations: Abdomen is soft.  Genitourinary:    Comments: BUS normal, vagina is pink and rugated, cervix is parous without lesion, no CMT, uterus is small and anteverted, right adnexal tenderness, no left adnexal mass or tenderness.  Musculoskeletal:     Cervical back: Neck supple.  Skin:    General: Skin is warm and dry.  Neurological:     Mental Status: She is alert and oriented to person, place, and time.         Assessment & Plan:   Problem List Items Addressed This Visit      Unprioritized   Pelvic pain - Primary    Tender on left--will check pelvic sonogram.      Relevant Orders   US PELVIC COMPLETE WITH TRANSVAGINAL      Total time in review of prior notes, pathology, labs, history taking,  review with patient, exam, note writing, discussion of options, plan for next steps, alternatives and risks of treatment: 37 minutes.  Return in about 6 weeks (around 01/20/2020).  Veronica Obrien 12/09/2019 3:48 PM

## 2019-12-12 ENCOUNTER — Ambulatory Visit: Payer: Medicaid Other | Admitting: Physical Therapy

## 2019-12-12 ENCOUNTER — Other Ambulatory Visit: Payer: Self-pay

## 2019-12-12 ENCOUNTER — Encounter: Payer: Self-pay | Admitting: Physical Therapy

## 2019-12-12 DIAGNOSIS — M542 Cervicalgia: Secondary | ICD-10-CM

## 2019-12-12 DIAGNOSIS — R293 Abnormal posture: Secondary | ICD-10-CM

## 2019-12-12 DIAGNOSIS — G8929 Other chronic pain: Secondary | ICD-10-CM

## 2019-12-12 DIAGNOSIS — M62838 Other muscle spasm: Secondary | ICD-10-CM

## 2019-12-12 NOTE — Therapy (Signed)
[place], [place], [address] Phone: [phone]   Fax:  [phone]  Physical Therapy Treatment / Discharge  Patient Details  Name: Veronica Obrien MRN: [id number] Date of Birth: 06-02-1980 Referring Provider (PT): [name] [place]   Encounter Date: 12/12/2019   PT End of Session - 12/12/19 1141    Visit Number 10    Number of Visits 14    Date for PT Re-Evaluation 12/30/19    Authorization Type UHC [place] - no pre-auth required    Authorization - Visit Number 9    Authorization - Number of Visits 27    PT Start Time 4132    PT Stop Time 1215    PT Time Calculation (min) 40 min    Activity Tolerance Patient tolerated treatment well    Behavior During Therapy [facility] for tasks assessed/performed           Past Medical History:  Diagnosis Date  . Medical history non-contributory     Past Surgical History:  Procedure Laterality Date  . WISDOM TOOTH EXTRACTION      There were no vitals filed for this visit.   Subjective Assessment - 12/12/19 1139    Subjective Patient reports she is doing well. She has been feeling her arm and neck with her driving, also left side of neck is starting to bother he some. She states she does not have a lot of pain but has instances where it will bother her.    How long can you sit comfortably? unlimited    How long can you stand comfortably? unlimited    How long can you walk comfortably? unlimited    Patient Stated Goals to decrease pain, return to normal    Currently in Pain? Yes    Pain Score 0-No pain    Pain Location Neck    Pain Orientation Right    Pain Type Chronic pain    Pain Onset More than a month ago    Pain Frequency Intermittent              [facility] PT Assessment - 12/12/19 0001      AROM   Overall AROM Comments Cervical and shoulder AROM grossly WFL and equal bilaterally, mild tightness right upper trap with  left side bend but no reported pain      Strength   Overall Strength Comments Right shoulder and periscapular strength grossly >/= 4+/5                         [facility] Adult PT Treatment/Exercise - 12/12/19 0001      Self-Care   Posture Using lumbar roll while driving for posture    Other Self-Care Comments  Exercise consistency and frequency, follow-up instructions      Exercises   Exercises Neck;Shoulder      Shoulder Exercises: Standing   Protraction Limitations Wall push-up x15    Horizontal ABduction 15 reps    Theraband Level (Shoulder Horizontal ABduction) Level 3 (Green)    External Rotation 15 reps    Theraband Level (Shoulder External Rotation) Level 3 (Green)    Internal Rotation 15 reps    Theraband Level (Shoulder Internal Rotation) Level 3 (Green)    Flexion 15 reps    Shoulder Flexion Weight (lbs) 3    Flexion Limitations scaption    Extension 15 reps    Theraband Level (Shoulder Extension)  Level 3 (Green)    Row 15 reps    Theraband Level (Shoulder Row) Level 4 (Blue)    Other Standing Exercises High row at 90 deg abd with green x15      Shoulder Exercises: ROM/Strengthening   UBE (Upper Arm Bike) L5 x 6 min (fwd/bwd)      Neck Exercises: Stretches   Upper Trapezius Stretch 2 reps;30 seconds    Levator Stretch 2 reps;30 seconds    Other Neck Stretches TRP using theracane with movement    Other Neck Stretches Table top step back stretch for thoracic extension, sidelying thoracic rotation stretch                   PT Education - 12/12/19 1141    Education Details HEP    Person(s) Educated Patient    Methods Explanation    Comprehension Verbalized understanding;Need further instruction            PT Short Term Goals - 10/17/19 1059      PT SHORT TERM GOAL #1   Title pt to be I with inital HEP    Period Weeks    Status Achieved      PT SHORT TERM GOAL #2   Title pt to verbalize and demonstrate efficient lifting and carrying  mechanics to reduce and prevent neck and back pain    Period Weeks    Status Achieved      PT SHORT TERM GOAL #3   Title pt to report decreaesd muscls spasm in the  Rupper trap and surrounding musculature to promote cervical ROM    Period Weeks    Status Achieved             PT Long Term Goals - 12/12/19 1144      PT LONG TERM GOAL #1   Title increase cervical bil rotation and sidebending to [facility] with </= 2/10 pain for functional ROM required for safety with driving    Baseline Patient exhibits cervical motion WFL    Time 6    Period Weeks    Status Achieved      PT LONG TERM GOAL #2   Title increase R shoulder ROM to [facility] with no report of pain in all planes for work related activities and ADL's    Baseline Patient exhibits right shoulder motion WFL    Time 6    Period Weeks    Status Achieved      PT LONG TERM GOAL #3   Title pt to increase R shoulder strength to >/= 4+/5 for functional lifting and carrying activities required for ADLs.    Baseline Right shoulder and periscapular strength grossly >/= 4+/5    Time 6    Period Weeks    Status Achieved      PT LONG TERM GOAL #4   Title pt to be I with all HEP given to maintain and progress current level of function    Baseline Independent current HEP    Time 6    Period Weeks    Status Achieved                 Plan - 12/12/19 1142    Clinical Impression Statement Patient tolerated therapy well with no adverse effects. She demonstrated independence with HEP this visit and reports she exhibits improved cervical and shoulder motion and improve strength. She does continue to report tightness with work while sitting extended periods, but she has the tools to continue  improving her symptoms. She will be formally discharged from PT to independent HEP.    PT Treatment/Interventions ADLs/Self Care Home Management;Cryotherapy;Electrical Stimulation;Iontophoresis 44m/ml Dexamethasone;Moist Heat;Traction;Ultrasound;Therapeutic  activities;Therapeutic exercise;Balance training;Neuromuscular re-education;Patient/family education;Manual techniques;Passive range of motion;Dry needling;Vasopneumatic Device;Taping    PT Next Visit Plan NA - discharge    PT Home Exercise Plan [id number]    Consulted and Agree with Plan of Care Patient           Patient will benefit from skilled therapeutic intervention in order to improve the following deficits and impairments:  Improper body mechanics, Increased muscle spasms, Decreased strength, Pain, Postural dysfunction, Decreased endurance, Decreased range of motion, Decreased activity tolerance  Visit Diagnosis: Cervicalgia  Other muscle spasm  Abnormal posture  Chronic right shoulder pain     Problem List Patient Active Problem List   Diagnosis Date Noted  . Pelvic pain 12/09/2019  . History of gestational diabetes 03/07/2015  . Obesity (BMI 30-39.9) 12/24/2014  . Ovarian cyst in pregnancy 12/24/2014  . Complicated migraine 089/16/9450   PHYSICAL THERAPY DISCHARGE SUMMARY  Visits from Start of Care: 10  Current functional level related to goals / functional outcomes: See above   Remaining deficits: See above   Education / Equipment: HEP  Plan: Patient agrees to discharge.  Patient goals were met. Patient is being discharged due to meeting the stated rehab goals.  ?????     CHilda Blades PT, DPT, LAT, ATC 12/12/19  1:16 PM Phone: [phone]: 3[facility]GOakland [address] [address]Phone: [phone]  Fax:  [phone] Name: KANOLA MCGOUGHMRN: [id number] of Birth: 110-13-1982

## 2019-12-12 NOTE — Patient Instructions (Signed)
Access Code: [email or web address] URL: [email or web address] Date: 12/12/2019 Prepared by: Hilda Blades  Exercises Seated Upper Trapezius Stretch - 2 x daily - 7 x weekly - 2 sets - 2 reps - 30 seconds hold Gentle Levator Scapulae Stretch - 1 x daily - 7 x weekly - 2 sets - 2 reps - 30 seconds hold Supine Chin Tuck with Towel - 1 x daily - 7 x weekly - 10 reps - 2 sets - 5 hold Seated Scapular Retraction - 1 x daily - 7 x weekly - 10 reps - 2 sets - 5 seconds hold Standing Shoulder Row with Anchored Resistance - 1 x daily - 7 x weekly - 2 sets - 20 reps Shoulder External Rotation - 1 x daily - 7 x weekly - 2 sets - 20 reps Shoulder Internal Rotation - 1 x daily - 7 x weekly - 2 sets - 20 reps Standing Row with Resistance with Anchored Resistance at Chest Height Palms Down - 1 x daily - 7 x weekly - 2 sets - 15 reps Standing Shoulder Horizontal Abduction with Resistance - 1 x daily - 7 x weekly - 2 sets - 15 reps Scaption with Dumbbells - 1 x daily - 7 x weekly - 2 sets - 15 reps Wall Push Up - 1 x daily - 7 x weekly - 2 sets - 10 reps Sidelying Thoracic Lumbar Rotation - 1 x daily - 7 x weekly - 10 reps - 5 seconds hold Step Back Shoulder Stretch with Chair - 1 x daily - 7 x weekly - 10 reps - 5 seconds hold Theracane Over Shoulder

## 2019-12-18 ENCOUNTER — Ambulatory Visit
Admission: RE | Admit: 2019-12-18 | Discharge: 2019-12-18 | Disposition: A | Discharge status: Home / Self Care | Payer: Medicaid Other | Source: Ambulatory Visit | Attending: Family Medicine | Admitting: Family Medicine

## 2019-12-18 ENCOUNTER — Other Ambulatory Visit: Payer: Self-pay

## 2019-12-18 DIAGNOSIS — R102 Pelvic and perineal pain: Secondary | ICD-10-CM

## 2019-12-29 ENCOUNTER — Ambulatory Visit
Admission: RE | Admit: 2019-12-29 | Discharge: 2019-12-29 | Disposition: A | Discharge status: Home / Self Care | Payer: Medicaid Other | Source: Ambulatory Visit | Attending: Family Medicine | Admitting: Family Medicine

## 2019-12-29 ENCOUNTER — Other Ambulatory Visit: Payer: Self-pay

## 2019-12-29 DIAGNOSIS — R102 Pelvic and perineal pain: Secondary | ICD-10-CM | POA: Insufficient documentation

## 2020-01-01 ENCOUNTER — Telehealth: Payer: Self-pay

## 2020-01-01 ENCOUNTER — Other Ambulatory Visit: Payer: Self-pay

## 2020-01-01 DIAGNOSIS — R102 Pelvic and perineal pain: Secondary | ICD-10-CM

## 2020-01-01 NOTE — Telephone Encounter (Signed)
-----   Message from Veronica Jude, MD sent at 01/01/2020  9:33 AM EDT ----- Has ovarian cyst, let's repeat the u/s in 6 wks, and have her come by for CA 125. Hopefully it will resolve by then. See how her pain is doing also

## 2020-01-01 NOTE — Telephone Encounter (Signed)
Called pt, informed of results. Pt scheduled for labs. Pt voiced understanding and denies any questions or concerns.

## 2020-01-06 ENCOUNTER — Other Ambulatory Visit: Payer: Medicaid Other

## 2020-01-14 ENCOUNTER — Other Ambulatory Visit: Payer: Medicaid Other

## 2020-01-15 ENCOUNTER — Other Ambulatory Visit: Payer: Medicaid Other

## 2020-01-15 ENCOUNTER — Other Ambulatory Visit: Payer: Self-pay

## 2020-01-15 DIAGNOSIS — R102 Pelvic and perineal pain: Secondary | ICD-10-CM

## 2020-01-16 LAB — CA 125: Cancer Antigen (CA) 125: 6.7 U/mL (ref 0.0–38.1)

## 2020-01-20 ENCOUNTER — Encounter: Payer: Self-pay | Admitting: Family Medicine

## 2020-01-20 ENCOUNTER — Ambulatory Visit: Payer: Medicaid Other | Admitting: Family Medicine

## 2020-01-20 NOTE — Progress Notes (Signed)
Patient did not keep appointment today. She may call to reschedule.  

## 2020-02-05 ENCOUNTER — Other Ambulatory Visit: Payer: Self-pay

## 2020-02-05 ENCOUNTER — Ambulatory Visit (INDEPENDENT_AMBULATORY_CARE_PROVIDER_SITE_OTHER): Payer: Medicaid Other | Admitting: Family Medicine

## 2020-02-05 ENCOUNTER — Encounter: Payer: Self-pay | Admitting: Family Medicine

## 2020-02-05 DIAGNOSIS — N83201 Unspecified ovarian cyst, right side: Secondary | ICD-10-CM

## 2020-02-05 NOTE — Progress Notes (Signed)
   Subjective:    Patient ID: Veronica Obrien is a 39 y.o. female presenting with Follow-up  on 02/05/2020  HPI: Here for f/u. Initially seen for ? Cyst. S/p u/s which revealed 7.7 x 7.3 x 5.2 cm complex cyst with probable hydrosalpinx. Pain has gotten acutely worse, then got better. CA 125 is normal.  Review of Systems  Constitutional: Negative for chills and fever.  Respiratory: Negative for shortness of breath.   Cardiovascular: Negative for chest pain.  Gastrointestinal: Negative for abdominal pain, nausea and vomiting.  Genitourinary: Negative for dysuria.  Skin: Negative for rash.      Objective:    BP 130/83   Pulse 62   Ht 5\' 8"  (1.727 m)   Wt 234 lb 9.6 oz (106.4 kg)   BMI 35.67 kg/m  Physical Exam Constitutional:      General: She is not in acute distress.    Appearance: She is well-developed.  HENT:     Head: Normocephalic and atraumatic.  Eyes:     General: No scleral icterus. Cardiovascular:     Rate and Rhythm: Normal rate.  Pulmonary:     Effort: Pulmonary effort is normal.  Abdominal:     Palpations: Abdomen is soft.  Musculoskeletal:     Cervical back: Neck supple.  Skin:    General: Skin is warm and dry.  Neurological:     Mental Status: She is alert and oriented to person, place, and time.    Complex cystic structure within the right ovary measuring up to 7.7 cm with internal septations. Recommend follow-up ultrasound in 2-3 months to assess for change. Possible adjacent hydrosalpinx.    Assessment & Plan:   Problem List Items Addressed This Visit      Unprioritized   Right ovarian cyst    Complex in nature and likely there for a long time. Had 5 cm cyst on right in 2016. Now 7 cm, with normal CA 125--f/u in 2-3 months. Will likely need surgical eval.         Total time in review of prior notes, pathology, labs, history taking, review with patient, exam, note writing, discussion of options, plan for next steps, alternatives and risks of  treatment: 22 minutes.  Return in about 4 weeks (around 03/04/2020) for in person, a follow-up.  Donnamae Jude 02/06/2020 4:25 PM

## 2020-02-06 NOTE — Assessment & Plan Note (Signed)
Complex in nature and likely there for a long time. Had 5 cm cyst on right in 2016. Now 7 cm, with normal CA 125--f/u in 2-3 months. Will likely need surgical eval.

## 2020-02-19 ENCOUNTER — Ambulatory Visit
Admission: RE | Admit: 2020-02-19 | Discharge: 2020-02-19 | Disposition: A | Discharge status: Home / Self Care | Payer: Medicaid Other | Source: Ambulatory Visit | Attending: Family Medicine | Admitting: Family Medicine

## 2020-02-19 ENCOUNTER — Other Ambulatory Visit: Payer: Self-pay

## 2020-02-19 DIAGNOSIS — R102 Pelvic and perineal pain: Secondary | ICD-10-CM | POA: Insufficient documentation

## 2020-03-04 ENCOUNTER — Ambulatory Visit (INDEPENDENT_AMBULATORY_CARE_PROVIDER_SITE_OTHER): Payer: Medicaid Other | Admitting: Family Medicine

## 2020-03-04 ENCOUNTER — Other Ambulatory Visit: Payer: Self-pay

## 2020-03-04 ENCOUNTER — Encounter: Payer: Self-pay | Admitting: Family Medicine

## 2020-03-04 DIAGNOSIS — N83201 Unspecified ovarian cyst, right side: Secondary | ICD-10-CM

## 2020-03-04 NOTE — Assessment & Plan Note (Signed)
For surgical removal. Attempt at laparoscopy--have GYN/ONC look at images, but given how long it has been there and her normal CA 125 suspect will be ok for generalist. Risks include but are not limited to bleeding, infection, injury to surrounding structures, including bowel, bladder and ureters, blood clots, and death.  Likelihood of success is high.

## 2020-03-04 NOTE — Progress Notes (Signed)
    Subjective:    Patient ID: Veronica Obrien is a 39 y.o. female presenting with Follow-up  on 03/04/2020  HPI: Here for f/u. Has long h/o ovarian mass. At least present since 04/2010 which might be endometrioma, but which has changed over time and she is now having pain. She is having pain that is right sided. Her most recent u/s shows that her ovarian cyst has gotten bigger and more complex. She has a normal CA 125.  Review of Systems  Constitutional: Negative for chills and fever.  Respiratory: Negative for shortness of breath.   Cardiovascular: Negative for chest pain.  Gastrointestinal: Negative for abdominal pain, nausea and vomiting.  Genitourinary: Negative for dysuria.  Skin: Negative for rash.      Objective:    BP 115/77   Pulse 62   Wt 227 lb (103 kg)   BMI 34.52 kg/m  Physical Exam Constitutional:      General: She is not in acute distress.    Appearance: She is well-developed.  HENT:     Head: Normocephalic and atraumatic.  Eyes:     General: No scleral icterus. Cardiovascular:     Rate and Rhythm: Normal rate.  Pulmonary:     Effort: Pulmonary effort is normal.  Abdominal:     Palpations: Abdomen is soft.  Musculoskeletal:     Cervical back: Neck supple.  Skin:    General: Skin is warm and dry.  Neurological:     Mental Status: She is alert and oriented to person, place, and time.         Assessment & Plan:   Problem List Items Addressed This Visit      Unprioritized   Right ovarian cyst    For surgical removal. Attempt at laparoscopy--have GYN/ONC look at images, but given how long it has been there and her normal CA 125 suspect will be ok for generalist. Risks include but are not limited to bleeding, infection, injury to surrounding structures, including bowel, bladder and ureters, blood clots, and death.  Likelihood of success is high.          Total time in review of prior notes, pathology, labs, history taking, review with patient,  exam, note writing, discussion of options, plan for next steps, alternatives and risks of treatment: 39 minutes.  Return in about 3 months (around 06/02/2020) for postop check.  Veronica Obrien 03/04/2020 7:18 PM

## 2020-03-04 NOTE — Progress Notes (Signed)
Still having pain and bleeding off and on as well

## 2020-04-22 DIAGNOSIS — Z302 Encounter for sterilization: Secondary | ICD-10-CM

## 2020-04-22 NOTE — Progress Notes (Signed)
Spoke with patient and she is rescheduling surgery due to positive covid test 04-17-2020 and has lingering cough and congestion

## 2020-04-22 NOTE — H&P (Signed)
Veronica Obrien is an 40 y.o. G67P1102 female.   Chief Complaint: right ovarian cyst HPI: patient with long h/o ovarian cyst which has gotten slightly larger. Thought to possibly be an endometrioma. Has normal CA 125-but increasing pain, for removal.  Past Medical History:  Diagnosis Date  . Medical history non-contributory     Past Surgical History:  Procedure Laterality Date  . WISDOM TOOTH EXTRACTION      Family History  Problem Relation Age of Onset  . Cancer Sister        lymphoma non-Hodgkins  . Arthritis Father   . Heart disease Maternal Grandfather   . Diabetes Maternal Grandfather   . Diabetes Paternal Grandmother   . Breast cancer Paternal Grandmother        20  . Diabetes Paternal Grandfather    Social History:  reports that she quit smoking about 5 years ago. Her smoking use included cigarettes. She smoked 0.00 packs per day. She has never used smokeless tobacco. She reports that she does not drink alcohol and does not use drugs.  Allergies:  Allergies  Allergen Reactions  . Escitalopram Oxalate     Other reaction(s): bad dreams  . Novocain [Procaine Hcl] Other (See Comments)    Pt states that it makes her try to bite her tongue.    . Venlafaxine Hcl Er     Other reaction(s): anger worse    No medications prior to admission.    Pertinent items are noted in HPI.  currently breastfeeding. There were no vitals taken for this visit. General appearance: alert, cooperative and appears stated age Head: Normocephalic, without obvious abnormality, atraumatic Neck: supple, symmetrical, trachea midline Lungs: normal effort Heart: regular rate and rhythm Abdomen: soft, non-tender; bowel sounds normal; no masses,  no organomegaly Extremities: extremities normal, atraumatic, no cyanosis or edema Skin: Skin color, texture, turgor normal. No rashes or lesions Neurologic: Grossly normal   Lab Results  Component Value Date   WBC 9.9 10/28/2015   HGB 14.4 10/28/2015    HCT 42.0 10/28/2015   MCV 85.4 10/28/2015   PLT 332 10/28/2015   Lab Results  Component Value Date   PREGTESTUR NEGATIVE 09/27/2010   HCG <5.0 10/28/2015     Assessment/Plan Principal Problem:   Right ovarian cyst Active Problems:   Encounter for sterilization  For laparoscopic oophorectomy with bilateral salpingectomy. Risks include but are not limited to bleeding, infection, injury to surrounding structures, including bowel, bladder and ureters, blood clots, and death.  Likelihood of success is high.    Donnamae Jude 04/22/2020, 10:00 AM

## 2020-04-22 NOTE — Progress Notes (Signed)
patient called to tell us that she tested positive for covid this week. from [facility] patient knows that she she needs a copy of the result and it working to obtain that result.  Patient will not need to be retested for COVID for 90 days from her positive result

## 2020-04-23 ENCOUNTER — Inpatient Hospital Stay (HOSPITAL_COMMUNITY): Admission: RE | Admit: 2020-04-23 | Payer: Medicaid Other | Source: Ambulatory Visit

## 2020-04-23 NOTE — Progress Notes (Signed)
Epic inbasket note sent to [facility] and dr pratt patient needs to be rescheduled a little farther out for surgery she has lingering cough and congestion and she stated she would call office to reschedule, positive covid test was 04-17-2020

## 2020-04-27 DIAGNOSIS — Z302 Encounter for sterilization: Secondary | ICD-10-CM

## 2020-04-27 DIAGNOSIS — N83201 Unspecified ovarian cyst, right side: Secondary | ICD-10-CM | POA: Diagnosis present

## 2020-04-28 ENCOUNTER — Other Ambulatory Visit: Payer: Self-pay

## 2020-04-28 ENCOUNTER — Encounter (HOSPITAL_BASED_OUTPATIENT_CLINIC_OR_DEPARTMENT_OTHER): Payer: Self-pay | Admitting: Family Medicine

## 2020-05-04 ENCOUNTER — Encounter (HOSPITAL_BASED_OUTPATIENT_CLINIC_OR_DEPARTMENT_OTHER)
Admission: RE | Admit: 2020-05-04 | Discharge: 2020-05-04 | Disposition: A | Discharge status: Home / Self Care | Payer: BC Managed Care – PPO | Source: Ambulatory Visit | Attending: Family Medicine | Admitting: Family Medicine

## 2020-05-04 DIAGNOSIS — Z01812 Encounter for preprocedural laboratory examination: Secondary | ICD-10-CM | POA: Insufficient documentation

## 2020-05-04 LAB — CBC
HCT: 40.5 % (ref 36.0–46.0)
Hemoglobin: 13 g/dL (ref 12.0–15.0)
MCH: 27.8 pg (ref 26.0–34.0)
MCHC: 32.1 g/dL (ref 30.0–36.0)
MCV: 86.5 fL (ref 80.0–100.0)
Platelets: 344 10*3/uL (ref 150–400)
RBC: 4.68 MIL/uL (ref 3.87–5.11)
RDW: 13.2 % (ref 11.5–15.5)
WBC: 9.8 10*3/uL (ref 4.0–10.5)
nRBC: 0 % (ref 0.0–0.2)

## 2020-05-04 LAB — POCT PREGNANCY, URINE: Preg Test, Ur: NEGATIVE

## 2020-05-04 NOTE — Progress Notes (Signed)
Spoke with patient, states she is coming to [facility] for labs, and will bring her paperwork that states she was Covid-19 positive 04/17/20.

## 2020-05-05 ENCOUNTER — Encounter (HOSPITAL_BASED_OUTPATIENT_CLINIC_OR_DEPARTMENT_OTHER)
Admission: RE | Disposition: A | Discharge status: Home / Self Care | Payer: Self-pay | Source: Home / Self Care | Attending: Family Medicine

## 2020-05-05 ENCOUNTER — Encounter (HOSPITAL_BASED_OUTPATIENT_CLINIC_OR_DEPARTMENT_OTHER): Payer: Self-pay | Admitting: Family Medicine

## 2020-05-05 ENCOUNTER — Ambulatory Visit (HOSPITAL_BASED_OUTPATIENT_CLINIC_OR_DEPARTMENT_OTHER)
Admission: RE | Admit: 2020-05-05 | Discharge: 2020-05-05 | Disposition: A | Discharge status: Home / Self Care | Payer: BC Managed Care – PPO | Attending: Family Medicine | Admitting: Family Medicine

## 2020-05-05 ENCOUNTER — Ambulatory Visit (HOSPITAL_BASED_OUTPATIENT_CLINIC_OR_DEPARTMENT_OTHER): Payer: BC Managed Care – PPO | Admitting: Anesthesiology

## 2020-05-05 ENCOUNTER — Other Ambulatory Visit: Payer: Self-pay

## 2020-05-05 DIAGNOSIS — N83201 Unspecified ovarian cyst, right side: Secondary | ICD-10-CM | POA: Diagnosis present

## 2020-05-05 DIAGNOSIS — Z833 Family history of diabetes mellitus: Secondary | ICD-10-CM | POA: Insufficient documentation

## 2020-05-05 DIAGNOSIS — Z884 Allergy status to anesthetic agent status: Secondary | ICD-10-CM | POA: Insufficient documentation

## 2020-05-05 DIAGNOSIS — Z8249 Family history of ischemic heart disease and other diseases of the circulatory system: Secondary | ICD-10-CM | POA: Diagnosis not present

## 2020-05-05 DIAGNOSIS — Z803 Family history of malignant neoplasm of breast: Secondary | ICD-10-CM | POA: Diagnosis not present

## 2020-05-05 DIAGNOSIS — Z302 Encounter for sterilization: Secondary | ICD-10-CM

## 2020-05-05 DIAGNOSIS — Z87891 Personal history of nicotine dependence: Secondary | ICD-10-CM | POA: Diagnosis not present

## 2020-05-05 DIAGNOSIS — Z807 Family history of other malignant neoplasms of lymphoid, hematopoietic and related tissues: Secondary | ICD-10-CM | POA: Insufficient documentation

## 2020-05-05 DIAGNOSIS — N8312 Corpus luteum cyst of left ovary: Secondary | ICD-10-CM

## 2020-05-05 DIAGNOSIS — Z888 Allergy status to other drugs, medicaments and biological substances status: Secondary | ICD-10-CM | POA: Insufficient documentation

## 2020-05-05 DIAGNOSIS — D270 Benign neoplasm of right ovary: Secondary | ICD-10-CM | POA: Insufficient documentation

## 2020-05-05 DIAGNOSIS — Z8261 Family history of arthritis: Secondary | ICD-10-CM | POA: Diagnosis not present

## 2020-05-05 HISTORY — DX: Anxiety disorder, unspecified: F41.9

## 2020-05-05 HISTORY — DX: Depression, unspecified: F32.A

## 2020-05-05 HISTORY — PX: LAPAROSCOPIC SALPINGO OOPHERECTOMY: SHX5927

## 2020-05-05 SURGERY — SALPINGO-OOPHORECTOMY, LAPAROSCOPIC
Anesthesia: General | Site: Abdomen

## 2020-05-05 MED ORDER — BUPIVACAINE HCL (PF) 0.25 % IJ SOLN
INTRAMUSCULAR | Status: DC | PRN
  Administered 2020-05-05: 16 mL

## 2020-05-05 MED ORDER — ACETAMINOPHEN 500 MG PO TABS
1000.0000 mg | ORAL_TABLET | ORAL | Status: AC
  Administered 2020-05-05: 1000 mg via ORAL

## 2020-05-05 MED ORDER — ACETAMINOPHEN 500 MG PO TABS: 1000.0000 mg | ORAL_TABLET | Freq: Once | ORAL | Status: DC

## 2020-05-05 MED ORDER — SUGAMMADEX SODIUM 200 MG/2ML IV SOLN
INTRAVENOUS | Status: DC | PRN
  Administered 2020-05-05: 250 mg via INTRAVENOUS

## 2020-05-05 MED ORDER — OXYCODONE HCL 5 MG/5ML PO SOLN: 5.0000 mg | Freq: Once | ORAL | Status: AC | PRN

## 2020-05-05 MED ORDER — LIDOCAINE 2% (20 MG/ML) 5 ML SYRINGE
INTRAMUSCULAR | Status: AC
  Filled 2020-05-05: qty 5

## 2020-05-05 MED ORDER — PROPOFOL 10 MG/ML IV BOLUS
INTRAVENOUS | Status: AC
  Filled 2020-05-05: qty 20

## 2020-05-05 MED ORDER — ROCURONIUM BROMIDE 10 MG/ML (PF) SYRINGE
PREFILLED_SYRINGE | INTRAVENOUS | Status: AC
  Filled 2020-05-05: qty 10

## 2020-05-05 MED ORDER — OXYCODONE-ACETAMINOPHEN 5-325 MG PO TABS
ORAL_TABLET | ORAL | Status: AC
  Filled 2020-05-05: qty 1

## 2020-05-05 MED ORDER — FENTANYL CITRATE (PF) 100 MCG/2ML IJ SOLN
INTRAMUSCULAR | Status: DC | PRN
  Administered 2020-05-05 (×2): 50 ug via INTRAVENOUS
  Administered 2020-05-05: 100 ug via INTRAVENOUS

## 2020-05-05 MED ORDER — CELECOXIB 200 MG PO CAPS
400.0000 mg | ORAL_CAPSULE | ORAL | Status: AC
  Administered 2020-05-05: 400 mg via ORAL

## 2020-05-05 MED ORDER — CELECOXIB 200 MG PO CAPS
ORAL_CAPSULE | ORAL | Status: AC
  Filled 2020-05-05: qty 2

## 2020-05-05 MED ORDER — POVIDONE-IODINE 10 % EX SWAB: 2.0000 "application " | Freq: Once | CUTANEOUS | Status: DC

## 2020-05-05 MED ORDER — ONDANSETRON HCL 4 MG/2ML IJ SOLN
INTRAMUSCULAR | Status: AC
  Filled 2020-05-05: qty 2

## 2020-05-05 MED ORDER — LIDOCAINE 2% (20 MG/ML) 5 ML SYRINGE
INTRAMUSCULAR | Status: DC | PRN
  Administered 2020-05-05: 100 mg via INTRAVENOUS

## 2020-05-05 MED ORDER — OXYCODONE HCL 5 MG PO TABS
ORAL_TABLET | ORAL | Status: AC
  Filled 2020-05-05: qty 1

## 2020-05-05 MED ORDER — ROCURONIUM BROMIDE 10 MG/ML (PF) SYRINGE
PREFILLED_SYRINGE | INTRAVENOUS | Status: DC | PRN
  Administered 2020-05-05: 60 mg via INTRAVENOUS

## 2020-05-05 MED ORDER — ACETAMINOPHEN 500 MG PO TABS
ORAL_TABLET | ORAL | Status: AC
  Filled 2020-05-05: qty 2

## 2020-05-05 MED ORDER — PROMETHAZINE HCL 25 MG/ML IJ SOLN: 6.2500 mg | INTRAMUSCULAR | Status: DC | PRN

## 2020-05-05 MED ORDER — LACTATED RINGERS IV SOLN: INTRAVENOUS | Status: DC

## 2020-05-05 MED ORDER — SODIUM CHLORIDE 0.9 % IR SOLN
Status: DC | PRN
  Administered 2020-05-05: 1000 mL

## 2020-05-05 MED ORDER — GABAPENTIN 300 MG PO CAPS
300.0000 mg | ORAL_CAPSULE | ORAL | Status: AC
  Administered 2020-05-05: 300 mg via ORAL

## 2020-05-05 MED ORDER — OXYCODONE-ACETAMINOPHEN 5-325 MG PO TABS: 1.0000 | ORAL_TABLET | Freq: Four times a day (QID) | ORAL | 0 refills | Status: DC | PRN

## 2020-05-05 MED ORDER — OXYCODONE HCL 5 MG PO TABS
5.0000 mg | ORAL_TABLET | Freq: Once | ORAL | Status: AC | PRN
  Administered 2020-05-05: 5 mg via ORAL

## 2020-05-05 MED ORDER — PROPOFOL 10 MG/ML IV BOLUS
INTRAVENOUS | Status: DC | PRN
  Administered 2020-05-05: 170 mg via INTRAVENOUS
  Administered 2020-05-05: 30 mg via INTRAVENOUS

## 2020-05-05 MED ORDER — GABAPENTIN 300 MG PO CAPS
ORAL_CAPSULE | ORAL | Status: AC
  Filled 2020-05-05: qty 1

## 2020-05-05 MED ORDER — ONDANSETRON HCL 4 MG/2ML IJ SOLN
INTRAMUSCULAR | Status: DC | PRN
  Administered 2020-05-05: 4 mg via INTRAVENOUS

## 2020-05-05 MED ORDER — FENTANYL CITRATE (PF) 100 MCG/2ML IJ SOLN: 25.0000 ug | INTRAMUSCULAR | Status: DC | PRN

## 2020-05-05 MED ORDER — MIDAZOLAM HCL 2 MG/2ML IJ SOLN
INTRAMUSCULAR | Status: DC | PRN
  Administered 2020-05-05: 2 mg via INTRAVENOUS

## 2020-05-05 MED ORDER — FENTANYL CITRATE (PF) 100 MCG/2ML IJ SOLN
INTRAMUSCULAR | Status: AC
  Filled 2020-05-05: qty 2

## 2020-05-05 MED ORDER — SCOPOLAMINE 1 MG/3DAYS TD PT72: 1.0000 | MEDICATED_PATCH | Freq: Once | TRANSDERMAL | Status: DC

## 2020-05-05 MED ORDER — MIDAZOLAM HCL 2 MG/2ML IJ SOLN
INTRAMUSCULAR | Status: AC
  Filled 2020-05-05: qty 2

## 2020-05-05 MED ORDER — DEXAMETHASONE SODIUM PHOSPHATE 10 MG/ML IJ SOLN
INTRAMUSCULAR | Status: AC
  Filled 2020-05-05: qty 1

## 2020-05-05 MED ORDER — DEXAMETHASONE SODIUM PHOSPHATE 10 MG/ML IJ SOLN
INTRAMUSCULAR | Status: DC | PRN
  Administered 2020-05-05: 10 mg via INTRAVENOUS

## 2020-05-05 SURGICAL SUPPLY — 32 items
BAG SPEC RTRVL LRG 6X4 10 (ENDOMECHANICALS)
BLADE SURG 15 STRL LF DISP TIS (BLADE) ×1 IMPLANT
BLADE SURG 15 STRL SS (BLADE) ×2
CABLE HIGH FREQUENCY MONO STRZ (ELECTRODE) IMPLANT
CATH ROBINSON RED A/P 16FR (CATHETERS) IMPLANT
DRSG OPSITE POSTOP 3X4 (GAUZE/BANDAGES/DRESSINGS) IMPLANT
DURAPREP 26ML APPLICATOR (WOUND CARE) ×2 IMPLANT
GAUZE 4X4 16PLY RFD (DISPOSABLE) ×2 IMPLANT
GLOVE SURG LTX SZ7 (GLOVE) ×2 IMPLANT
GLOVE SURG UNDER POLY LF SZ7 (GLOVE) ×8 IMPLANT
GOWN STRL REUS W/TWL LRG LVL3 (GOWN DISPOSABLE) ×4 IMPLANT
NS IRRIG 1000ML POUR BTL (IV SOLUTION) ×2 IMPLANT
PACK LAPAROSCOPY BASIN (CUSTOM PROCEDURE TRAY) ×2 IMPLANT
PACK TRENDGUARD 450 HYBRID PRO (MISCELLANEOUS) IMPLANT
PACK TRENDGUARD 600 HYBRD PROC (MISCELLANEOUS) IMPLANT
PAD OB MATERNITY 4.3X12.25 (PERSONAL CARE ITEMS) ×2 IMPLANT
PAD PREP 24X48 CUFFED NSTRL (MISCELLANEOUS) ×2 IMPLANT
POUCH SPECIMEN RETRIEVAL 10MM (ENDOMECHANICALS) IMPLANT
SET IRRIG TUBING LAPAROSCOPIC (IRRIGATION / IRRIGATOR) ×1 IMPLANT
SET TUBE SMOKE EVAC HIGH FLOW (TUBING) ×2 IMPLANT
SHEARS HARMONIC ACE PLUS 36CM (ENDOMECHANICALS) IMPLANT
SLEEVE ENDOPATH XCEL 5M (ENDOMECHANICALS) IMPLANT
SLEEVE SCD COMPRESS KNEE MED (MISCELLANEOUS) ×2 IMPLANT
SUT VIC AB 3-0 X1 27 (SUTURE) ×2 IMPLANT
SUT VICRYL 0 UR6 27IN ABS (SUTURE) ×4 IMPLANT
TOWEL GREEN STERILE FF (TOWEL DISPOSABLE) ×4 IMPLANT
TRAY FOLEY W/BAG SLVR 14FR LF (SET/KITS/TRAYS/PACK) IMPLANT
TRENDGUARD 450 HYBRID PRO PACK (MISCELLANEOUS)
TRENDGUARD 600 HYBRID PROC PK (MISCELLANEOUS)
TROCAR BALLN 12MMX100 BLUNT (TROCAR) IMPLANT
TROCAR XCEL NON-BLD 5MMX100MML (ENDOMECHANICALS) ×2 IMPLANT
WARMER LAPAROSCOPE (MISCELLANEOUS) ×2 IMPLANT

## 2020-05-05 NOTE — Transfer of Care (Signed)
Immediate Anesthesia Transfer of Care Note  Patient: Veronica Obrien  Procedure(s) Performed: RIGHT LAPAROSCOPIC SALPINGO OOPHORECTOMY, LEFT LAPAROSCOPIC SALPINGECTOMY (N/A Abdomen)  Patient Location: PACU  Anesthesia Type:General  Level of Consciousness: awake, alert  and oriented  Airway & Oxygen Therapy: Patient Spontanous Breathing and Patient connected to face mask oxygen  Post-op Assessment: Report given to RN and Post -op Vital signs reviewed and stable  Post vital signs: Reviewed and stable  Last Vitals:  Vitals Value Taken Time  BP 119/64 05/05/20 1111  Temp 36.7 C 05/05/20 1112  Pulse 72 05/05/20 1114  Resp 15 05/05/20 1114  SpO2 100 % 05/05/20 1114  Vitals shown include unvalidated device data.  Last Pain:  Vitals:   05/05/20 0726  TempSrc: Oral  PainSc: 2       Patients Stated Pain Goal: 4 (99/83/38 2505)  Complications: No complications documented.

## 2020-05-05 NOTE — Interval H&P Note (Signed)
History and Physical Interval Note:  05/05/2020 9:03 AM  Veronica Obrien  has presented today for surgery, with the diagnosis of Ovarian Cyst.  The various methods of treatment have been discussed with the patient and family. After consideration of risks, benefits and other options for treatment, the patient has consented to  Procedure(s): LAPAROSCOPIC SALPINGO OOPHORECTOMY (Bilateral) as a surgical intervention.  The patient's history has been reviewed, patient examined, no change in status, stable for surgery.  I have reviewed the patient's chart and labs.  Questions were answered to the patient's satisfaction.     Donnamae Jude

## 2020-05-05 NOTE — Op Note (Signed)
PROCEDURE DATE: 05/05/2020   PREOPERATIVE DIAGNOSES: Ovarian cyst and undesired fertility  POSTOPERATIVE DIAGNOSES: The same   PROCEDURE: Laparoscopic bilateral salpingectomy, right oophroectomy   SURGEON: Dr. Donnamae Jude   ASSISTANT: Dr. Arvilla Meres An experienced assistant was required given the standard of surgical care given the complexity of the case.  This assistant was needed for exposure, dissection, suctioning, retraction, instrument exchange.   ANESTHESIOLOGIST: Brennan Bailey, MD MD - GET[facility]  INDICATIONS: 40 y.o. [phone] with history of ovarian cyst for some years and increasing pain. Also desires permanent sterility.  FINDINGS: corpus luteum x 2 on left ovary, right ovary filling the cul-de-sac, normal tubes and ovaries   ESTIMATED BLOOD LOSS: 100 ml   SPECIMENS: Bilateral ovaries and fallopian tubes   COMPLICATIONS: None immediately known   PROCEDURE IN DETAIL: The patient received intravenous antibiotics and had sequential compression devices applied to her lower extremities while in the preoperative area. She was then taken to the operating room where general anesthesia was administered and was found to be adequate. She was placed in the dorsal lithotomy position, and was prepped and draped in a sterile manner. A Foley catheter was inserted into her bladder and attached to constant drainage and a sponge on a stick placed inside the vagina. After an adequate timeout was performed, attention was then turned to the patient's abdomen where a 11-mm skin incision was made in the umbilicus.  This was carried down to the underlying fascia and peritoneum.  The fascia was tagged with 0 Vicryl suture on a UR-6. Intraperitoneal placement was confirmed and insufflation done. A survey of the patient's pelvis and abdomen revealed the findings above. Bilateral 5-mm lower quadrant ports were then placed under direct visualization. On the right side, the ureter was identified and well away  from the field. The infundibulopelvic was then clamped and transected with the Harmonic device. Followed by the mesosalpinx and tubo-ovarian connection. Bleeding was stopped with electrocautery function of the Harmonic. The ureter was identified inferior to the operative field. Excellent hemostasis was noted. On the leftt side, the mesosalpinx was then clamped and transected with the Harmonic device until the tube was transected and removed.   The specimen was placed in an Endocatch bag and then removed from the abdomen through the 11-mm port, under direct visualization. The operative site was surveyed, and found to be hemostatic. No intraoperative injury to other surrounding organs was noted. The abdomen was desufflated and all instruments were then removed from the patient's abdomen. No fascial closure was needed. All skin incisions were closed with 3-0 Vicryl subcuticular stitches/Dermabond.   Donnamae Jude MD 05/05/2020 11:01 AM

## 2020-05-05 NOTE — Anesthesia Preprocedure Evaluation (Addendum)
Anesthesia Evaluation  Patient identified by MRN, date of birth, ID band Patient awake    Reviewed: Allergy & Precautions, NPO status , Patient's Chart, lab work & pertinent test results  History of Anesthesia Complications Negative for: history of anesthetic complications  Airway Mallampati: II  TM Distance: >3 FB Neck ROM: Full    Dental  (+) Missing,    Pulmonary former smoker,    Pulmonary exam normal        Cardiovascular negative cardio ROS Normal cardiovascular exam     Neuro/Psych  Headaches, Anxiety Depression    GI/Hepatic negative GI ROS, Neg liver ROS,   Endo/Other  negative endocrine ROS  Renal/GU negative Renal ROS  negative genitourinary   Musculoskeletal negative musculoskeletal ROS (+)   Abdominal   Peds  Hematology negative hematology ROS (+)   Anesthesia Other Findings Day of surgery medications reviewed with patient.  Reproductive/Obstetrics Ovarian cyst                            Anesthesia Physical Anesthesia Plan  ASA: I  Anesthesia Plan: General   Post-op Pain Management:    Induction: Intravenous  PONV Risk Score and Plan: 3 and Treatment may vary due to age or medical condition, Ondansetron, Dexamethasone, Midazolam and Scopolamine patch - Pre-op  Airway Management Planned: Oral ETT  Additional Equipment: None  Intra-op Plan:   Post-operative Plan: Extubation in OR  Informed Consent: I have reviewed the patients History and Physical, chart, labs and discussed the procedure including the risks, benefits and alternatives for the proposed anesthesia with the patient or authorized representative who has indicated his/her understanding and acceptance.     Dental advisory given  Plan Discussed with: CRNA  Anesthesia Plan Comments:        Anesthesia Quick Evaluation

## 2020-05-05 NOTE — Anesthesia Postprocedure Evaluation (Signed)
Anesthesia Post Note  Patient: Veronica Obrien  Procedure(s) Performed: RIGHT LAPAROSCOPIC SALPINGO OOPHORECTOMY, LEFT LAPAROSCOPIC SALPINGECTOMY (N/A Abdomen)     Patient location during evaluation: PACU Anesthesia Type: General Level of consciousness: awake and alert and oriented Pain management: pain level controlled Vital Signs Assessment: post-procedure vital signs reviewed and stable Respiratory status: spontaneous breathing, nonlabored ventilation and respiratory function stable Cardiovascular status: blood pressure returned to baseline Postop Assessment: no apparent nausea or vomiting Anesthetic complications: no   No complications documented.  Last Vitals:  Vitals:   05/05/20 1130 05/05/20 1200  BP: 122/83 119/84  Pulse: 68 72  Resp: 20 18  Temp:  36.7 C  SpO2: 99% 95%    Last Pain:  Vitals:   05/05/20 1130  TempSrc:   PainSc: [place]

## 2020-05-05 NOTE — Discharge Instructions (Signed)
Next dose of Tylenol can be given at 1:30pm if needed. Next dose of NSAID (Motrin/Ibuprofen/Aleve) can be given at 3:30pm if needed. Oxycodone given at 11:45. Next dose of Percocet can be given at 5:45pm if needed.    Diagnostic Laparoscopy, Care After The following information offers guidance on how to care for yourself after your procedure. Your health care provider may also give you more specific instructions. If you have problems or questions, contact your health care provider. What can I expect after the procedure? After the procedure, it is common to have:  Mild discomfort in the abdomen.  Sore throat. Women who have laparoscopy with a pelvic examination may have mild cramping and fluid coming from the vagina for a few days after the procedure. Follow these instructions at home: Medicines  Take over-the-counter and prescription medicines only as told by your health care provider.  If you were prescribed an antibiotic medicine, take it as told by your health care provider. Do not stop taking the antibiotic even if you start to feel better.  Ask your health care provider if the medicine prescribed to you: ? Requires you to avoid driving or using machinery. ? Can cause constipation. You may need to take these actions to prevent or treat constipation:  Drink enough fluid to keep your urine pale yellow.  Take over-the-counter or prescription medicines.  Eat foods that are high in fiber, such as beans, whole grains, and fresh fruits and vegetables.  Limit foods that are high in fat and processed sugars, such as fried or sweet foods. Incision care  Follow instructions from your health care provider about how to take care of your incisions. Make sure you: ? Wash your hands with soap and water for at least 20 seconds before and after you change your bandage (dressing). If soap and water are not available, use hand sanitizer. ? Change your dressing as told by your health care  provider. ? Leave stitches (sutures), skin glue, or surgical tape in place. These skin closures may need to stay in place for 2 weeks or longer. If surgical tape edges start to loosen and curl up, you may trim the loose edges. Do not remove the surgical tape completely unless your health care provider tells you to do that.  Check your incision areas every day for signs of infection. Check for: ? Redness, swelling, or pain. ? Fluid or blood. ? Warmth. ? Pus or a bad smell.   Activity  Return to your normal activities as told by your health care provider. Ask your health care provider what activities are safe for you.  Do not lift anything that is heavier than 10 lb (4.5 kg), or the limit that you are told, until your health care provider says that it is safe.  Avoid sitting for a long time without moving. Get up to take short walks every 1-2 hours. This is important to improve blood flow and breathing. Ask for help if you feel weak or unsteady. General instructions  Do not use any products that contain nicotine or tobacco. These products include cigarettes, chewing tobacco, and vaping devices, such as e-cigarettes. If you need help quitting, ask your health care provider.  If you were given a sedative during the procedure, it can affect you for several hours. Do not drive or operate machinery until your health care provider says that it is safe.  Do not take baths, swim, or use a hot tub until your health care provider approves. Ask your  health care provider if you may take showers. You may only be allowed to take sponge baths.  Keep all follow-up visits. This is important. Contact a health care provider if:  You develop shoulder pain.  You feel light-headed or faint.  You are unable to pass gas or have a bowel movement.  You feel nauseous or you vomit.  You develop a rash.  You have any of these signs of infection: ? Redness, swelling, or pain around an incision. ? Fluid or  blood coming from an incision. ? Warmth coming from an incision. ? Pus or a bad smell coming from an incision. ? A fever or chills. Get help right away if:  You have severe pain.  You have vomiting that does not go away.  You have heavy bleeding from the vagina.  Any incision opens up.  You have trouble breathing.  You have chest pain. These symptoms may represent a serious problem that is an emergency. Do not wait to see if the symptoms will go away. Get medical help right away. Call your local emergency services (911 in the [place] Do not drive yourself to the hospital. Summary  After the procedure, it is common to have mild discomfort in the abdomen and a sore throat.  Check your incision areas every day for signs of infection.  Return to your normal activities as told by your health care provider. Ask your health care provider what activities are safe for you. This information is not intended to replace advice given to you by your health care provider. Make sure you discuss any questions you have with your health care provider. Document Revised: 11/14/2019 Document Reviewed: 11/14/2019 Elsevier Patient Education  2021 [employer] Instructions  Activity: Get plenty of rest for the remainder of the day. A responsible individual must stay with you for 24 hours following the procedure.  For the next 24 hours, DO NOT: -Drive a car -Paediatric nurse -Drink alcoholic beverages -Take any medication unless instructed by your physician -Make any legal decisions or sign important papers.  Meals: Start with liquid foods such as gelatin or soup. Progress to regular foods as tolerated. Avoid greasy, spicy, heavy foods. If nausea and/or vomiting occur, drink only clear liquids until the nausea and/or vomiting subsides. Call your physician if vomiting continues.  Special Instructions/Symptoms: Your throat may feel dry or sore from the anesthesia or the  breathing tube placed in your throat during surgery. If this causes discomfort, gargle with warm salt water. The discomfort should disappear within 24 hours.  If you had a scopolamine patch placed behind your ear for the management of post- operative nausea and/or vomiting:  1. The medication in the patch is effective for 72 hours, after which it should be removed.  Wrap patch in a tissue and discard in the trash. Wash hands thoroughly with soap and water. 2. You may remove the patch earlier than 72 hours if you experience unpleasant side effects which may include dry mouth, dizziness or visual disturbances. 3. Avoid touching the patch. Wash your hands with soap and water after contact with the patch.

## 2020-05-05 NOTE — Anesthesia Procedure Notes (Signed)
Procedure Name: Intubation Date/Time: 05/05/2020 9:45 AM Performed by: Genelle Bal, CRNA Pre-anesthesia Checklist: Patient identified, Emergency Drugs available, Suction available and Patient being monitored Patient Re-evaluated:Patient Re-evaluated prior to induction Oxygen Delivery Method: Circle system utilized Preoxygenation: Pre-oxygenation with 100% oxygen Induction Type: IV induction Ventilation: Mask ventilation without difficulty Laryngoscope Size: Miller and 2 Grade View: Grade II Tube type: Oral Number of attempts: 1 Airway Equipment and Method: Stylet and Oral airway Placement Confirmation: ETT inserted through vocal cords under direct vision,  positive ETCO2 and breath sounds checked- equal and bilateral Secured at: 20.5 cm Tube secured with: Tape Dental Injury: Teeth and Oropharynx as per pre-operative assessment

## 2020-05-06 ENCOUNTER — Encounter (HOSPITAL_BASED_OUTPATIENT_CLINIC_OR_DEPARTMENT_OTHER): Payer: Self-pay | Admitting: Family Medicine

## 2020-05-06 LAB — SURGICAL PATHOLOGY

## 2020-05-09 ENCOUNTER — Ambulatory Visit (HOSPITAL_COMMUNITY)
Admission: EM | Admit: 2020-05-09 | Discharge: 2020-05-09 | Disposition: A | Discharge status: Home / Self Care | Payer: BC Managed Care – PPO | Attending: Emergency Medicine | Admitting: Emergency Medicine

## 2020-05-09 ENCOUNTER — Other Ambulatory Visit: Payer: Self-pay

## 2020-05-09 ENCOUNTER — Encounter (HOSPITAL_COMMUNITY): Payer: Self-pay

## 2020-05-09 DIAGNOSIS — G8918 Other acute postprocedural pain: Secondary | ICD-10-CM | POA: Diagnosis present

## 2020-05-09 DIAGNOSIS — N83201 Unspecified ovarian cyst, right side: Secondary | ICD-10-CM

## 2020-05-09 LAB — CBC
HCT: 38.2 % (ref 36.0–46.0)
Hemoglobin: 12.9 g/dL (ref 12.0–15.0)
MCH: 28.7 pg (ref 26.0–34.0)
MCHC: 33.8 g/dL (ref 30.0–36.0)
MCV: 85.1 fL (ref 80.0–100.0)
Platelets: 383 10*3/uL (ref 150–400)
RBC: 4.49 MIL/uL (ref 3.87–5.11)
RDW: 13.4 % (ref 11.5–15.5)
WBC: 9.6 10*3/uL (ref 4.0–10.5)
nRBC: 0 % (ref 0.0–0.2)

## 2020-05-09 MED ORDER — DOCUSATE SODIUM 100 MG PO CAPS: 100.0000 mg | ORAL_CAPSULE | Freq: Two times a day (BID) | ORAL | 0 refills | Status: DC

## 2020-05-09 MED ORDER — OXYCODONE-ACETAMINOPHEN 5-325 MG PO TABS: 2.0000 | ORAL_TABLET | Freq: Three times a day (TID) | ORAL | 0 refills | Status: DC | PRN

## 2020-05-09 NOTE — ED Triage Notes (Signed)
Pt in with c/o sharp  right abdominal pain that has been going on for 2 days since she had laparoscopic oophorectomy on 05/05/2020.    Pt has been taking oxycodone, tylenol,and advil for sxs relief

## 2020-05-09 NOTE — ED Provider Notes (Signed)
Veronica Obrien    CSN: [id number] Arrival date & time: 05/09/20  1509      History   Chief Complaint Chief Complaint  Patient presents with  . Abdominal Pain    HPI Veronica Obrien is a 40 y.o. female.   Veronica Obrien presents with complaints of post operative abdominal pain. She had a laparoscopic bilateral salpingo oophorectomy related to a complex ovarian cyst, on 2/2. Over the past three days she has been feeling a sharp pain shooting from umbilical region. Worse with movements. No nausea or vomiting. There is no oozing or bleeding, redness or swelling at lap sites. No visible abdominal bruising or hematoma. No dizziness. No fevers. She has ran out of her percocet and feels that pain is now worse, she took the last one at 0500 this am. She hasn't had a Bm in a few days now, has only passed one stool since surgery, she is not taking a stool softener. She is passing gas.     ROS per HPI, negative if not otherwise mentioned.      Past Medical History:  Diagnosis Date  . Anxiety   . Depression   . Medical history non-contributory     Patient Active Problem List   Diagnosis Date Noted  . Encounter for sterilization 04/22/2020  . Pelvic pain 12/09/2019  . History of gestational diabetes 03/07/2015  . Obesity (BMI 30-39.9) 12/24/2014  . Right ovarian cyst 12/24/2014  . 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 LAPAROSCOPIC SALPINGO OOPHERECTOMY N/A 05/05/2020   Procedure: RIGHT LAPAROSCOPIC SALPINGO OOPHORECTOMY, LEFT LAPAROSCOPIC SALPINGECTOMY;  Surgeon: Donnamae Jude, MD;  Location: [place];  Service: Gynecology;  Laterality: N/A;  . WISDOM TOOTH EXTRACTION      OB History    Gravida  2   Para  2   Term  1   Preterm  1   AB  0   Living  2     SAB  0   IAB  0   Ectopic  0   Multiple  0   Live Births  2            Home Medications    Prior to Admission medications    Medication Sig Start Date End Date Taking? Authorizing Provider  docusate sodium (COLACE) 100 MG capsule Take 1 capsule (100 mg total) by mouth every 12 (twelve) hours. 05/09/20  Yes Augusto Gamble B, NP  oxyCODONE-acetaminophen (PERCOCET/ROXICET) 5-325 MG tablet Take 2 tablets by mouth every 8 (eight) hours as needed for severe pain. 05/09/20  Yes Augusto Gamble B, NP  Fluocinolone Acetonide Scalp 0.01 % OIL Apply 1 application topically 2 (two) times daily. Patient not taking: No sig reported 09/25/19   [provider]  hydrocortisone (ANUSOL-HC) 2.5 % rectal cream Place rectally 2 (two) times daily. 07/20/19   [provider]  Omega-3 1000 MG CAPS     [provider]  Probiotic Product (PROBIOTIC-10) CHEW     [provider]  triamcinolone cream (KENALOG) 0.1 % Apply 1 application topically 2 (two) times daily. 12/14/16   Barnet Glasgow, NP    Family History Family History  Problem Relation Age of Onset  . Cancer Sister        lymphoma non-Hodgkins  . Arthritis Father   . Heart disease Maternal Grandfather   . Diabetes Maternal Grandfather   . Diabetes Paternal Grandmother   . Breast cancer  Paternal Grandmother        35  . Diabetes Paternal Grandfather     Social History Social History   Tobacco Use  . Smoking status: Former Smoker    Packs/day: 0.00    Types: Cigarettes    Quit date: 06/06/2014    Years since quitting: 5.9  . Smokeless tobacco: Never Used  Substance Use Topics  . Alcohol use: No    Comment: NOT SINCE PREGNANCY  . Drug use: Yes    Types: Marijuana    Comment: 1x per year     Allergies   Escitalopram oxalate, Novocain [procaine hcl], and Venlafaxine hcl er   Review of Systems Review of Systems   Physical Exam Triage Vital Signs ED Triage Vitals  Enc Vitals Group     BP 05/09/20 1525 134/73     Pulse Rate 05/09/20 1525 75     Resp 05/09/20 1525 18     Temp 05/09/20 1525 99.2 F (37.3 C)     Temp src --       SpO2 05/09/20 1525 98 %     Weight --      Height --      Head Circumference --      Peak Flow --      Pain Score 05/09/20 1522 7     Pain Loc --      Pain Edu? --      Excl. in [place]? --    No data found.  Updated Vital Signs BP 134/73   Pulse 75   Temp 99.2 F (37.3 C)   Resp 18   LMP 04/14/2020 (Exact Date)   SpO2 98%   Visual Acuity Right Eye Distance:   Left Eye Distance:   Bilateral Distance:    Right Eye Near:   Left Eye Near:    Bilateral Near:     Physical Exam Constitutional:      General: She is not in acute distress.    Appearance: She is well-developed.  Cardiovascular:     Rate and Rhythm: Normal rate.  Pulmonary:     Effort: Pulmonary effort is normal.  Abdominal:     Tenderness: There is abdominal tenderness in the periumbilical area.     Comments: Three lap sites in total, bilateral low abdomen as well as umbilical; no redness swelling warmth or drainage; appear well healing; tenderness about umbilicus   Skin:    General: Skin is warm and dry.  Neurological:     Mental Status: She is alert and oriented to person, place, and time.      UC Treatments / Results  Labs (all labs ordered are listed, but only abnormal results are displayed) Labs Reviewed  CBC    EKG   Radiology No results found.  Procedures Procedures (including critical care time)  Medications Ordered in UC Medications - No data to display  Initial Impression / Assessment and Plan / UC Course  I have reviewed the triage vital signs and the nursing notes.  Pertinent labs & imaging results that were available during my care of the patient were reviewed by me and considered in my medical decision making (see chart for details).     Post operative abdominal pain, surgery was 2/2. Out of percocet. Will check cbc to ensure no gross anemia with concern for internal bleeding, or significant WBC. Encouraged use of stool softener as well while taking narcotic. Percocet  refilled with follow up and ER precautions discussed. Patient verbalized understanding  and agreeable to plan.   Final Clinical Impressions(s) / UC Diagnoses   Final diagnoses:  Post-operative pain     Discharge Instructions     I am checking basic lab to ensure there is not an obvious loss of blood or significant infection present. I will call you if I am concerned with your results.  I have refilled your percocet to take as needed for severe pain.  Colace 1-2 times a day to promote regular bowel movements may also help.  Regular activity as well as plenty of water as well.  Please follow up with your surgeon or surgeon's office if symptoms persist.  If worsening of pain, vomiting, fevers, swelling, or otherwise worsening please go to the ER for further evaluation.    ED Prescriptions    Medication Sig Dispense Auth. Provider   oxyCODONE-acetaminophen (PERCOCET/ROXICET) 5-325 MG tablet Take 2 tablets by mouth every 8 (eight) hours as needed for severe pain. 15 tablet Augusto Gamble B, NP   docusate sodium (COLACE) 100 MG capsule Take 1 capsule (100 mg total) by mouth every 12 (twelve) hours. 60 capsule Augusto Gamble B, NP     I have reviewed the PDMP during this encounter.   Zigmund Gottron, NP 05/09/20 1553

## 2020-05-09 NOTE — Discharge Instructions (Signed)
I am checking basic lab to ensure there is not an obvious loss of blood or significant infection present. I will call you if I am concerned with your results.  I have refilled your percocet to take as needed for severe pain.  Colace 1-2 times a day to promote regular bowel movements may also help.  Regular activity as well as plenty of water as well.  Please follow up with your surgeon or surgeon's office if symptoms persist.  If worsening of pain, vomiting, fevers, swelling, or otherwise worsening please go to the ER for further evaluation.

## 2020-05-10 ENCOUNTER — Encounter: Payer: Self-pay | Admitting: *Deleted

## 2020-05-10 MED ORDER — OXYCODONE-ACETAMINOPHEN 5-325 MG PO TABS: 1.0000 | ORAL_TABLET | Freq: Four times a day (QID) | ORAL | 0 refills | Status: DC | PRN

## 2020-05-19 ENCOUNTER — Telehealth (INDEPENDENT_AMBULATORY_CARE_PROVIDER_SITE_OTHER): Payer: BC Managed Care – PPO | Admitting: Family Medicine

## 2020-05-19 ENCOUNTER — Other Ambulatory Visit: Payer: Self-pay

## 2020-05-19 ENCOUNTER — Encounter: Payer: Self-pay | Admitting: *Deleted

## 2020-05-19 DIAGNOSIS — N83201 Unspecified ovarian cyst, right side: Secondary | ICD-10-CM

## 2020-05-19 DIAGNOSIS — Z09 Encounter for follow-up examination after completed treatment for conditions other than malignant neoplasm: Secondary | ICD-10-CM

## 2020-05-19 NOTE — Progress Notes (Signed)
GYNECOLOGY VIRTUAL VISIT ENCOUNTER NOTE  Provider location: [facility] [place] [facility]   I connected with [name] on 05/19/20 at  2:15 PM EST by MyChart Video Encounter at home and verified that I am speaking with the correct person using two identifiers.   I discussed the limitations, risks, security and privacy concerns of performing an evaluation and management service virtually and the availability of in person appointments. I also discussed with the patient that there may be a patient responsible charge related to this service. The patient expressed understanding and agreed to proceed.   History:  Veronica Obrien is a 40 y.o. [id number] female being evaluated today for postop check. She denies any abnormal vaginal discharge, bleeding, pelvic pain or other concerns.       Past Medical History:  Diagnosis Date  . Anxiety   . Depression   . Medical history non-contributory    Past Surgical History:  Procedure Laterality Date  . LAPAROSCOPIC SALPINGO OOPHERECTOMY N/A 05/05/2020   Procedure: RIGHT LAPAROSCOPIC SALPINGO OOPHORECTOMY, LEFT LAPAROSCOPIC SALPINGECTOMY;  Surgeon: Donnamae Jude, MD;  Location: [place];  Service: Gynecology;  Laterality: N/A;  . WISDOM TOOTH EXTRACTION     The following portions of the patient's history were reviewed and updated as appropriate: allergies, current medications, past family history, past medical history, past social history, past surgical history and problem list.   Health Maintenance:  Normal pap and negative HRHPV on 02/17/2019.    Review of Systems:  Pertinent items noted in HPI and remainder of comprehensive ROS otherwise negative.  Physical Exam:   General:  Alert, oriented and cooperative. Patient appears to be in no acute distress.  Mental Status: Normal mood and affect. Normal behavior. Normal judgment and thought content.   Respiratory: Normal respiratory effort, no problems with  respiration noted  Rest of physical exam deferred due to type of encounter  Labs and Imaging Results for orders placed or performed during the hospital encounter of 05/09/20 (from the past 336 hour(s))  CBC   Collection Time: 05/09/20  3:43 PM  Result Value Ref Range   WBC 9.6 4.0 - 10.5 K/uL   RBC 4.49 3.87 - 5.11 MIL/uL   Hemoglobin 12.9 12.0 - 15.0 g/dL   HCT 38.2 36.0 - 46.0 %   MCV 85.1 80.0 - 100.0 fL   MCH 28.7 26.0 - 34.0 pg   MCHC 33.8 30.0 - 36.0 g/dL   RDW 13.4 11.5 - 15.5 %   Platelets 383 150 - 400 K/uL   nRBC 0.0 0.0 - 0.2 %   No results found.     Assessment and Plan:          Problem List Items Addressed This Visit      Unprioritized   Right ovarian cyst    S/p removal. Pathology reviewed seromucinous cystadenoma.       Other Visit Diagnoses    Postop check    -  Primary   doing well, still with some mild pain. Return to work on 06/01/2020        I discussed the assessment and treatment plan with the patient. The patient was provided an opportunity to ask questions and all were answered. The patient agreed with the plan and demonstrated an understanding of the instructions.   The patient was advised to call back or seek an in-person evaluation/go to the ED if the symptoms worsen or if the condition fails to improve as  anticipated.  I provided 14 minutes of face-to-face time during this encounter.   Donnamae Jude, MD [facility] [employer], [place]

## 2020-05-19 NOTE — Assessment & Plan Note (Signed)
S/p removal. Pathology reviewed seromucinous cystadenoma.

## 2020-05-20 ENCOUNTER — Encounter: Payer: Self-pay | Admitting: Radiology

## 2020-05-21 ENCOUNTER — Encounter (HOSPITAL_COMMUNITY): Payer: Self-pay

## 2020-05-21 ENCOUNTER — Other Ambulatory Visit: Payer: Self-pay

## 2020-05-21 ENCOUNTER — Ambulatory Visit (HOSPITAL_COMMUNITY)
Admission: EM | Admit: 2020-05-21 | Discharge: 2020-05-21 | Disposition: A | Discharge status: Home / Self Care | Payer: BC Managed Care – PPO | Attending: Urgent Care | Admitting: Urgent Care

## 2020-05-21 DIAGNOSIS — Z90722 Acquired absence of ovaries, bilateral: Secondary | ICD-10-CM

## 2020-05-21 DIAGNOSIS — R1030 Lower abdominal pain, unspecified: Secondary | ICD-10-CM | POA: Diagnosis not present

## 2020-05-21 DIAGNOSIS — Z9079 Acquired absence of other genital organ(s): Secondary | ICD-10-CM

## 2020-05-21 MED ORDER — NAPROXEN 500 MG PO TABS: 500.0000 mg | ORAL_TABLET | Freq: Two times a day (BID) | ORAL | 0 refills | Status: DC

## 2020-05-21 NOTE — ED Triage Notes (Signed)
Pt c/o abdominal pain left of umbilicus and left/right suprapubic area onset yesterday s/p lap unilateral oophorectomy and ovarian cyst/mass removal approx 16 days ago. Pt also c/o abdominal pain when having BM.   Pt states she had post-op pain and was re-evaluated here for pain several days ago to r/o infection.   Denies fever, n/v/d, drainage from surgical sites, dysuria symptoms.  Last took ibuprofen 400mg  approx 5 hours PTA.

## 2020-05-21 NOTE — ED Provider Notes (Signed)
[facility]   MRN: [id number] DOB: Aug 02, 1980  Subjective:   Veronica Obrien is a 40 y.o. female presenting for recheck on her abdominal pain.  Patient had bilateral salpingo-oophorectomy earlier this month.  Has had 2 checkups since then.  Has used Tylenol and Advil at a dose of 400 mg 2-3 times a day as needed.  Denies fever, nausea, vomiting, bloody stools, abdominal distention.  No warmth, erythema or discharge from her surgical sites.  She did a follow-up visit through telemedicine with her gynecology practice that was unremarkable.  At her last office visit with [place] she had a normal CBC.  No current facility-administered medications for this encounter.  Current Outpatient Medications:  .  oxyCODONE-acetaminophen (PERCOCET/ROXICET) 5-325 MG tablet, Take 1-2 tablets by mouth every 6 (six) hours as needed. (Patient not taking: Reported on 05/19/2020), Disp: 15 tablet, Rfl: 0 .  acetaminophen (TYLENOL) 500 MG tablet, Take 500 mg by mouth every 6 (six) hours as needed., Disp: , Rfl:  .  docusate sodium (COLACE) 100 MG capsule, Take 1 capsule (100 mg total) by mouth every 12 (twelve) hours. (Patient not taking: Reported on 05/19/2020), Disp: 60 capsule, Rfl: 0 .  Fluocinolone Acetonide Scalp 0.01 % OIL, Apply 1 application topically 2 (two) times daily. (Patient not taking: No sig reported), Disp: , Rfl:  .  hydrocortisone (ANUSOL-HC) 2.5 % rectal cream, Place rectally 2 (two) times daily. (Patient not taking: Reported on 05/19/2020), Disp: , Rfl:  .  ibuprofen (ADVIL) 100 MG chewable tablet, Chew by mouth every 8 (eight) hours as needed., Disp: , Rfl:  .  Omega-3 1000 MG CAPS, , Disp: , Rfl:  .  Probiotic Product (PROBIOTIC-10) CHEW, , Disp: , Rfl:  .  triamcinolone cream (KENALOG) 0.1 %, Apply 1 application topically 2 (two) times daily., Disp: 30 g, Rfl: 2 .  Turmeric (QC TUMERIC COMPLEX PO), Take by mouth., Disp: , Rfl:    Allergies  Allergen Reactions  . Escitalopram  Oxalate     Other reaction(s): bad dreams  . Novocain [Procaine Hcl] Other (See Comments)    Pt states that it makes her try to bite her tongue, mouth stayed numb for two days  . Venlafaxine Hcl Er     Other reaction(s): anger worse    Past Medical History:  Diagnosis Date  . Anxiety   . Depression   . Medical history non-contributory      Past Surgical History:  Procedure Laterality Date  . LAPAROSCOPIC SALPINGO OOPHERECTOMY N/A 05/05/2020   Procedure: RIGHT LAPAROSCOPIC SALPINGO OOPHORECTOMY, LEFT LAPAROSCOPIC SALPINGECTOMY;  Surgeon: Donnamae Jude, MD;  Location: [place];  Service: Gynecology;  Laterality: N/A;  . WISDOM TOOTH EXTRACTION      Family History  Problem Relation Age of Onset  . Cancer Sister        lymphoma non-Hodgkins  . Arthritis Father   . Heart disease Maternal Grandfather   . Diabetes Maternal Grandfather   . Diabetes Paternal Grandmother   . Breast cancer Paternal Grandmother        48  . Diabetes Paternal Grandfather     Social History   Tobacco Use  . Smoking status: Former Smoker    Packs/day: 0.00    Types: Cigarettes    Quit date: 06/06/2014    Years since quitting: 5.9  . Smokeless tobacco: Never Used  Substance Use Topics  . Alcohol use: No    Comment: NOT SINCE PREGNANCY  . Drug  use: Yes    Types: Marijuana    Comment: 1x per year    ROS   Objective:   Vitals: BP 122/77 (BP Location: Right Arm)   Pulse 87   Temp 98.3 F (36.8 C) (Oral)   Resp 20   LMP 05/14/2020 (Approximate)   SpO2 100%   Physical Exam Constitutional:      General: She is not in acute distress.    Appearance: Normal appearance. She is well-developed. She is obese. She is not ill-appearing, toxic-appearing or diaphoretic.  HENT:     Head: Normocephalic and atraumatic.     Nose: Nose normal.     Mouth/Throat:     Mouth: Mucous membranes are moist.     Pharynx: Oropharynx is clear.  Eyes:     General: No scleral icterus.     Extraocular Movements: Extraocular movements intact.     Pupils: Pupils are equal, round, and reactive to light.  Cardiovascular:     Rate and Rhythm: Normal rate.  Pulmonary:     Effort: Pulmonary effort is normal.  Abdominal:     General: Bowel sounds are normal. There is no distension.     Palpations: Abdomen is soft.     Tenderness: There is abdominal tenderness in the periumbilical area. There is no right CVA tenderness, guarding or rebound.     Comments: Resolving surgical sites from the ports used in the laparoscopic surgery.  Skin:    General: Skin is warm and dry.  Neurological:     General: No focal deficit present.     Mental Status: She is alert and oriented to person, place, and time.  Psychiatric:        Mood and Affect: Mood normal.        Behavior: Behavior normal.            Assessment and Plan :   I have reviewed the PDMP during this encounter.  1. Lower abdominal pain   2. H/O bilateral salpingo-oophorectomy     Well-appearing wounds.  Recommended conservative management with general wound care, naproxen for pain and inflammation.  Maintain follow-up with surgery. Counseled patient on potential for adverse effects with medications prescribed/recommended today, ER and return-to-clinic precautions discussed, patient verbalized understanding.    Jaynee Eagles, [place] 05/21/20 [id number]

## 2020-06-23 DIAGNOSIS — N83201 Unspecified ovarian cyst, right side: Secondary | ICD-10-CM | POA: Diagnosis present

## 2020-06-23 DIAGNOSIS — Z302 Encounter for sterilization: Secondary | ICD-10-CM

## 2020-08-15 ENCOUNTER — Ambulatory Visit
Admission: EM | Admit: 2020-08-15 | Discharge: 2020-08-15 | Disposition: A | Discharge status: Home / Self Care | Payer: BC Managed Care – PPO | Attending: Emergency Medicine | Admitting: Emergency Medicine

## 2020-08-15 ENCOUNTER — Other Ambulatory Visit: Payer: Self-pay

## 2020-08-15 ENCOUNTER — Encounter: Payer: Self-pay | Admitting: Emergency Medicine

## 2020-08-15 DIAGNOSIS — R197 Diarrhea, unspecified: Secondary | ICD-10-CM

## 2020-08-15 DIAGNOSIS — W57XXXA Bitten or stung by nonvenomous insect and other nonvenomous arthropods, initial encounter: Secondary | ICD-10-CM

## 2020-08-15 DIAGNOSIS — R42 Dizziness and giddiness: Secondary | ICD-10-CM

## 2020-08-15 DIAGNOSIS — S30860A Insect bite (nonvenomous) of lower back and pelvis, initial encounter: Secondary | ICD-10-CM

## 2020-08-15 MED ORDER — DOXYCYCLINE HYCLATE 100 MG PO CAPS: 100.0000 mg | ORAL_CAPSULE | Freq: Two times a day (BID) | ORAL | 0 refills | Status: AC

## 2020-08-15 NOTE — ED Triage Notes (Signed)
Pt sts woke up this am with itching all over and had diarrhea; pt sts took some benadryl which resolved itching; pt sts feels "shaky" at present; pt concerned she may have had bug bite yesterday while working in yard

## 2020-08-15 NOTE — Discharge Instructions (Addendum)
Screening for tickborne illnesses and checking basic labs-we will call if anything is abnormal, may see results on MyChart Begin doxycycline twice daily for the next week, will stop if lab results negative Rest and drink plenty of fluids Continue antihistamines-daily cetirizine/Zyrtec or loratadine/Claritin in the morning, Benadryl in the evening Please follow-up if not improving or worsening

## 2020-08-16 LAB — NOVEL CORONAVIRUS, NAA: SARS-CoV-2, NAA: NOT DETECTED

## 2020-08-16 LAB — SARS-COV-2, NAA 2 DAY TAT

## 2020-08-17 NOTE — ED Provider Notes (Signed)
[facility]    CSN: [id number] Arrival date & time: 08/15/20  1438      History   Chief Complaint Chief Complaint  Patient presents with  . Generalized Body Aches    HPI Veronica Obrien is a 40 y.o. female presenting today for evaluation of lightheadedness, headache and generalized itching.  Patient reports that this morning she woke up with an episode of diarrhea which only lasted a few hours with associated abdominal pain.  The symptoms have resolved, but since she has had generalized fatigue, lightheadedness and shaking feeling.  She does report that she was working out in the yard yesterday is concerned about possible bug bite.  She does report tick bite to left upper back approximately 2 to 3 weeks ago.  Denies other rashes.  Denies URI symptoms.  Had COVID in January 2022.  Itching resolved with taking Benadryl. HPI  Past Medical History:  Diagnosis Date  . Anxiety   . Depression   . Medical history non-contributory     Patient Active Problem List   Diagnosis Date Noted  . Encounter for sterilization 04/22/2020  . Pelvic pain 12/09/2019  . History of gestational diabetes 03/07/2015  . Obesity (BMI 30-39.9) 12/24/2014  . Right ovarian cyst 12/24/2014  . Complicated migraine 02/54/2706    Past Surgical History:  Procedure Laterality Date  . LAPAROSCOPIC SALPINGO OOPHERECTOMY N/A 05/05/2020   Procedure: RIGHT LAPAROSCOPIC SALPINGO OOPHORECTOMY, LEFT LAPAROSCOPIC SALPINGECTOMY;  Surgeon: Donnamae Jude, MD;  Location: [facility];  Service: Gynecology;  Laterality: N/A;  . WISDOM TOOTH EXTRACTION      OB History    Gravida  2   Para  2   Term  1   Preterm  1   AB  0   Living  2     SAB  0   IAB  0   Ectopic  0   Multiple  0   Live Births  2            Home Medications    Prior to Admission medications   Medication Sig Start Date End Date Taking? Authorizing Provider  doxycycline (VIBRAMYCIN) 100 MG capsule Take 1  capsule (100 mg total) by mouth 2 (two) times daily for 7 days. 08/15/20 08/22/20 Yes Jeny Nield C, PA-C  acetaminophen (TYLENOL) 500 MG tablet Take 500 mg by mouth every 6 (six) hours as needed.    [provider]  ibuprofen (ADVIL) 100 MG chewable tablet Chew by mouth every 8 (eight) hours as needed.    [provider]  naproxen (NAPROSYN) 500 MG tablet Take 1 tablet (500 mg total) by mouth 2 (two) times daily with a meal. 05/21/20   Jaynee Eagles, PA-C  Omega-3 1000 MG CAPS     [provider]  Probiotic Product (PROBIOTIC-10) CHEW     [provider]  triamcinolone cream (KENALOG) 0.1 % Apply 1 application topically 2 (two) times daily. 12/14/16   Barnet Glasgow, NP  Turmeric (QC TUMERIC COMPLEX PO) Take by mouth.    [provider]    Family History Family History  Problem Relation Age of Onset  . Cancer Sister        lymphoma non-Hodgkins  . Arthritis Father   . Heart disease Maternal Grandfather   . Diabetes Maternal Grandfather   . Diabetes Paternal Grandmother   . Breast cancer Paternal Grandmother        46  . Diabetes Paternal Grandfather  Social History Social History   Tobacco Use  . Smoking status: Former Smoker    Packs/day: 0.00    Types: Cigarettes    Quit date: 06/06/2014    Years since quitting: 6.2  . Smokeless tobacco: Never Used  Substance Use Topics  . Alcohol use: No    Comment: NOT SINCE PREGNANCY  . Drug use: Yes    Types: Marijuana    Comment: 1x per year     Allergies   Escitalopram oxalate, Novocain [procaine hcl], and Venlafaxine hcl er   Review of Systems Review of Systems  Constitutional: Negative for fatigue and fever.  HENT: Negative for congestion, sinus pressure and sore throat.   Eyes: Negative for photophobia, pain and visual disturbance.  Respiratory: Negative for cough and shortness of breath.   Cardiovascular: Negative for chest pain.  Gastrointestinal: Negative for abdominal  pain, nausea and vomiting.  Genitourinary: Negative for decreased urine volume and hematuria.  Musculoskeletal: Negative for myalgias, neck pain and neck stiffness.  Neurological: Positive for light-headedness and headaches. Negative for dizziness, syncope, facial asymmetry, speech difficulty, weakness and numbness.     Physical Exam Triage Vital Signs ED Triage Vitals  Enc Vitals Group     BP 08/15/20 1555 125/80     Pulse Rate 08/15/20 1555 62     Resp 08/15/20 1555 18     Temp 08/15/20 1555 98.7 F (37.1 C)     Temp Source 08/15/20 1555 Oral     SpO2 08/15/20 1555 98 %     Weight --      Height --      Head Circumference --      Peak Flow --      Pain Score 08/15/20 1556 0     Pain Loc --      Pain Edu? --      Excl. in [place]? --    No data found.  Updated Vital Signs BP 125/80 (BP Location: Left Arm)   Pulse 62   Temp 98.7 F (37.1 C) (Oral)   Resp 18   SpO2 98%   Visual Acuity Right Eye Distance:   Left Eye Distance:   Bilateral Distance:    Right Eye Near:   Left Eye Near:    Bilateral Near:     Physical Exam Vitals and nursing note reviewed.  Constitutional:      Appearance: She is well-developed.     Comments: No acute distress  HENT:     Head: Normocephalic and atraumatic.     Ears:     Comments: Bilateral ears without tenderness to palpation of external auricle, tragus and mastoid, EAC's without erythema or swelling, TM's with good bony landmarks and cone of light. Non erythematous.     Nose: Nose normal.     Mouth/Throat:     Comments: Oral mucosa pink and moist, no tonsillar enlargement or exudate. Posterior pharynx patent and nonerythematous, no uvula deviation or swelling. Normal phonation.  Eyes:     Conjunctiva/sclera: Conjunctivae normal.  Cardiovascular:     Rate and Rhythm: Normal rate.  Pulmonary:     Effort: Pulmonary effort is normal. No respiratory distress.     Comments: Breathing comfortably at rest, CTABL, no wheezing, rales or  other adventitious sounds auscultated  Abdominal:     General: There is no distension.     Comments: Soft, nondistended, nontender lightly palpation throughout abdomen  Musculoskeletal:        General: Normal range of motion.  Cervical back: Neck supple.  Skin:    General: Skin is warm and dry.     Comments: Small slightly raised erythematous area with central punctate lesion to left upper back, no other rashes noted to skin  Neurological:     Mental Status: She is alert and oriented to person, place, and time.      UC Treatments / Results  Labs (all labs ordered are listed, but only abnormal results are displayed) Labs Reviewed  NOVEL CORONAVIRUS, NAA   Narrative:    Performed at:  [address], [place], [place]  [id number] Lab Director: Rush Farmer MD, Phone:  [phone]  SARS-COV-2, NAA 2 DAY TAT   Narrative:    Performed at:  [facility] [address] [place], [place]  [id number] Lab Director: Rush Farmer MD, Phone:  [phone]  ROCKY MTN SPOTTED FVR ABS PNL(IGG+IGM)  B. BURGDORFI ANTIBODIES  CBC WITH DIFFERENTIAL/PLATELET  BASIC METABOLIC PANEL    EKG   Radiology No results found.  Procedures Procedures (including critical care time)  Medications Ordered in UC Medications - No data to display  Initial Impression / Assessment and Plan / UC Course  I have reviewed the triage vital signs and the nursing notes.  Pertinent labs & imaging results that were available during my care of the patient were reviewed by me and considered in my medical decision making (see chart for details).     Diarrhea, lightheadedness and fatigue, screening for COVID, concerning given recent tick bite for possible tickborne illness, screening for Lyme and RMSF along with basic labs, initiated on doxycycline, will have patient stop Doxy if blood work negative, continue to monitor symptoms, suspect otherwise symptoms may likely be viral  and continue symptomatic and supportive care rest and fluids.  Discussed strict return precautions. Patient verbalized understanding and is agreeable with plan.  Final Clinical Impressions(s) / UC Diagnoses   Final diagnoses:  Lightheadedness  Diarrhea, unspecified type  Tick bite of back, initial encounter     Discharge Instructions     Screening for tickborne illnesses and checking basic labs-we will call if anything is abnormal, may see results on MyChart Begin doxycycline twice daily for the next week, will stop if lab results negative Rest and drink plenty of fluids Continue antihistamines-daily cetirizine/Zyrtec or loratadine/Claritin in the morning, Benadryl in the evening Please follow-up if not improving or worsening   ED Prescriptions    Medication Sig Dispense Auth. Provider   doxycycline (VIBRAMYCIN) 100 MG capsule Take 1 capsule (100 mg total) by mouth 2 (two) times daily for 7 days. 14 capsule Ziad Maye, [place] C, PA-C     PDMP not reviewed this encounter.   Janith Lima, PA-C 08/17/20 2006

## 2020-08-18 LAB — BASIC METABOLIC PANEL
BUN/Creatinine Ratio: 17 (ref 9–23)
BUN: 11 mg/dL (ref 6–20)
CO2: 20 mmol/L (ref 20–29)
Calcium: 8.9 mg/dL (ref 8.7–10.2)
Chloride: 104 mmol/L (ref 96–106)
Creatinine, Ser: 0.64 mg/dL (ref 0.57–1.00)
Glucose: 178 mg/dL — ABNORMAL HIGH (ref 65–99)
Potassium: 4 mmol/L (ref 3.5–5.2)
Sodium: 136 mmol/L (ref 134–144)
eGFR: 116 mL/min/{1.73_m2} (ref >59)

## 2020-08-18 LAB — CBC WITH DIFFERENTIAL/PLATELET
Basophils Absolute: 0.1 10*3/uL (ref 0.0–0.2)
Basos: 1 %
EOS (ABSOLUTE): 0.2 10*3/uL (ref 0.0–0.4)
Eos: 2 %
Hematocrit: 42.5 % (ref 34.0–46.6)
Hemoglobin: 13.8 g/dL (ref 11.1–15.9)
Immature Grans (Abs): 0.1 10*3/uL (ref 0.0–0.1)
Immature Granulocytes: 1 %
Lymphocytes Absolute: 3.1 10*3/uL (ref 0.7–3.1)
Lymphs: 29 %
MCH: 28.5 pg (ref 26.6–33.0)
MCHC: 32.5 g/dL (ref 31.5–35.7)
MCV: 88 fL (ref 79–97)
Monocytes Absolute: 0.8 10*3/uL (ref 0.1–0.9)
Monocytes: 7 %
Neutrophils Absolute: 6.5 10*3/uL (ref 1.4–7.0)
Neutrophils: 60 %
Platelets: 337 10*3/uL (ref 150–450)
RBC: 4.85 x10E6/uL (ref 3.77–5.28)
RDW: 13.1 % (ref 11.7–15.4)
WBC: 10.7 10*3/uL (ref 3.4–10.8)

## 2020-08-18 LAB — ROCKY MTN SPOTTED FVR ABS PNL(IGG+IGM)
RMSF IgG: NEGATIVE
RMSF IgM: 0.91 index — ABNORMAL HIGH (ref 0.00–0.89)

## 2020-08-19 LAB — SPECIMEN STATUS REPORT

## 2020-08-19 LAB — LYME DISEASE SEROLOGY W/REFLEX: Lyme Total Antibody EIA: NEGATIVE

## 2020-09-30 ENCOUNTER — Other Ambulatory Visit: Payer: Self-pay

## 2020-09-30 ENCOUNTER — Ambulatory Visit (INDEPENDENT_AMBULATORY_CARE_PROVIDER_SITE_OTHER): Payer: BC Managed Care – PPO | Admitting: Obstetrics and Gynecology

## 2020-09-30 ENCOUNTER — Encounter: Payer: Self-pay | Admitting: Obstetrics and Gynecology

## 2020-09-30 ENCOUNTER — Other Ambulatory Visit (HOSPITAL_COMMUNITY)
Admission: RE | Admit: 2020-09-30 | Discharge: 2020-09-30 | Disposition: A | Discharge status: Home / Self Care | Payer: BC Managed Care – PPO | Source: Ambulatory Visit | Attending: Obstetrics and Gynecology | Admitting: Obstetrics and Gynecology

## 2020-09-30 VITALS — BP 118/80 | HR 87 | Wt 231.0 lb

## 2020-09-30 DIAGNOSIS — Z124 Encounter for screening for malignant neoplasm of cervix: Secondary | ICD-10-CM

## 2020-09-30 DIAGNOSIS — N926 Irregular menstruation, unspecified: Secondary | ICD-10-CM

## 2020-09-30 DIAGNOSIS — N898 Other specified noninflammatory disorders of vagina: Secondary | ICD-10-CM | POA: Diagnosis present

## 2020-09-30 NOTE — Progress Notes (Signed)
Obstetrics and Gynecology Visit Return Patient Evaluation  Appointment Date: 09/30/2020  Primary Care Provider: Lennie Obrien  OBGYN Clinic: [facility]  Chief Complaint:  late period, vaginitis  History of Present Illness:  Veronica Obrien is a 40 y.o. with above CC.   Period this past month was 40 days and it just ended and it was shorter (3 vs 7-10 days) and light and not painful   Has had some vaginal itching and tenderness since a little before her last period. Hasn't tried anything OTC  Review of Systems: as noted in the History of Present Illness.  Patient Active Problem List   Diagnosis Date Noted   Pelvic pain 12/09/2019   History of gestational diabetes 03/07/2015   Obesity (BMI 30-39.9) 62/83/6629   Complicated migraine 47/65/4650   Medications:  Veronica Obrien had no medications administered during this visit. Current Outpatient Medications  Medication Sig Dispense Refill   ibuprofen (ADVIL) 100 MG chewable tablet Chew by mouth every 8 (eight) hours as needed.     naproxen (NAPROSYN) 500 MG tablet Take 1 tablet (500 mg total) by mouth 2 (two) times daily with a meal. 30 tablet 0   Omega-3 1000 MG CAPS      triamcinolone cream (KENALOG) 0.1 % Apply 1 application topically 2 (two) times daily. 30 g 2   Turmeric (QC TUMERIC COMPLEX PO) Take by mouth.     acetaminophen (TYLENOL) 500 MG tablet Take 500 mg by mouth every 6 (six) hours as needed. (Patient not taking: Reported on 09/30/2020)     Probiotic Product (PROBIOTIC-10) CHEW      No current facility-administered medications for this visit.    Allergies: is allergic to escitalopram oxalate, novocain [procaine hcl], and venlafaxine hcl er.  Physical Exam:  BP 118/80   Pulse 87   Wt 231 lb (104.8 kg)   LMP 09/27/2020 (Exact Date)   Breastfeeding No   BMI 35.12 kg/m  Body mass index is 35.12 kg/m. General appearance: Well nourished, well developed female in no acute distress.   Abdomen: diffusely non tender to palpation, non distended, and no masses, hernias Neuro/Psych:  Normal mood and affect.    Pelvic exam:  EGBUS, vaginal vault and cervix: within normal limits except for Veronica Obrien cottage cheese like d/c at the introitus   Assessment: pt stable  Plan:  1. Cervical cancer screening Due for pap - Cytology - PAP( [facility])  2. Vagina itching Hold off on diflucan until final swab results are back - Cervicovaginal ancillary only( )  3. Late period No hot flashes, night sweats. I did tell her that with having salpingectomies and only one ovary that she is at risk for earlier menopause but as long as periods are b/w 21-42 days in interval then that's still considered normal. If more or less than this, pt told to let us know.   RTC: PRN  Durene Romans MD Attending Center for [facility])

## 2020-10-05 LAB — CERVICOVAGINAL ANCILLARY ONLY
Bacterial Vaginitis (gardnerella): NEGATIVE
Candida Glabrata: NEGATIVE
Candida Vaginitis: POSITIVE — AB
Chlamydia: NEGATIVE
Comment: NEGATIVE
Comment: NEGATIVE
Comment: NEGATIVE
Comment: NEGATIVE
Comment: NEGATIVE
Comment: NORMAL
Neisseria Gonorrhea: NEGATIVE
Trichomonas: NEGATIVE

## 2020-10-05 LAB — CYTOLOGY - PAP
Comment: NEGATIVE
Diagnosis: NEGATIVE
High risk HPV: NEGATIVE

## 2020-10-08 MED ORDER — FLUCONAZOLE 150 MG PO TABS: 150.0000 mg | ORAL_TABLET | Freq: Once | ORAL | 1 refills | Status: AC

## 2020-10-08 NOTE — Addendum Note (Signed)
Addended by: Aletha Halim on: 10/08/2020 11:23 AM   Modules accepted: Orders

## 2020-12-28 ENCOUNTER — Telehealth: Payer: Self-pay

## 2020-12-28 NOTE — Telephone Encounter (Signed)
Spoke to the pt shes at work she will call tomorrow to set up an appt

## 2021-01-03 ENCOUNTER — Other Ambulatory Visit (HOSPITAL_COMMUNITY)
Admission: RE | Admit: 2021-01-03 | Discharge: 2021-01-03 | Disposition: A | Discharge status: Home / Self Care | Payer: BC Managed Care – PPO | Source: Ambulatory Visit | Attending: Family Medicine | Admitting: Family Medicine

## 2021-01-03 ENCOUNTER — Encounter: Payer: Self-pay | Admitting: Obstetrics and Gynecology

## 2021-01-03 ENCOUNTER — Ambulatory Visit (INDEPENDENT_AMBULATORY_CARE_PROVIDER_SITE_OTHER): Payer: BC Managed Care – PPO

## 2021-01-03 ENCOUNTER — Other Ambulatory Visit: Payer: Self-pay

## 2021-01-03 VITALS — BP 113/77 | HR 81

## 2021-01-03 DIAGNOSIS — N898 Other specified noninflammatory disorders of vagina: Secondary | ICD-10-CM | POA: Insufficient documentation

## 2021-01-03 DIAGNOSIS — R350 Frequency of micturition: Secondary | ICD-10-CM

## 2021-01-03 LAB — POCT URINALYSIS DIPSTICK
Bilirubin, UA: NEGATIVE
Blood, UA: NEGATIVE
Glucose, UA: NEGATIVE
Ketones, UA: NEGATIVE
Leukocytes, UA: NEGATIVE
Nitrite, UA: NEGATIVE
Protein, UA: NEGATIVE
Spec Grav, UA: 1.02 (ref 1.010–1.025)
Urobilinogen, UA: 0.2 E.U./dL
pH, UA: 6 (ref 5.0–8.0)

## 2021-01-03 NOTE — Progress Notes (Signed)
SUBJECTIVE:  40 y.o. female complains of Vaginal odor and frequent urination  for 1 week(s). pelvic pain or fever. UTI symptoms. Denies history of known exposure to STD.  No LMP recorded.  OBJECTIVE:  She appears well, afebrile. Urine dipstick: negative for all components.  ASSESSMENT:  Frequent Urination  Vaginal Odor   PLAN:   BVAG, CVAG, urine cx probe sent to lab. Treatment: To be determined once lab results are received ROV prn if symptoms persist or worsen.

## 2021-01-04 LAB — CERVICOVAGINAL ANCILLARY ONLY
Bacterial Vaginitis (gardnerella): NEGATIVE
Candida Glabrata: NEGATIVE
Candida Vaginitis: NEGATIVE
Comment: NEGATIVE
Comment: NEGATIVE
Comment: NEGATIVE

## 2021-01-04 NOTE — Progress Notes (Signed)
Attestation of Attending Supervision of clinical support staff: I agree with the care provided to this patient and was available for any consultation.  I have reviewed the CMA's note and chart, and I agree with the management and plan.  Caren Macadam, MD, MPH, ABFM Attending Physician Center for [place]

## 2021-01-06 LAB — URINE CULTURE

## 2021-01-14 ENCOUNTER — Emergency Department (HOSPITAL_BASED_OUTPATIENT_CLINIC_OR_DEPARTMENT_OTHER)
Admission: EM | Admit: 2021-01-14 | Discharge: 2021-01-14 | Disposition: A | Discharge status: Home / Self Care | Payer: No Typology Code available for payment source | Attending: Emergency Medicine | Admitting: Emergency Medicine

## 2021-01-14 ENCOUNTER — Other Ambulatory Visit: Payer: Self-pay

## 2021-01-14 ENCOUNTER — Encounter (HOSPITAL_BASED_OUTPATIENT_CLINIC_OR_DEPARTMENT_OTHER): Payer: Self-pay | Admitting: *Deleted

## 2021-01-14 DIAGNOSIS — Z87891 Personal history of nicotine dependence: Secondary | ICD-10-CM | POA: Diagnosis not present

## 2021-01-14 DIAGNOSIS — H05012 Cellulitis of left orbit: Secondary | ICD-10-CM | POA: Insufficient documentation

## 2021-01-14 DIAGNOSIS — L03213 Periorbital cellulitis: Secondary | ICD-10-CM

## 2021-01-14 DIAGNOSIS — H5789 Other specified disorders of eye and adnexa: Secondary | ICD-10-CM | POA: Diagnosis present

## 2021-01-14 DIAGNOSIS — H1032 Unspecified acute conjunctivitis, left eye: Secondary | ICD-10-CM | POA: Diagnosis not present

## 2021-01-14 MED ORDER — CLINDAMYCIN HCL 150 MG PO CAPS
300.0000 mg | ORAL_CAPSULE | Freq: Once | ORAL | Status: AC
  Administered 2021-01-14: 300 mg via ORAL
  Filled 2021-01-14: qty 2

## 2021-01-14 MED ORDER — FLUORESCEIN SODIUM 1 MG OP STRP
1.0000 | ORAL_STRIP | Freq: Once | OPHTHALMIC | Status: AC
  Administered 2021-01-14: 1 via OPHTHALMIC
  Filled 2021-01-14: qty 1

## 2021-01-14 MED ORDER — ERYTHROMYCIN 5 MG/GM OP OINT: TOPICAL_OINTMENT | OPHTHALMIC | 0 refills | Status: DC

## 2021-01-14 MED ORDER — ERYTHROMYCIN 5 MG/GM OP OINT
TOPICAL_OINTMENT | Freq: Four times a day (QID) | OPHTHALMIC | Status: DC
  Administered 2021-01-14: 1 via OPHTHALMIC
  Filled 2021-01-14: qty 3.5

## 2021-01-14 MED ORDER — TETRACAINE HCL 0.5 % OP SOLN
2.0000 [drp] | Freq: Once | OPHTHALMIC | Status: AC
  Administered 2021-01-14: 2 [drp] via OPHTHALMIC
  Filled 2021-01-14: qty 4

## 2021-01-14 MED ORDER — CLINDAMYCIN HCL 300 MG PO CAPS: 300.0000 mg | ORAL_CAPSULE | Freq: Three times a day (TID) | ORAL | 0 refills | Status: AC

## 2021-01-14 NOTE — Discharge Instructions (Addendum)
Take antibiotic as prescribed.  Follow-up with ophthalmology.  Return if symptoms worsen.  I have sent oral antibiotic to your pharmacy, I have given you a written prescription for eyedrop ointment in case you need more.  Take eye ointment for 7 days and use every 6 hours.
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
## 2021-01-14 NOTE — ED Provider Notes (Signed)
[facility] EMERGENCY DEPARTMENT Provider Note   CSN: [id number] Arrival date & time: 01/14/21  1611     History Chief Complaint  Patient presents with   Eye Problem    Veronica Obrien is a 40 y.o. female.  The history is provided by the patient.  Eye Problem Location:  Left eye Quality:  Aching Severity:  Mild Onset quality:  Gradual Timing:  Constant Progression:  Worsening Chronicity:  New Context comment:  Possibly got some spit and dirt in her eye yesterday Relieved by:  Nothing Worsened by:  Nothing Associated symptoms: blurred vision, discharge, inflammation, itching and tearing   Associated symptoms: no crusting, no decreased vision, no double vision, no facial rash, no headaches, no nausea, no numbness, no photophobia, no redness, no scotomas, no swelling, no tingling, no vomiting and no weakness       Past Medical History:  Diagnosis Date   Anxiety    Depression    Right ovarian cyst 12/24/2014   5 cm right ovarian cyst    Patient Active Problem List   Diagnosis Date Noted   Pelvic pain 12/09/2019   History of gestational diabetes 03/07/2015   Obesity (BMI 30-39.9) 35/57/3220   Complicated migraine 25/42/7062    Past Surgical History:  Procedure Laterality Date   LAPAROSCOPIC SALPINGO OOPHERECTOMY N/A 05/05/2020   Procedure: RIGHT LAPAROSCOPIC SALPINGO OOPHORECTOMY, LEFT LAPAROSCOPIC SALPINGECTOMY;  Surgeon: Veronica Jude, MD;  Location: [facility];  Service: Gynecology;  Laterality: N/A;   WISDOM TOOTH EXTRACTION       OB History     Gravida  2   Para  2   Term  1   Preterm  1   AB  0   Living  2      SAB  0   IAB  0   Ectopic  0   Multiple  0   Live Births  2           Family History  Problem Relation Age of Onset   Cancer Sister        lymphoma non-Hodgkins   Arthritis Father    Heart disease Maternal Grandfather    Diabetes Maternal Grandfather    Diabetes Paternal Grandmother    Breast  cancer Paternal Grandmother        49   Diabetes Paternal Grandfather     Social History   Tobacco Use   Smoking status: Former    Packs/day: 0.00    Types: Cigarettes    Quit date: 06/06/2014    Years since quitting: 6.6   Smokeless tobacco: Never  Vaping Use   Vaping Use: Never used  Substance Use Topics   Alcohol use: No    Comment: NOT SINCE PREGNANCY   Drug use: Yes    Types: Marijuana    Comment: 1x per year    Home Medications Prior to Admission medications   Medication Sig Start Date End Date Taking? Authorizing Provider  clindamycin (CLEOCIN) 300 MG capsule Take 1 capsule (300 mg total) by mouth 3 (three) times daily for 10 days. 01/14/21 01/24/21 Yes Ronita Hargreaves, DO  erythromycin ophthalmic ointment Place a 1/2 inch ribbon of ointment into the lower eyelid. 01/14/21  Yes Alexcis Bicking, DO  triamcinolone cream (KENALOG) 0.1 % Apply 1 application topically 2 (two) times daily. 12/14/16  Yes Barnet Glasgow, NP  clobetasol cream (TEMOVATE) 0.05 % APPLY SPARINGLY TO AFFECTED AREA TWICE A DAY 09/01/20   [provider]  ibuprofen (ADVIL) 100 MG chewable tablet Chew by mouth every 8 (eight) hours as needed.    [provider]  naproxen (NAPROSYN) 500 MG tablet Take 1 tablet (500 mg total) by mouth 2 (two) times daily with a meal. Patient not taking: No sig reported 05/21/20   Jaynee Eagles, PA-C  Omega-3 1000 MG CAPS     [provider]  Turmeric (QC TUMERIC COMPLEX PO) Take by mouth. Patient not taking: No sig reported    [provider]    Allergies    Escitalopram oxalate, Novocain [procaine hcl], and Venlafaxine hcl er  Review of Systems   Review of Systems  Eyes:  Positive for blurred vision, pain, discharge and itching. Negative for double vision, photophobia, redness and visual disturbance.  Gastrointestinal:  Negative for nausea and vomiting.  Neurological:  Negative for tingling, weakness, numbness and headaches.    Physical Exam Updated Vital Signs BP 127/90 (BP Location: Right Arm)   Pulse 97   Temp 99.3 F (37.4 C) (Oral)   Resp 20   Ht 5\' 8"  (1.727 m)   Wt 108.9 kg   LMP 01/10/2021   SpO2 100%   BMI 36.49 kg/m   Physical Exam Constitutional:      General: She is not in acute distress.    Appearance: She is not ill-appearing.  HENT:     Head: Normocephalic and atraumatic.     Nose: Nose normal.     Mouth/Throat:     Mouth: Mucous membranes are moist.  Eyes:     Extraocular Movements: Extraocular movements intact.     Pupils: Pupils are equal, round, and reactive to light.     Comments: There is swelling around the upper eyelid and lower eyelid with no fluorescein uptake in the left eye.  Extraocular movements are intact with no pain.  Neurological:     Mental Status: She is alert.    ED Results / Procedures / Treatments   Labs (all labs ordered are listed, but only abnormal results are displayed) Labs Reviewed - No data to display  EKG None  Radiology No results found.  Procedures Procedures   Medications Ordered in ED Medications  erythromycin ophthalmic ointment (1 application Left Eye Given 01/14/21 1811)  fluorescein ophthalmic strip 1 strip (1 strip Left Eye Given by Other 01/14/21 1740)  tetracaine (PONTOCAINE) 0.5 % ophthalmic solution 2 drop (2 drops Left Eye Given by Other 01/14/21 1740)  clindamycin (CLEOCIN) capsule 300 mg (300 mg Oral Given 01/14/21 1811)    ED Course  I have reviewed the triage vital signs and the nursing notes.  Pertinent labs & imaging results that were available during my care of the patient were reviewed by me and considered in my medical decision making (see chart for details).    MDM Rules/Calculators/A&P                           [name] is here with eye infection.  Overall suspect possibly conjunctivitis/preseptal cellulitis.  Will treat with oral and topical antibiotics.  Visual acuity within normal limits.  No  concern for orbital cellulitis.  Recommend follow-up with ophthalmology.  Normal vitals.  No fever.  Overall appears to be a mild infection but will treat with both oral and topical antibiotics.  This chart was dictated using voice recognition software.  Despite best efforts to proofread,  errors can occur which can change the documentation meaning.   Final  Clinical Impression(s) / ED Diagnoses Final diagnoses:  Acute conjunctivitis of left eye, unspecified acute conjunctivitis type  Preseptal cellulitis of left eye    Rx / DC Orders ED Discharge Orders          Ordered    clindamycin (CLEOCIN) 300 MG capsule  3 times daily        01/14/21 1809    erythromycin ophthalmic ointment        01/14/21 [place], Elizabeth, DO 01/14/21 1812

## 2021-07-22 IMAGING — US US TRANSVAGINAL NON-OB
1 series · 14 of 25 positions shown · non-contrast
Comparison: Prior ultrasound from 12/29/2019

CLINICAL DATA: Follow-up examination for ovarian cyst.

EXAM:
ULTRASOUND PELVIS TRANSVAGINAL
TECHNIQUE: Transvaginal ultrasound examination of the pelvis was performed
including evaluation of the uterus, ovaries, adnexal regions, and
pelvic cul-de-sac.

[Series 1: us transvaginal non-ob · 14 of 85 slices shown]
[im 1/85]
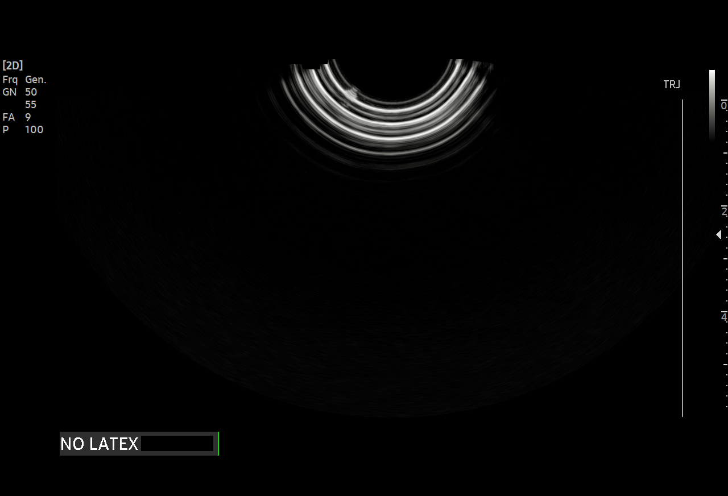
[im 8/85]
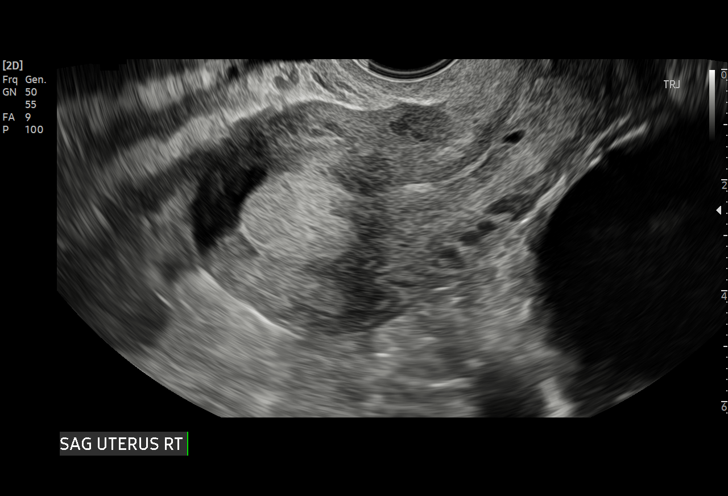
[im 15/85]
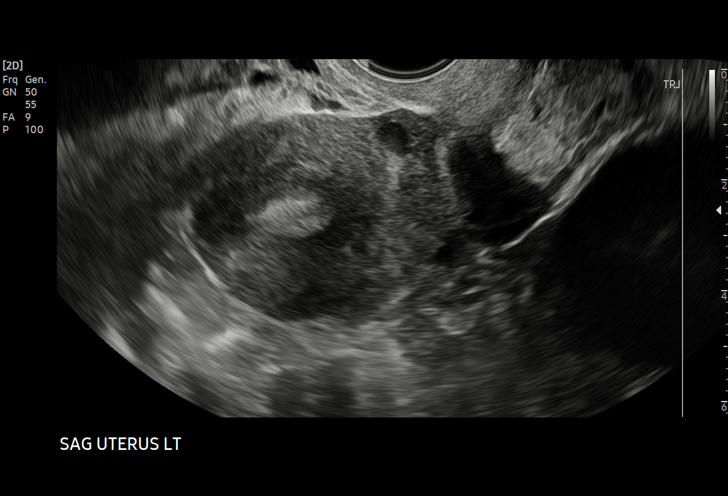
[im 22/85]
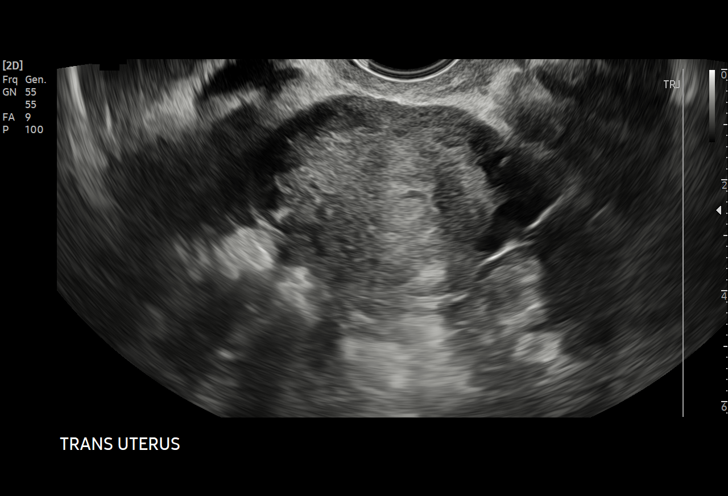
[im 29/85]
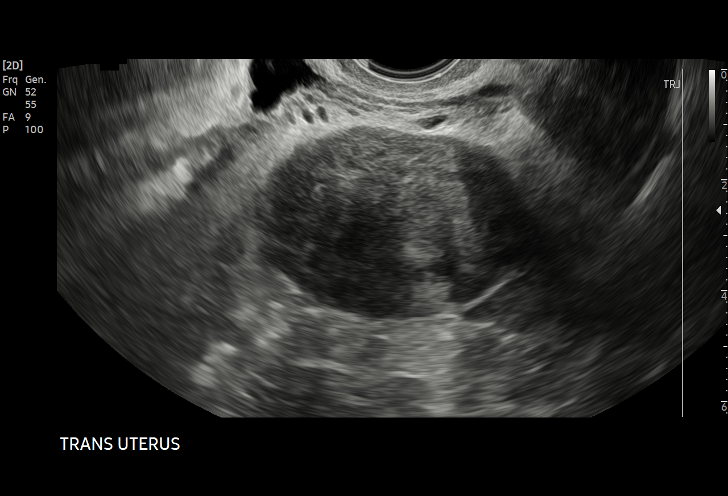
[im 32/85]
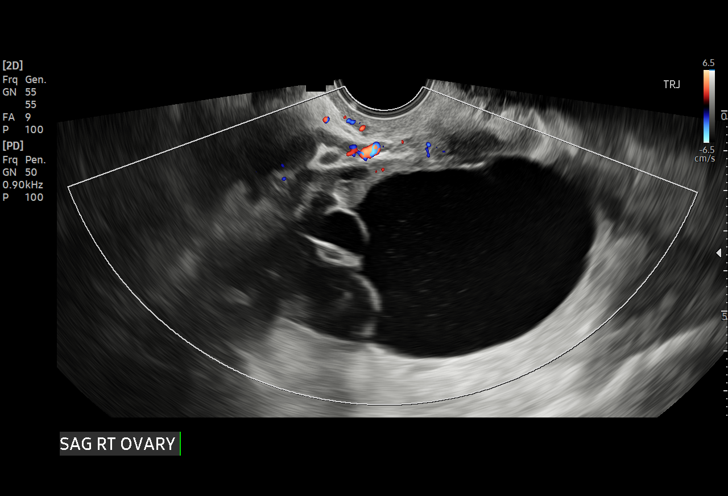
[im 39/85]
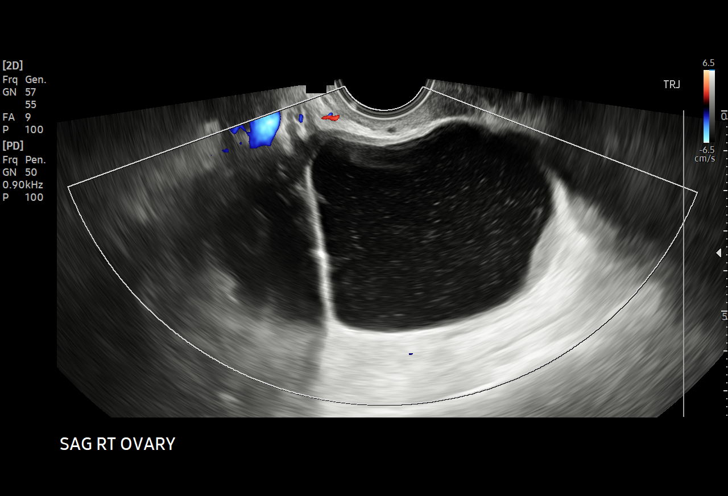
[im 46/85]
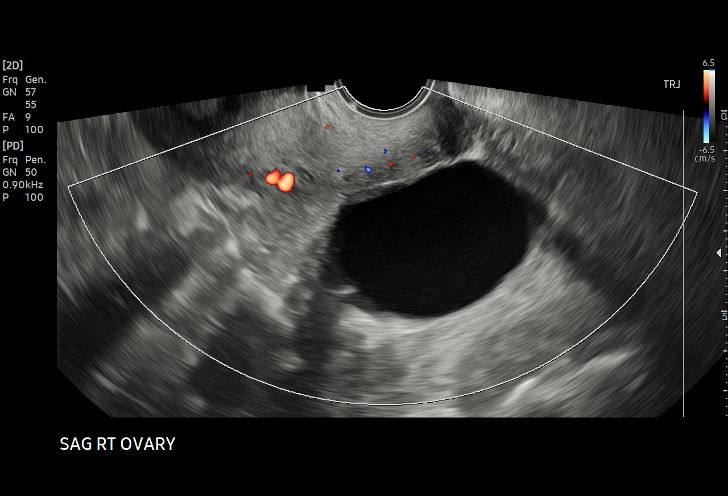
[im 53/85]
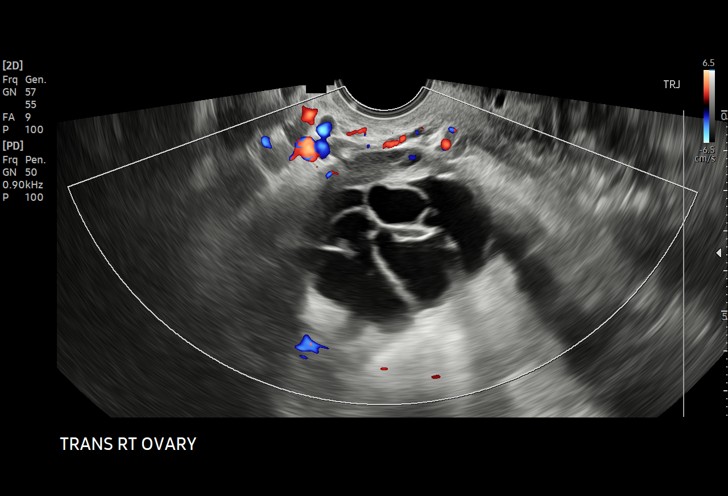
[im 57/85]
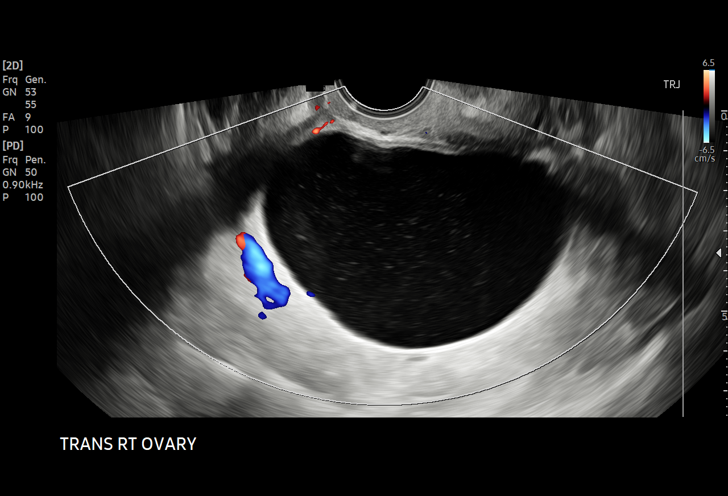
[im 64/85]
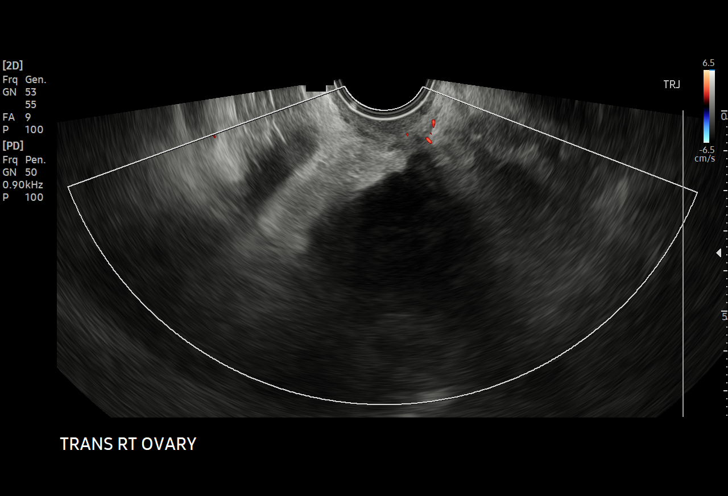
[im 71/85]
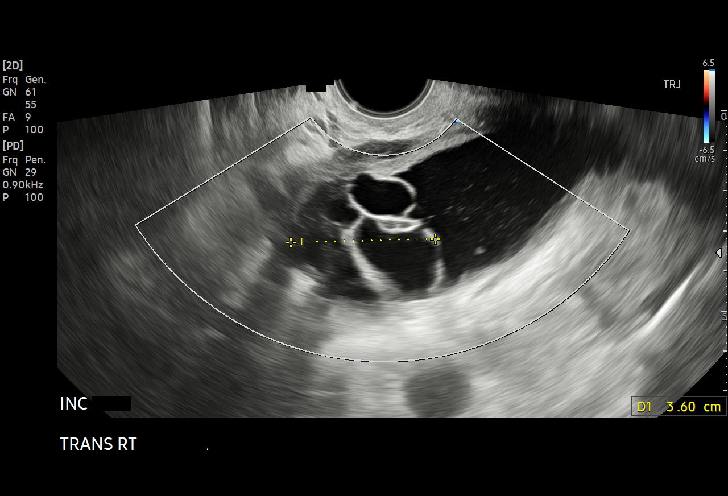
[im 78/85]
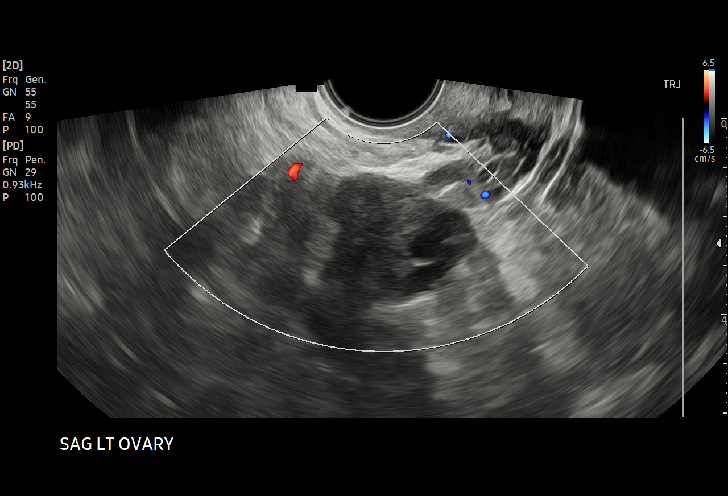
[im 85/85]
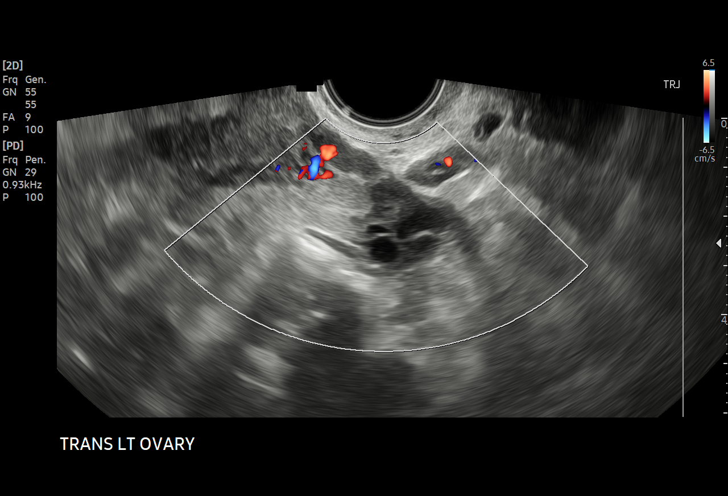

[14 of 25 positions shown; findings below may reference images not displayed]

FINDINGS: Uterus

Measurements: 8.6 x 4.2 x 5.6 cm = volume: 106.2 mL. Uterus is
anteverted. No discrete fibroid or other mass.

Endometrium

Endometrial stripe measures up to 14.9 mm in thickness at the level
of the uterine fundus. No associated vascularity. No appreciable
focal lesion.

Right ovary

Measurements: 8.0 x 4.9 x 2.1 cm = volume: 43.5 mL. Complex cystic
mass again seen within the right adnexa, likely ovarian in origin.
Lesion measures 7.9 x 7.4 x 5.0 cm, little interval change from
prior (previously 7.7 x 7.3 x 5.2 cm). Homogeneous low-level
internal echoes seen throughout the cystic component, with a few
possible internal septations. There appears to be some solid mural
nodularity along the periphery of this lesion. No appreciable
internal blood flow. An adjacent complex cystic lesion positioned
adjacent to this mass and superior to the right ovary measures 4.2 x
2.9 x 3.6 cm. Unclear whether this reflects a component of the
cystic mass and/or adjacent hydrosalpinx. Overall, appearance is
relatively unchanged from prior.

Left ovary

Measurements: 3.5 x 1.9 x 3.1 cm = volume: 10.7 mL. Normal
appearance/no adnexal mass.

Other findings:  No free fluid within the pelvis.

Measurements: 8.6 x 4.2 x 5.6 cm = volume: 106.2 mL. No fibroids or
other mass visualized.
IMPRESSION: 1. No significant interval change in size of complex cystic right
adnexal mass measuring up to 7.9 cm on today's exam. Evidence for
internal septations with mural nodularity. Possible adjacent
hydrosalpinx, also unchanged. While this lesion is indeterminate,
findings could reflect a cystic ovarian neoplasm. Gynecologic
referral for surgical consultation and further evaluation
recommended. Additionally, further assessment with dedicated pelvic
MRI, with and without contrast, could be performed for further
evaluation as clinically warranted.
2. No free fluid within the pelvis.
3. Normal sonographic appearance of the uterus and left ovary.

## 2022-04-29 ENCOUNTER — Emergency Department (HOSPITAL_BASED_OUTPATIENT_CLINIC_OR_DEPARTMENT_OTHER): Payer: BC Managed Care – PPO

## 2022-04-29 ENCOUNTER — Other Ambulatory Visit: Payer: Self-pay

## 2022-04-29 DIAGNOSIS — M25552 Pain in left hip: Secondary | ICD-10-CM | POA: Diagnosis not present

## 2022-04-29 DIAGNOSIS — W19XXXA Unspecified fall, initial encounter: Secondary | ICD-10-CM | POA: Diagnosis not present

## 2022-04-29 DIAGNOSIS — S80812A Abrasion, left lower leg, initial encounter: Secondary | ICD-10-CM | POA: Insufficient documentation

## 2022-04-29 DIAGNOSIS — S86912A Strain of unspecified muscle(s) and tendon(s) at lower leg level, left leg, initial encounter: Secondary | ICD-10-CM | POA: Insufficient documentation

## 2022-04-29 DIAGNOSIS — M25511 Pain in right shoulder: Secondary | ICD-10-CM | POA: Insufficient documentation

## 2022-04-29 DIAGNOSIS — S8992XA Unspecified injury of left lower leg, initial encounter: Secondary | ICD-10-CM | POA: Diagnosis present

## 2022-04-29 NOTE — ED Triage Notes (Signed)
Patient presents to ED via POV from skating rink post fall. Endorses right shoulder pain and left hip pain. Positive head strike. Denies LOC. Not on blood thinners.

## 2022-04-30 ENCOUNTER — Emergency Department (HOSPITAL_BASED_OUTPATIENT_CLINIC_OR_DEPARTMENT_OTHER)
Admission: EM | Admit: 2022-04-30 | Discharge: 2022-04-30 | Disposition: A | Discharge status: Home / Self Care | Payer: BC Managed Care – PPO | Attending: Emergency Medicine | Admitting: Emergency Medicine

## 2022-04-30 DIAGNOSIS — T148XXA Other injury of unspecified body region, initial encounter: Secondary | ICD-10-CM

## 2022-04-30 DIAGNOSIS — W19XXXA Unspecified fall, initial encounter: Secondary | ICD-10-CM

## 2022-04-30 DIAGNOSIS — M25511 Pain in right shoulder: Secondary | ICD-10-CM

## 2022-04-30 DIAGNOSIS — S80812A Abrasion, left lower leg, initial encounter: Secondary | ICD-10-CM | POA: Diagnosis not present

## 2022-04-30 MED ORDER — KETOROLAC TROMETHAMINE 60 MG/2ML IM SOLN
60.0000 mg | Freq: Once | INTRAMUSCULAR | Status: AC
  Administered 2022-04-30: 60 mg via INTRAMUSCULAR
  Filled 2022-04-30: qty 2

## 2022-04-30 MED ORDER — DICLOFENAC SODIUM ER 100 MG PO TB24: 100.0000 mg | ORAL_TABLET | Freq: Every day | ORAL | 0 refills | Status: DC

## 2022-04-30 MED ORDER — METAXALONE 800 MG PO TABS: 800.0000 mg | ORAL_TABLET | Freq: Three times a day (TID) | ORAL | 0 refills | Status: DC

## 2022-04-30 MED ORDER — LIDOCAINE 5 % EX PTCH: 3.0000 | MEDICATED_PATCH | CUTANEOUS | Status: DC

## 2022-04-30 MED ORDER — LIDOCAINE 5 % EX PTCH: 1.0000 | MEDICATED_PATCH | CUTANEOUS | 0 refills | Status: DC

## 2022-04-30 NOTE — ED Notes (Signed)
Pt fell at the rollerskating rink today and now "hurts all over."  EDP bedside.  Right shoulder, left hip and right knee all hurt.

## 2022-04-30 NOTE — ED Provider Notes (Signed)
EMERGENCY DEPARTMENT AT [facility] Provider Note   CSN: [id number] Arrival date & time: 04/29/22  1631     History  Chief Complaint  Patient presents with   Hip Pain    Veronica Obrien is a 42 y.o. female.  The history is provided by the patient.  Fall This is a new problem. The current episode started 12 to 24 hours ago. The problem occurs rarely. The problem has been resolved. Pertinent negatives include no chest pain, no abdominal pain, no headaches and no shortness of breath. Nothing aggravates the symptoms. Nothing relieves the symptoms. The treatment provided no relief.  Patient fell while roller skating and reports she struck right shoulder and did a split and stretched left posterior hip muscles and scraped right knee cap.       Home Medications Prior to Admission medications   Medication Sig Start Date End Date Taking? Authorizing Provider  Diclofenac Sodium CR 100 MG 24 hr tablet Take 1 tablet (100 mg total) by mouth daily. 04/30/22  Yes Honor Frison, MD  lidocaine (LIDODERM) 5 % Place 1 patch onto the skin daily. Remove & Discard patch within 12 hours or as directed by MD 04/30/22  Yes Lacinda Curvin, MD  metaxalone (SKELAXIN) 800 MG tablet Take 1 tablet (800 mg total) by mouth 3 (three) times daily. 04/30/22  Yes Jibran Crookshanks, MD  clobetasol cream (TEMOVATE) 0.05 % APPLY SPARINGLY TO AFFECTED AREA TWICE A DAY 09/01/20   [provider]  erythromycin ophthalmic ointment Place a 1/2 inch ribbon of ointment into the lower eyelid. 01/14/21   Curatolo, Adam, DO  ibuprofen (ADVIL) 100 MG chewable tablet Chew by mouth every 8 (eight) hours as needed.    [provider]  naproxen (NAPROSYN) 500 MG tablet Take 1 tablet (500 mg total) by mouth 2 (two) times daily with a meal. Patient not taking: No sig reported 05/21/20   Jaynee Eagles, PA-C  Omega-3 1000 MG CAPS     [provider]  triamcinolone cream (KENALOG) 0.1 % Apply 1  application topically 2 (two) times daily. 12/14/16   Barnet Glasgow, NP  Turmeric (QC TUMERIC COMPLEX PO) Take by mouth. Patient not taking: No sig reported    [provider]      Allergies    Escitalopram oxalate, Novocain [procaine hcl], and Venlafaxine hcl er    Review of Systems   Review of Systems  Respiratory:  Negative for shortness of breath, wheezing and stridor.   Cardiovascular:  Negative for chest pain.  Gastrointestinal:  Negative for abdominal pain.  Musculoskeletal:  Positive for arthralgias. Negative for back pain.  Neurological:  Negative for headaches.  All other systems reviewed and are negative.   Physical Exam Updated Vital Signs BP (!) 130/91   Pulse 85   Temp 98.5 F (36.9 C) (Oral)   Resp 16   Ht '5\' 7"'$  (1.702 m)   Wt 104.3 kg   LMP 04/21/2022 (Approximate)   SpO2 98%   BMI 36.02 kg/m  Physical Exam Vitals and nursing note reviewed. Exam conducted with a chaperone present.  Constitutional:      General: She is not in acute distress.    Appearance: Normal appearance. She is well-developed.  HENT:     Head: Normocephalic and atraumatic.     Right Ear: Tympanic membrane normal.     Left Ear: Tympanic membrane normal.     Nose: Nose normal.  Eyes:     Extraocular Movements: Extraocular  movements intact.     Pupils: Pupils are equal, round, and reactive to light.  Cardiovascular:     Rate and Rhythm: Normal rate and regular rhythm.     Pulses: Normal pulses.     Heart sounds: Normal heart sounds.  Pulmonary:     Effort: Pulmonary effort is normal. No respiratory distress.     Breath sounds: Normal breath sounds.  Abdominal:     General: Bowel sounds are normal. There is no distension.     Palpations: Abdomen is soft.     Tenderness: There is no abdominal tenderness. There is no guarding or rebound.  Genitourinary:    Vagina: No vaginal discharge.  Musculoskeletal:        General: Normal range of motion.     Right shoulder:  Normal. No swelling, deformity, effusion, laceration or crepitus. Normal strength. Normal pulse.     Right upper arm: Normal.     Right elbow: Normal.     Right forearm: Normal.     Left forearm: Normal.     Right wrist: Normal. No snuff box tenderness. Normal pulse.     Right hand: Normal.     Cervical back: Normal range of motion and neck supple.     Right hip: Normal.     Left hip: Normal.     Left upper leg: No edema, deformity, lacerations or bony tenderness.     Right knee: No LCL laxity, MCL laxity, ACL laxity or PCL laxity. Normal alignment, normal meniscus and normal patellar mobility.     Instability Tests: Anterior drawer test negative. Posterior drawer test negative. Medial McMurray test negative and lateral McMurray test negative.     Left knee: Normal. No LCL laxity, MCL laxity, ACL laxity or PCL laxity.Normal alignment, normal meniscus and normal patellar mobility.     Instability Tests: Anterior drawer test negative. Posterior drawer test negative. Medial McMurray test negative and lateral McMurray test negative.     Right lower leg: Normal.     Left lower leg: Normal.     Right ankle: Normal.     Right Achilles Tendon: Normal.     Left ankle: Normal.     Left Achilles Tendon: Normal.     Comments: Negative NEERs test of the right shoulder,  intact biceps and triceps tendons, no winging of the R scapula 1 cm abrasion, round over R patella.  No patella alta or baja of either knee  Skin:    General: Skin is dry.     Capillary Refill: Capillary refill takes less than 2 seconds.     Findings: No erythema or rash.  Neurological:     General: No focal deficit present.     Mental Status: She is alert.     Deep Tendon Reflexes: Reflexes normal.     Comments: 5/5 LLE strength, intact quadriceps tendon and intact hamstring tendon, FROM of the LLE the hamstring muscle is intact on palpation  Psychiatric:        Mood and Affect: Mood normal.     ED Results / Procedures /  Treatments   Labs (all labs ordered are listed, but only abnormal results are displayed) Labs Reviewed - No data to display  EKG None  Radiology DG FEMUR 1V LEFT  Result Date: 04/29/2022 CLINICAL DATA:  Pain after fall EXAM: LEFT FEMUR 1 VIEW COMPARISON:  None Available. FINDINGS: There is no evidence of fracture or other focal bone lesions. Soft tissues are unremarkable. IMPRESSION: Negative. Electronically Signed  By: Dorise Bullion III M.D.   On: 04/29/2022 17:39   DG Hip Unilat W or Wo Pelvis 2-3 Views Left  Result Date: 04/29/2022 CLINICAL DATA:  Pain after fall EXAM: DG HIP (WITH OR WITHOUT PELVIS) 2-3V LEFT COMPARISON:  None Available. FINDINGS: There is no evidence of hip fracture or dislocation. There is no evidence of arthropathy or other focal bone abnormality. IMPRESSION: Negative. Electronically Signed   By: Dorise Bullion III M.D.   On: 04/29/2022 17:37   DG Shoulder Right  Result Date: 04/29/2022 CLINICAL DATA:  Pain after fall EXAM: RIGHT SHOULDER - 2+ VIEW COMPARISON:  None Available. FINDINGS: There is no evidence of fracture or dislocation. There is no evidence of arthropathy or other focal bone abnormality. Soft tissues are unremarkable. IMPRESSION: Negative. Electronically Signed   By: Dorise Bullion III M.D.   On: 04/29/2022 17:36    Procedures Procedures    Medications Ordered in ED Medications  lidocaine (LIDODERM) 5 % 3 patch (has no administration in time range)  ketorolac (TORADOL) injection 60 mg (60 mg Intramuscular Given 04/30/22 0213)    ED Course/ Medical Decision Making/ A&P                             Medical Decision Making Fell roller skating this afternoon   Problems Addressed: Abrasion:    Details: Wound care provided thoroughly in the ED Acute pain of right shoulder:    Details: Voltaren, skelaxin, referral to orthopedics and note for work  Muscle strain:    Details: Voltaren, skelaxin ice therapy and referral to orthopedics as  well as note for work   Amount and/or Complexity of Data Reviewed Radiology: ordered and independent interpretation performed.    Details: Negative hip and femur xray   Risk Prescription drug management. Risk Details: Well appearing. FROM of the LLE.  Hamstring tendons are intact FROM of the LLE.  This is likely a muscle strain.  Ice, and muscle relaxants and referral to orthopedics and PMD for ongoing care.   R shoulder: negative neers test of the right shoulder 5/5 strength, no winging of the right scapula. Likely contusion from fall onto shoulder.  Voltaren skelaxin and referral to orthopedics.  Stable for discharge.  Strict return.  Note for work provided.      Final Clinical Impression(s) / ED Diagnoses Final diagnoses:  Fall, initial encounter  Abrasion  Muscle strain  Acute pain of right shoulder   Return for intractable cough, coughing up blood, fevers > 100.4 unrelieved by medication, shortness of breath, intractable vomiting, chest pain, shortness of breath, weakness, numbness, changes in speech, facial asymmetry, abdominal pain, passing out, Inability to tolerate liquids or food, cough, altered mental status or any concerns. No signs of systemic illness or infection. The patient is nontoxic-appearing on exam and vital signs are within normal limits.  I have reviewed the triage vital signs and the nursing notes. Pertinent labs & imaging results that were available during my care of the patient were reviewed by me and considered in my medical decision making (see chart for details). After history, exam, and medical workup I feel the patient has been appropriately medically screened and is safe for discharge home. Pertinent diagnoses were discussed with the patient. Patient was given return precautions. Rx / DC Orders ED Discharge Orders          Ordered    Diclofenac Sodium CR 100 MG 24 hr tablet  Daily        04/30/22 0212    metaxalone (SKELAXIN) 800 MG tablet  3 times daily         04/30/22 0212    lidocaine (LIDODERM) 5 %  Every 24 hours        04/30/22 0212              Shenicka Sunderlin, MD 04/30/22 1840

## 2022-06-27 ENCOUNTER — Other Ambulatory Visit: Payer: Self-pay

## 2022-06-27 ENCOUNTER — Encounter (HOSPITAL_BASED_OUTPATIENT_CLINIC_OR_DEPARTMENT_OTHER): Payer: Self-pay | Admitting: Orthopaedic Surgery

## 2022-07-05 ENCOUNTER — Encounter (HOSPITAL_BASED_OUTPATIENT_CLINIC_OR_DEPARTMENT_OTHER): Payer: Self-pay | Admitting: Orthopaedic Surgery

## 2022-07-05 NOTE — Discharge Instructions (Signed)
Ophelia Charter MD, MPH Noemi Chapel, PA-C [place] [address] [phone] (tel)   [phone] (fax)   POST-OPERATIVE INSTRUCTIONS - SHOULDER ARTHROSCOPY  WOUND CARE You may remove the Operative Dressing on Post-Op Day #3 (72hrs after surgery).   Alternatively if you would like you can leave dressing on until follow-up if within 7-8 days but keep it dry. Leave steri-strips in place until they fall off on their own, usually 2 weeks postop. There may be a small amount of fluid/bleeding leaking at the surgical site.  This is normal; the shoulder is filled with fluid during the procedure and can leak for 24-48hrs after surgery.  You may change/reinforce the bandage as needed.  Use the Cryocuff or Ice as often as possible for the first 7 days, then as needed for pain relief. Always keep a towel, ACE wrap or other barrier between the cooling unit and your skin.  You may shower on Post-Op Day #3. Gently pat the area dry.  Do not soak the shoulder in water or submerge it.  Keep incisions as dry as possible. Do not go swimming in the pool or ocean until 4 weeks after surgery or when otherwise instructed.    EXERCISES Wear the sling at all times  You may remove the sling for showering, but keep the arm across the chest or in a secondary sling.     It is normal for your fingers/hand to become more swollen after surgery and discolored from bruising.   This will resolve over the first few weeks usually after surgery. Please continue to ambulate and do not stay sitting or lying for too long.  Perform foot and wrist pumps to assist in circulation.  PHYSICAL THERAPY - You will begin physical therapy soon after surgery (unless otherwise specified) - Please call to set up an appointment, if you do not already have one  - Let our office if there are any issues with scheduling your therapy  - You have a physical therapy appointment scheduled at [facility] (across  the hall from our office) on April 8th at 10:15   [facility] ([place]) The anesthesia team may have performed a nerve block for you this is a great tool used to minimize pain.   The block may start wearing off overnight (between 8-24 hours postop) When the block wears off, your pain may go from nearly zero to the pain you would have had postop without the block. This is an abrupt transition but nothing dangerous is happening.   This can be a challenging period but utilize your as needed pain medications to try and manage this period. We suggest you use the pain medication the first night prior to going to bed, to ease this transition.  You may take an extra dose of narcotic when this happens if needed  POST-OP MEDICATIONS- Multimodal approach to pain control In general your pain will be controlled with a combination of substances.  Prescriptions unless otherwise discussed are electronically sent to your pharmacy.  This is a carefully made plan we use to minimize narcotic use.     Celebrex - Anti-inflammatory medication taken on a scheduled basis Acetaminophen - Non-narcotic pain medicine taken on a scheduled basis  Oxycodone - This is a strong narcotic, to be used only on an "as needed" basis for SEVERE pain. Zofran - take as needed for nausea   FOLLOW-UP If you develop a Fever (?101.5), Redness or Drainage from the surgical incision  site, please call our office to arrange for an evaluation. Please call the office to schedule a follow-up appointment for your first post-operative appointment, 7-10 days post-operatively.    HELPFUL INFORMATION   You may be more comfortable sleeping in a semi-seated position the first few nights following surgery.  Keep a pillow propped under the elbow and forearm for comfort.  If you have a recliner type of chair it might be beneficial.  If not that is fine too, but it would be helpful to sleep propped up with pillows behind your operated  shoulder as well under your elbow and forearm.  This will reduce pulling on the suture lines.  When dressing, put your operative arm in the sleeve first.  When getting undressed, take your operative arm out last.  Loose fitting, button-down shirts are recommended.  Often in the first days after surgery you may be more comfortable keeping your operative arm under your shirt and not through the sleeve.  You may return to work/school in the next couple of days when you feel up to it.  Desk work and typing in the sling is fine.  We suggest you use the pain medication the first night prior to going to bed, in order to ease any pain when the anesthesia wears off. You should avoid taking pain medications on an empty stomach as it will make you nauseous.  You should wean off your narcotic medicines as soon as you are able.  Most patients will be off narcotics before their first postop appointment.   Do not drink alcoholic beverages or take illicit drugs when taking pain medications.  It is against the law to drive while taking narcotics.  In some states it is against the law to drive while your arm is in a sling.   Pain medication may make you constipated.  Below are a few solutions to try in this order: Decrease the amount of pain medication if you aren't having pain. Drink lots of decaffeinated fluids. Drink prune juice and/or eat dried prunes  If the first 3 don't work start with additional solutions Take Colace - an over-the-counter stool softener Take Senokot - an over-the-counter laxative Take Miralax - a stronger over-the-counter laxative  For more information including helpful videos and documents visit our website:   [email or web address]

## 2022-07-05 NOTE — H&P (Signed)
PREOPERATIVE H&P  Chief Complaint: RIGHT SHOULDER ROTATOR CUFF TEAR  HPI: Veronica Obrien is a 42 y.o. female who is scheduled for, Procedure(s): SHOULDER ARTHROSCOPY WITH ROTATOR CUFF REPAIR, SUBACROMIAL DECOMPRESSION AND BICEPS TENODISIS.   Patient has a past medical history significant for pre-diabetes.   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  She fell directly on her right shoulder and her right knee.  Her right shoulder has been painful.  She has trouble with her activities.  She has weakness when she is driving a bus.  She is unhappy with her function.    Symptoms are rated as moderate to severe, and have been worsening.  This is significantly impairing activities of daily living.    Please see clinic note for further details on this patient's care.    She has elected for surgical management.   Past Medical History:  Diagnosis Date   Anxiety    Depression    Pre-diabetes    Right ovarian cyst 12/24/2014   5 cm right ovarian cyst   Past Surgical History:  Procedure Laterality Date   LAPAROSCOPIC SALPINGO OOPHERECTOMY N/A 05/05/2020   Procedure: RIGHT LAPAROSCOPIC SALPINGO OOPHORECTOMY, LEFT LAPAROSCOPIC SALPINGECTOMY;  Surgeon: Donnamae Jude, MD;  Location: [facility];  Service: Gynecology;  Laterality: N/A;   WISDOM TOOTH EXTRACTION     Social History   Socioeconomic History   Marital status: Single    Spouse name: Not on file   Number of children: Not on file   Years of education: Not on file   Highest education level: Not on file  Occupational History   Not on file  Tobacco Use   Smoking status: Former    Packs/day: 0    Types: Cigarettes    Quit date: 06/06/2014    Years since quitting: 8.0   Smokeless tobacco: Never  Vaping Use   Vaping Use: Never used  Substance and Sexual Activity   Alcohol use: Not Currently   Drug use: Not Currently   Sexual activity: Yes   Other Topics Concern   Not on file  Social History Narrative   Not on file   Social Determinants of Health   Financial Resource Strain: Not on file  Food Insecurity: Not on file  Transportation Needs: Not on file  Physical Activity: Not on file  Stress: Not on file  Social Connections: Not on file   Family History  Problem Relation Age of Onset   Cancer Sister        lymphoma non-Hodgkins   Arthritis Father    Heart disease Maternal Grandfather    Diabetes Maternal Grandfather    Diabetes Paternal Grandmother    Breast cancer Paternal Grandmother        68   Diabetes Paternal Grandfather    Allergies  Allergen Reactions   Escitalopram Oxalate     Other reaction(s): bad dreams   Novocain [Procaine Hcl] Other (See Comments)    Pt states that it makes her try to bite her tongue, mouth stayed numb for two days   Venlafaxine Hcl Er     Other reaction(s): anger worse   Prior to Admission medications   Medication Sig Start Date End Date Taking? Authorizing Provider  triamcinolone cream (KENALOG) 0.1 % Apply 1 application topically 2 (two) times daily. 12/14/16  Yes Barnet Glasgow, NP    ROS: All other systems have  been reviewed and were otherwise negative with the exception of those mentioned in the HPI and as above.  Physical Exam: General: Alert, no acute distress Cardiovascular: No pedal edema Respiratory: No cyanosis, no use of accessory musculature GI: No organomegaly, abdomen is soft and non-tender Skin: No lesions in the area of chief complaint Neurologic: Sensation intact distally Psychiatric: Patient is competent for consent with normal mood and affect Lymphatic: No axillary or cervical lymphadenopathy  MUSCULOSKELETAL:  Range of motion of the shoulder is to 170 degrees.  She has gross weakness with subscapularis and supraspinatus strength testing.  Positive O'Brien's.  Positive impingement.  Positive bicipital groove tenderness to palpation.  Positive AC  tenderness to palpation.    Imaging: Reviewed MRI which demonstrates a full thickness subscapularis tear.  Medialization of the biceps.  There is a coracoid calcification that seems to be impinging on the subscapularis.  There is a Type II acromion.  Significant AC arthritis with edema in the [facility] joint.  Assessment: RIGHT SHOULDER ROTATOR CUFF TEAR  Plan: Plan for Procedure(s): SHOULDER ARTHROSCOPY WITH ROTATOR CUFF REPAIR, SUBACROMIAL DECOMPRESSION AND BICEPS TENODISIS  Patient would benefit from surgery.  The risks, benefits and alternatives of a biceps tenodesis, distal clavicle excision, subacromial decompression, coracoplasty and repair of the cuff was discussed.    The risks benefits and alternatives were discussed with the patient including but not limited to the risks of nonoperative treatment, versus surgical intervention including infection, bleeding, nerve injury,  blood clots, cardiopulmonary complications, morbidity, mortality, among others, and they were willing to proceed.   The patient acknowledged the explanation, agreed to proceed with the plan and consent was signed.   Operative Plan: Right shoulder arthroscopy with BT, DCE, SAD, coracoplasty, rotator cuff repair Discharge Medications: standard DVT Prophylaxis: none Physical Therapy: outpatient PT Special Discharge needs: Sling. Cannon, PA-C  07/05/2022 3:04 PM

## 2022-07-05 NOTE — Anesthesia Preprocedure Evaluation (Signed)
Anesthesia Evaluation  Patient identified by MRN, date of birth, ID band Patient awake    Reviewed: Allergy & Precautions, NPO status , Patient's Chart, lab work & pertinent test results  Airway Mallampati: III       Dental no notable dental hx. (+) Teeth Intact, Dental Advisory Given, Missing   Pulmonary former smoker   Pulmonary exam normal breath sounds clear to auscultation       Cardiovascular negative cardio ROS Normal cardiovascular exam Rhythm:Regular Rate:Normal     Neuro/Psych  Headaches PSYCHIATRIC DISORDERS Anxiety Depression       GI/Hepatic negative GI ROS, Neg liver ROS,,,  Endo/Other  Obesity Pre diabetes  Renal/GU negative Renal ROS  negative genitourinary   Musculoskeletal negative musculoskeletal ROS (+)    Abdominal   Peds  Hematology negative hematology ROS (+)   Anesthesia Other Findings   Reproductive/Obstetrics                              Anesthesia Physical Anesthesia Plan  ASA: 2  Anesthesia Plan: General   Post-op Pain Management: Regional block*, Minimal or no pain anticipated, Dilaudid IV and Tylenol PO (pre-op)*   Induction: Intravenous  PONV Risk Score and Plan: 4 or greater and Treatment may vary due to age or medical condition, Midazolam, Ondansetron and Scopolamine patch - Pre-op  Airway Management Planned: Oral ETT and Video Laryngoscope Planned  Additional Equipment: None  Intra-op Plan:   Post-operative Plan: Extubation in OR  Informed Consent: I have reviewed the patients History and Physical, chart, labs and discussed the procedure including the risks, benefits and alternatives for the proposed anesthesia with the patient or authorized representative who has indicated his/her understanding and acceptance.     Dental advisory given  Plan Discussed with: CRNA and Anesthesiologist  Anesthesia Plan Comments:         Anesthesia  Quick Evaluation

## 2022-07-06 ENCOUNTER — Ambulatory Visit (HOSPITAL_BASED_OUTPATIENT_CLINIC_OR_DEPARTMENT_OTHER): Payer: BC Managed Care – PPO | Admitting: Certified Registered"

## 2022-07-06 ENCOUNTER — Encounter (HOSPITAL_BASED_OUTPATIENT_CLINIC_OR_DEPARTMENT_OTHER): Payer: Self-pay | Admitting: Orthopaedic Surgery

## 2022-07-06 ENCOUNTER — Other Ambulatory Visit: Payer: Self-pay

## 2022-07-06 ENCOUNTER — Encounter (HOSPITAL_BASED_OUTPATIENT_CLINIC_OR_DEPARTMENT_OTHER)
Admission: RE | Disposition: A | Discharge status: Home / Self Care | Payer: Self-pay | Source: Home / Self Care | Attending: Orthopaedic Surgery

## 2022-07-06 ENCOUNTER — Ambulatory Visit (HOSPITAL_BASED_OUTPATIENT_CLINIC_OR_DEPARTMENT_OTHER)
Admission: RE | Admit: 2022-07-06 | Discharge: 2022-07-06 | Disposition: A | Discharge status: Home / Self Care | Payer: BC Managed Care – PPO | Attending: Orthopaedic Surgery | Admitting: Orthopaedic Surgery

## 2022-07-06 DIAGNOSIS — M19011 Primary osteoarthritis, right shoulder: Secondary | ICD-10-CM | POA: Insufficient documentation

## 2022-07-06 DIAGNOSIS — F32A Depression, unspecified: Secondary | ICD-10-CM | POA: Diagnosis not present

## 2022-07-06 DIAGNOSIS — S46011A Strain of muscle(s) and tendon(s) of the rotator cuff of right shoulder, initial encounter: Secondary | ICD-10-CM | POA: Diagnosis not present

## 2022-07-06 DIAGNOSIS — Z01818 Encounter for other preprocedural examination: Secondary | ICD-10-CM

## 2022-07-06 DIAGNOSIS — Y9351 Activity, roller skating (inline) and skateboarding: Secondary | ICD-10-CM | POA: Diagnosis not present

## 2022-07-06 DIAGNOSIS — E669 Obesity, unspecified: Secondary | ICD-10-CM | POA: Diagnosis not present

## 2022-07-06 DIAGNOSIS — Z87891 Personal history of nicotine dependence: Secondary | ICD-10-CM | POA: Diagnosis not present

## 2022-07-06 DIAGNOSIS — W19XXXA Unspecified fall, initial encounter: Secondary | ICD-10-CM | POA: Insufficient documentation

## 2022-07-06 DIAGNOSIS — R519 Headache, unspecified: Secondary | ICD-10-CM | POA: Diagnosis not present

## 2022-07-06 DIAGNOSIS — F419 Anxiety disorder, unspecified: Secondary | ICD-10-CM | POA: Diagnosis not present

## 2022-07-06 DIAGNOSIS — M7541 Impingement syndrome of right shoulder: Secondary | ICD-10-CM | POA: Insufficient documentation

## 2022-07-06 DIAGNOSIS — R7303 Prediabetes: Secondary | ICD-10-CM | POA: Insufficient documentation

## 2022-07-06 DIAGNOSIS — M7521 Bicipital tendinitis, right shoulder: Secondary | ICD-10-CM | POA: Diagnosis not present

## 2022-07-06 DIAGNOSIS — Z6835 Body mass index (BMI) 35.0-35.9, adult: Secondary | ICD-10-CM | POA: Insufficient documentation

## 2022-07-06 HISTORY — PX: SHOULDER ARTHROSCOPY WITH ROTATOR CUFF REPAIR AND SUBACROMIAL DECOMPRESSION: SHX5686

## 2022-07-06 HISTORY — DX: Prediabetes: R73.03

## 2022-07-06 LAB — POCT PREGNANCY, URINE: Preg Test, Ur: NEGATIVE

## 2022-07-06 SURGERY — SHOULDER ARTHROSCOPY WITH ROTATOR CUFF REPAIR AND SUBACROMIAL DECOMPRESSION
Anesthesia: General | Site: Shoulder | Laterality: Right

## 2022-07-06 MED ORDER — PROPOFOL 10 MG/ML IV BOLUS
INTRAVENOUS | Status: AC
  Filled 2022-07-06: qty 20

## 2022-07-06 MED ORDER — CELECOXIB 100 MG PO CAPS: 100.0000 mg | ORAL_CAPSULE | Freq: Two times a day (BID) | ORAL | 0 refills | Status: AC

## 2022-07-06 MED ORDER — ONDANSETRON HCL 4 MG/2ML IJ SOLN: 4.0000 mg | Freq: Once | INTRAMUSCULAR | Status: DC | PRN

## 2022-07-06 MED ORDER — ONDANSETRON HCL 4 MG PO TABS: 4.0000 mg | ORAL_TABLET | Freq: Three times a day (TID) | ORAL | 0 refills | Status: AC | PRN

## 2022-07-06 MED ORDER — DEXAMETHASONE SODIUM PHOSPHATE 10 MG/ML IJ SOLN
INTRAMUSCULAR | Status: AC
  Filled 2022-07-06: qty 2

## 2022-07-06 MED ORDER — OXYCODONE HCL 5 MG PO TABS
5.0000 mg | ORAL_TABLET | Freq: Once | ORAL | Status: AC | PRN
  Administered 2022-07-06: 5 mg via ORAL

## 2022-07-06 MED ORDER — OXYCODONE HCL 5 MG PO TABS
ORAL_TABLET | ORAL | Status: AC
  Filled 2022-07-06: qty 1

## 2022-07-06 MED ORDER — EPINEPHRINE PF 1 MG/ML IJ SOLN
INTRAMUSCULAR | Status: AC
  Filled 2022-07-06: qty 1

## 2022-07-06 MED ORDER — ONDANSETRON HCL 4 MG/2ML IJ SOLN
INTRAMUSCULAR | Status: AC
  Filled 2022-07-06: qty 2

## 2022-07-06 MED ORDER — GABAPENTIN 300 MG PO CAPS
ORAL_CAPSULE | ORAL | Status: AC
  Filled 2022-07-06: qty 1

## 2022-07-06 MED ORDER — DEXAMETHASONE SODIUM PHOSPHATE 10 MG/ML IJ SOLN
INTRAMUSCULAR | Status: DC | PRN
  Administered 2022-07-06: 10 mg via INTRAVENOUS

## 2022-07-06 MED ORDER — ROCURONIUM BROMIDE 100 MG/10ML IV SOLN
INTRAVENOUS | Status: DC | PRN
  Administered 2022-07-06: 70 mg via INTRAVENOUS

## 2022-07-06 MED ORDER — HYDROMORPHONE HCL 1 MG/ML IJ SOLN: 0.2500 mg | INTRAMUSCULAR | Status: DC | PRN

## 2022-07-06 MED ORDER — SUGAMMADEX SODIUM 200 MG/2ML IV SOLN
INTRAVENOUS | Status: DC | PRN
  Administered 2022-07-06: 250 mg via INTRAVENOUS

## 2022-07-06 MED ORDER — FENTANYL CITRATE (PF) 100 MCG/2ML IJ SOLN
INTRAMUSCULAR | Status: DC | PRN
  Administered 2022-07-06: 100 ug via INTRAVENOUS

## 2022-07-06 MED ORDER — BUPIVACAINE LIPOSOME 1.3 % IJ SUSP
INTRAMUSCULAR | Status: DC | PRN
  Administered 2022-07-06: 10 mL via PERINEURAL

## 2022-07-06 MED ORDER — FENTANYL CITRATE (PF) 100 MCG/2ML IJ SOLN
INTRAMUSCULAR | Status: AC
  Filled 2022-07-06: qty 2

## 2022-07-06 MED ORDER — ACETAMINOPHEN 500 MG PO TABS
1000.0000 mg | ORAL_TABLET | Freq: Once | ORAL | Status: AC
  Administered 2022-07-06: 1000 mg via ORAL

## 2022-07-06 MED ORDER — PROPOFOL 10 MG/ML IV BOLUS
INTRAVENOUS | Status: DC | PRN
  Administered 2022-07-06: 150 mg via INTRAVENOUS

## 2022-07-06 MED ORDER — LIDOCAINE 2% (20 MG/ML) 5 ML SYRINGE: INTRAMUSCULAR | Status: DC | PRN

## 2022-07-06 MED ORDER — BUPIVACAINE HCL (PF) 0.5 % IJ SOLN
INTRAMUSCULAR | Status: DC | PRN
  Administered 2022-07-06: 20 mL via PERINEURAL

## 2022-07-06 MED ORDER — CEFAZOLIN SODIUM-DEXTROSE 2-4 GM/100ML-% IV SOLN
2.0000 g | INTRAVENOUS | Status: AC
  Administered 2022-07-06: 2 g via INTRAVENOUS

## 2022-07-06 MED ORDER — TRANEXAMIC ACID-NACL 1000-0.7 MG/100ML-% IV SOLN
INTRAVENOUS | Status: AC
  Filled 2022-07-06: qty 100

## 2022-07-06 MED ORDER — OXYCODONE HCL 5 MG PO TABS: ORAL_TABLET | ORAL | 0 refills | Status: AC

## 2022-07-06 MED ORDER — LIDOCAINE 2% (20 MG/ML) 5 ML SYRINGE
INTRAMUSCULAR | Status: DC | PRN
  Administered 2022-07-06: 80 mg via INTRAVENOUS

## 2022-07-06 MED ORDER — FENTANYL CITRATE (PF) 100 MCG/2ML IJ SOLN
50.0000 ug | Freq: Once | INTRAMUSCULAR | Status: AC
  Administered 2022-07-06: 50 ug via INTRAVENOUS

## 2022-07-06 MED ORDER — LACTATED RINGERS IV SOLN: INTRAVENOUS | Status: DC

## 2022-07-06 MED ORDER — PHENYLEPHRINE HCL (PRESSORS) 10 MG/ML IV SOLN
INTRAVENOUS | Status: DC | PRN
  Administered 2022-07-06 (×2): 80 ug via INTRAVENOUS

## 2022-07-06 MED ORDER — GABAPENTIN 100 MG PO CAPS: 100.0000 mg | ORAL_CAPSULE | Freq: Three times a day (TID) | ORAL | 0 refills | Status: AC

## 2022-07-06 MED ORDER — ROCURONIUM BROMIDE 10 MG/ML (PF) SYRINGE
PREFILLED_SYRINGE | INTRAVENOUS | Status: AC
  Filled 2022-07-06: qty 10

## 2022-07-06 MED ORDER — ACETAMINOPHEN 500 MG PO TABS
ORAL_TABLET | ORAL | Status: AC
  Filled 2022-07-06: qty 2

## 2022-07-06 MED ORDER — EPHEDRINE SULFATE (PRESSORS) 50 MG/ML IJ SOLN
INTRAMUSCULAR | Status: DC | PRN
  Administered 2022-07-06 (×5): 5 mg via INTRAVENOUS

## 2022-07-06 MED ORDER — SODIUM CHLORIDE 0.9 % IR SOLN
Status: DC | PRN
  Administered 2022-07-06 (×4): 3000 mL

## 2022-07-06 MED ORDER — LIDOCAINE 2% (20 MG/ML) 5 ML SYRINGE
INTRAMUSCULAR | Status: AC
  Filled 2022-07-06: qty 5

## 2022-07-06 MED ORDER — ONDANSETRON HCL 4 MG/2ML IJ SOLN
INTRAMUSCULAR | Status: DC | PRN
  Administered 2022-07-06: 4 mg via INTRAVENOUS

## 2022-07-06 MED ORDER — GABAPENTIN 300 MG PO CAPS
300.0000 mg | ORAL_CAPSULE | Freq: Once | ORAL | Status: AC
  Administered 2022-07-06: 300 mg via ORAL

## 2022-07-06 MED ORDER — CEFAZOLIN SODIUM-DEXTROSE 2-4 GM/100ML-% IV SOLN
INTRAVENOUS | Status: AC
  Filled 2022-07-06: qty 100

## 2022-07-06 MED ORDER — MIDAZOLAM HCL 2 MG/2ML IJ SOLN
INTRAMUSCULAR | Status: AC
  Filled 2022-07-06: qty 2

## 2022-07-06 MED ORDER — MIDAZOLAM HCL 2 MG/2ML IJ SOLN
2.0000 mg | Freq: Once | INTRAMUSCULAR | Status: AC
  Administered 2022-07-06: 2 mg via INTRAVENOUS

## 2022-07-06 MED ORDER — OXYCODONE HCL 5 MG/5ML PO SOLN: 5.0000 mg | Freq: Once | ORAL | Status: AC | PRN

## 2022-07-06 MED ORDER — TRANEXAMIC ACID-NACL 1000-0.7 MG/100ML-% IV SOLN
1000.0000 mg | INTRAVENOUS | Status: AC
  Administered 2022-07-06: 1000 mg via INTRAVENOUS

## 2022-07-06 MED ORDER — ACETAMINOPHEN 500 MG PO TABS: 1000.0000 mg | ORAL_TABLET | Freq: Three times a day (TID) | ORAL | 0 refills | Status: AC

## 2022-07-06 SURGICAL SUPPLY — 59 items
AID PSTN UNV HD RSTRNT DISP (MISCELLANEOUS) ×1
ANCH SUT 2 SWLK 19.1 CLS EYLT (Anchor) ×2 IMPLANT
ANCH SUT 2.6 FBRSTK 1.7 (Anchor) ×1 IMPLANT
ANCH SUT SWLK 19.1X4.75 (Anchor) ×1 IMPLANT
ANCHOR SUT BIO SW 4.75X19.1 (Anchor) IMPLANT
ANCHOR SUT FBRTK 2.6X1.7X2 (Anchor) IMPLANT
ANCHOR SWIVELOCK BIO 4.75X19.1 (Anchor) IMPLANT
APL PRP STRL LF DISP 70% ISPRP (MISCELLANEOUS) ×1
BLADE EXCALIBUR 4.0X13 (MISCELLANEOUS) ×1 IMPLANT
BURR OVAL 8 FLU 4.0X13 (MISCELLANEOUS) ×1 IMPLANT
CANNULA 5.75X71 LONG (CANNULA) IMPLANT
CANNULA PASSPORT 5 (CANNULA) IMPLANT
CANNULA PASSPORT BUTTON 10-40 (CANNULA) IMPLANT
CANNULA TWIST IN 8.25X7CM (CANNULA) IMPLANT
CHLORAPREP W/TINT 26 (MISCELLANEOUS) ×1 IMPLANT
CLSR STERI-STRIP ANTIMIC 1/2X4 (GAUZE/BANDAGES/DRESSINGS) ×1 IMPLANT
COOLER ICEMAN CLASSIC (MISCELLANEOUS) ×1 IMPLANT
DRAPE IMP U-DRAPE 54X76 (DRAPES) ×1 IMPLANT
DRAPE INCISE IOBAN 66X45 STRL (DRAPES) IMPLANT
DRAPE SHOULDER BEACH CHAIR (DRAPES) ×1 IMPLANT
DW OUTFLOW CASSETTE/TUBE SET (MISCELLANEOUS) ×1 IMPLANT
GAUZE PAD ABD 8X10 STRL (GAUZE/BANDAGES/DRESSINGS) ×1 IMPLANT
GAUZE SPONGE 4X4 12PLY STRL (GAUZE/BANDAGES/DRESSINGS) ×1 IMPLANT
GLOVE BIO SURGEON STRL SZ 6.5 (GLOVE) ×1 IMPLANT
GLOVE BIOGEL PI IND STRL 6.5 (GLOVE) ×1 IMPLANT
GLOVE BIOGEL PI IND STRL 8 (GLOVE) ×1 IMPLANT
GLOVE ECLIPSE 8.0 STRL XLNG CF (GLOVE) ×1 IMPLANT
GOWN STRL REUS W/ TWL LRG LVL3 (GOWN DISPOSABLE) ×2 IMPLANT
GOWN STRL REUS W/TWL LRG LVL3 (GOWN DISPOSABLE) ×3
GOWN STRL REUS W/TWL XL LVL3 (GOWN DISPOSABLE) ×1 IMPLANT
KIT STABILIZATION SHOULDER (MISCELLANEOUS) ×1 IMPLANT
KIT STR SPEAR 1.8 FBRTK DISP (KITS) IMPLANT
LASSO 90 CVE QUICKPAS (DISPOSABLE) IMPLANT
LASSO CRESCENT QUICKPASS (SUTURE) IMPLANT
MANIFOLD NEPTUNE II (INSTRUMENTS) ×1 IMPLANT
NDL HD SCORPION MEGA LOADER (NEEDLE) IMPLANT
NDL SAFETY ECLIP 18X1.5 (MISCELLANEOUS) ×1 IMPLANT
PACK ARTHROSCOPY DSU (CUSTOM PROCEDURE TRAY) ×1 IMPLANT
PACK BASIN DAY SURGERY FS (CUSTOM PROCEDURE TRAY) ×1 IMPLANT
PAD COLD SHLDR WRAP-ON (PAD) ×1 IMPLANT
PAD ORTHO SHOULDER 7X19 LRG (SOFTGOODS) ×1 IMPLANT
PORT APPOLLO RF 90DEGREE MULTI (SURGICAL WAND) ×1 IMPLANT
RESTRAINT HEAD UNIVERSAL NS (MISCELLANEOUS) ×1 IMPLANT
SHEET MEDIUM DRAPE 40X70 STRL (DRAPES) ×1 IMPLANT
SLEEVE SCD COMPRESS KNEE MED (STOCKING) ×1 IMPLANT
SUT FIBERWIRE #2 38 T-5 BLUE (SUTURE) ×1
SUT MNCRL AB 4-0 PS2 18 (SUTURE) ×1 IMPLANT
SUT PDS AB 0 CT 36 (SUTURE) IMPLANT
SUT PDS AB 1 CT  36 (SUTURE)
SUT PDS AB 1 CT 36 (SUTURE) IMPLANT
SUT TIGER TAPE 7 IN WHITE (SUTURE) IMPLANT
SUTURE FIBERWR #2 38 T-5 BLUE (SUTURE) IMPLANT
SUTURE TAPE TIGERLINK 1.3MM BL (SUTURE) IMPLANT
SUTURETAPE TIGERLINK 1.3MM BL (SUTURE) ×1
SYR 5ML LL (SYRINGE) ×1 IMPLANT
TAPE FIBER 2MM 7IN #2 BLUE (SUTURE) IMPLANT
TOWEL GREEN STERILE FF (TOWEL DISPOSABLE) ×2 IMPLANT
TUBE CONNECTING 20X1/4 (TUBING) ×1 IMPLANT
TUBING ARTHROSCOPY IRRIG 16FT (MISCELLANEOUS) ×1 IMPLANT

## 2022-07-06 NOTE — Anesthesia Procedure Notes (Signed)
Anesthesia Regional Block: Interscalene brachial plexus block   Pre-Anesthetic Checklist: , timeout performed,  Correct Patient, Correct Site, Correct Laterality,  Correct Procedure, Correct Position, site marked,  Risks and benefits discussed,  Surgical consent,  Pre-op evaluation,  At surgeon's request and post-op pain management  Laterality: Right  Prep: chloraprep       Needles:  Injection technique: Single-shot  Needle Type: Echogenic Stimulator Needle     Needle Length: 10cm  Needle Gauge: 21   Needle insertion depth: 5 cm   Additional Needles:   Procedures:,,,, ultrasound used (permanent image in chart),,   Motor weakness within 10 minutes.  Narrative:  Start time: 07/06/2022 7:23 AM End time: 07/06/2022 7:28 AM Injection made incrementally with aspirations every 5 mL.  Performed by: Personally  Anesthesiologist: Josephine Igo, MD  Additional Notes: Timeout performed. Patient sedated. Relevant anatomy ID'd using [place]. Incremental 2-44ml injection of LA with frequent aspiration. Patient tolerated procedure well.

## 2022-07-06 NOTE — Interval H&P Note (Signed)
All questions answered, patient wants to proceed with procedure. ? ?

## 2022-07-06 NOTE — Anesthesia Procedure Notes (Signed)
Procedure Name: Intubation Date/Time: 07/06/2022 8:08 AM  Performed by: Lavonia Dana, CRNAPre-anesthesia Checklist: Patient identified, Emergency Drugs available, Suction available and Patient being monitored Patient Re-evaluated:Patient Re-evaluated prior to induction Oxygen Delivery Method: Circle system utilized Preoxygenation: Pre-oxygenation with 100% oxygen Induction Type: IV induction Ventilation: Mask ventilation without difficulty and Oral airway inserted - appropriate to patient size Laryngoscope Size: Glidescope and 3 Grade View: Grade I Tube type: Oral Tube size: 6.5 mm Number of attempts: 1 Airway Equipment and Method: Stylet, Oral airway and Bite block Placement Confirmation: ETT inserted through vocal cords under direct vision, positive ETCO2 and breath sounds checked- equal and bilateral Secured at: 22 cm Tube secured with: Tape Dental Injury: Teeth and Oropharynx as per pre-operative assessment

## 2022-07-06 NOTE — Op Note (Signed)
Orthopaedic Surgery Operative Note (CSN: [id number])  Veronica Obrien  10-25-1980 Date of Surgery: 07/06/2022   DIAGNOSES: Right shoulder, acute traumatic rotator cuff tear, SLAP tear, biceps tendinitis, AC arthritis, and subacromial impingement.  POST-OPERATIVE DIAGNOSIS: same  PROCEDURE: Arthroscopic extensive debridement - 29823 Subdeltoid Bursa, Supraspinatus Tendon, Anterior Labrum, Superior Labrum, and Posterior Labrum Arthroscopic distal clavicle excision [id number] Arthroscopic subacromial decompression [id number] Arthroscopic rotator cuff repair [id number] Arthroscopic biceps tenodesis [id number]   OPERATIVE FINDING: Exam under anesthesia: Normal Articular space: Anterior and posterior labral tearing Chondral surfaces: Normal Biceps:  Type II SLAP tear Subscapularis: Complete tear full-thickness complete tear of the subscapularis with retraction to the mid humeral head.  Medialization of the biceps Supraspinatus: Incomplete tear undersurface tear of the supraspinatus with easily probed through tissue in the bursal side.  Greater than 60% of the supraspinatus was torn.  Speed fix repair completed with a single anchor. Infraspinatus: Intact      Post-operative plan: The patient will be non-weightbearing in a sling 6 weeks, 4-6 week delay for therapy.  The patient will be discharged home.  DVT prophylaxis not indicated in ambulatory upper extremity patient without known risk factors.   Pain control with PRN pain medication preferring oral medicines.  Follow up plan will be scheduled in approximately 7 days for incision check and XR.  Surgeons:Primary: Hiram Gash, MD Assistants:Caroline McBane PA-C Location: [facility] OR ROOM 1 Anesthesia: General with Exparel interscalene block Antibiotics: Ancef 2 g Tourniquet time: None Estimated Blood Loss: Minimal Complications: None Specimens: None Implants: Implant Name Type Inv. Item Serial No. Manufacturer Lot No. LRB No. Used Action  ANCHOR SUT  FBRTK 2.6X1.7X2 - [id number] Anchor ANCHOR SUT FBRTK 2.6X1.7X2  [employer] [id number] Right 1 Implanted  [facility] BIO 4.75X19.1 - [id number] Anchor ANCHOR [place] BIO 4.75X19.1  [place] [id number] Right 1 Implanted  [facility] BIO 4.75X19.1 - [id number] Anchor ANCHOR SWIVELOCK BIO 4.75X19.1  [employer] [id number] Right 1 Implanted  ANCHOR SUT BIO SW 4.75X19.1 - [id number] Anchor ANCHOR SUT BIO SW 4.75X19.1  Rolinda Roan [id number] Right 1 Implanted    Indications for Surgery:   Veronica Obrien is a 42 y.o. female with continued shoulder pain refractory to nonoperative measures for extended period of time.    The risks and benefits were explained at length including but not limited to continued pain, cuff failure, biceps tenodesis failure, stiffness, need for further surgery and infection.   Procedure:   Patient was correctly identified in the preoperative holding area and operative site marked.  Patient brought to OR and positioned beachchair on an [place] table ensuring that all bony prominences were padded and the head was in an appropriate location.  Anesthesia was induced and the operative shoulder was prepped and draped in the usual sterile fashion.  Timeout was called preincision.  A standard posterior viewing portal was made after localizing the portal with a spinal needle.  An anterior accessory portal was also made.  After clearing the articular space the camera was positioned in the subacromial space.  Findings above.    Extensive debridement was performed of the anterior interval tissue, labral fraying and the bursa.  Humeral bone, humeral cartilage, glenoid bone and glenoid cartilage were debrided.  Subacromial decompression: We made a lateral portal with spinal needle guidance. We then proceeded to debride bursal tissue extensively with a shaver and arthrocare device. At that point we continued to identify the borders of the acromion and identify the spur. We then  carefully  preserved the deltoid fascia and used a burr to convert the acromion to a Type 1 flat acromion without issue.  Distal Clavicle resection:  The scope was placed in the subacromial space from the posterior portal.  A hemostat was placed through the anterior portal and we spread at the [facility] joint.  A burr was then inserted and 10 mm of distal clavicle was resected taking care to avoid damage to the capsule around the joint and avoiding overhanging bone posteriorly.    Subscapularis repair: We identified there is a full-thickness subscapularis repair.  We mobilized the tissue and used multiple portals to place a variety of sutures including a retraction stitch.  We then were able to clear the tuberosity down to a bleeding bed.  We placed a medial 2.6 fiber tack anchor.  We passed the tapes using a crescent device in the articular space.  He went to the bursal side and cleared the bursa.  That point were able to place 2 swivel lock anchors in the bicipital groove after removing the biceps out of the groove.  We tenotomized the biceps while we were in the joint.  We were able to bring the tapes over to the swivel locks and went back in the joint to verify that we had a recreation of the rolled border which we did.  At that point we were able to use the knotless safety stitches to pass in a luggage loop type fixation method around the biceps and form a biceps tenodesis just above the pec tendon in the groove.  Supraspinatus repair: We identified that the anterior leading edge of the supraspinatus was easily probed through.  We completed the tear using a shaver and then were able to debride the tuberosity back to a bleeding surface.  We used a fiber tape in a mattress fashion inverted and brought this over to a 4.75 bio composite swivel lock.  We had good fixation of the tendon and bone.  The incisions were closed with absorbable monocryl and steri strips.  A sterile dressing was placed along with a sling. The patient  was awoken from general anesthesia and taken to the PACU in stable condition without complication.   Noemi Chapel, PA-C, present and scrubbed throughout the case, critical for completion in a timely fashion, and for retraction, instrumentation, closure.

## 2022-07-06 NOTE — Progress Notes (Signed)
Assisted Dr. Foster with right, interscalene , ultrasound guided block. Side rails up, monitors on throughout procedure. See vital signs in flow sheet. Tolerated Procedure well. 

## 2022-07-06 NOTE — Transfer of Care (Signed)
Immediate Anesthesia Transfer of Care Note  Patient: DORNA PATTAN  Procedure(s) Performed: SHOULDER ARTHROSCOPY WITH ROTATOR CUFF REPAIR, SUBACROMIAL DECOMPRESSION, BICEPS TENODISIS, DISTAL CLAVICLE EXCISION, AND CORACOPLASTY (Right: Shoulder)  Patient Location: PACU  Anesthesia Type:GA combined with regional for post-op pain  Level of Consciousness: drowsy  Airway & Oxygen Therapy: Patient Spontanous Breathing and Patient connected to face mask oxygen  Post-op Assessment: Report given to RN and Post -op Vital signs reviewed and stable  Post vital signs: Reviewed and stable  Last Vitals:  Vitals Value Taken Time  BP 116/75 (87)   Temp    Pulse 90 07/06/22 0938  Resp 15   SpO2 97 % 07/06/22 0938  Vitals shown include unvalidated device data.  Last Pain:  Vitals:   07/06/22 0646  TempSrc: Oral  PainSc: 6          Complications: No notable events documented.

## 2022-07-06 NOTE — Anesthesia Postprocedure Evaluation (Signed)
Anesthesia Post Note  Patient: Veronica Obrien  Procedure(s) Performed: SHOULDER ARTHROSCOPY WITH ROTATOR CUFF REPAIR, SUBACROMIAL DECOMPRESSION, BICEPS TENODISIS, DISTAL CLAVICLE EXCISION, AND CORACOPLASTY (Right: Shoulder)     Patient location during evaluation: PACU Anesthesia Type: General Level of consciousness: awake and alert and oriented Pain management: pain level controlled Vital Signs Assessment: post-procedure vital signs reviewed and stable Respiratory status: spontaneous breathing, nonlabored ventilation and respiratory function stable Cardiovascular status: blood pressure returned to baseline and stable Postop Assessment: no apparent nausea or vomiting Anesthetic complications: no   No notable events documented.  Last Vitals:  Vitals:   07/06/22 0945 07/06/22 1000  BP: 116/71 122/77  Pulse: 84 92  Resp: 17 18  Temp:    SpO2: 97% 100%    Last Pain:  Vitals:   07/06/22 1000  TempSrc:   PainSc: 3                  Makaila Windle A.

## 2022-07-07 ENCOUNTER — Encounter (HOSPITAL_BASED_OUTPATIENT_CLINIC_OR_DEPARTMENT_OTHER): Payer: Self-pay | Admitting: Orthopaedic Surgery

## 2023-01-31 ENCOUNTER — Emergency Department (HOSPITAL_COMMUNITY)
Admission: EM | Admit: 2023-01-31 | Discharge: 2023-02-01 | Disposition: A | Discharge status: Home / Self Care | Payer: BC Managed Care – PPO | Attending: Emergency Medicine | Admitting: Emergency Medicine

## 2023-01-31 ENCOUNTER — Ambulatory Visit
Admission: EM | Admit: 2023-01-31 | Discharge: 2023-01-31 | Disposition: A | Discharge status: Home / Self Care | Payer: BC Managed Care – PPO

## 2023-01-31 DIAGNOSIS — R55 Syncope and collapse: Secondary | ICD-10-CM | POA: Insufficient documentation

## 2023-01-31 DIAGNOSIS — R519 Headache, unspecified: Secondary | ICD-10-CM | POA: Diagnosis not present

## 2023-01-31 DIAGNOSIS — Z1152 Encounter for screening for COVID-19: Secondary | ICD-10-CM | POA: Insufficient documentation

## 2023-01-31 DIAGNOSIS — L309 Dermatitis, unspecified: Secondary | ICD-10-CM | POA: Insufficient documentation

## 2023-01-31 DIAGNOSIS — J0100 Acute maxillary sinusitis, unspecified: Secondary | ICD-10-CM

## 2023-01-31 DIAGNOSIS — J40 Bronchitis, not specified as acute or chronic: Secondary | ICD-10-CM

## 2023-01-31 DIAGNOSIS — R4020 Unspecified coma: Secondary | ICD-10-CM

## 2023-01-31 LAB — URINALYSIS, ROUTINE W REFLEX MICROSCOPIC
Bacteria, UA: NONE SEEN
Bilirubin Urine: NEGATIVE
Glucose, UA: NEGATIVE mg/dL
Ketones, ur: NEGATIVE mg/dL
Leukocytes,Ua: NEGATIVE
Nitrite: NEGATIVE
Protein, ur: NEGATIVE mg/dL
Specific Gravity, Urine: 1.023 (ref 1.005–1.030)
pH: 5 (ref 5.0–8.0)

## 2023-01-31 LAB — BASIC METABOLIC PANEL
Anion gap: 11 (ref 5–15)
BUN: 12 mg/dL (ref 6–20)
CO2: 24 mmol/L (ref 22–32)
Calcium: 9.2 mg/dL (ref 8.9–10.3)
Chloride: 104 mmol/L (ref 98–111)
Creatinine, Ser: 0.76 mg/dL (ref 0.44–1.00)
GFR, Estimated: >60 mL/min (ref >60)
Glucose, Bld: 98 mg/dL (ref 70–99)
Potassium: 3.8 mmol/L (ref 3.5–5.1)
Sodium: 139 mmol/L (ref 135–145)

## 2023-01-31 LAB — CBC
HCT: 45.6 % (ref 36.0–46.0)
Hemoglobin: 14.6 g/dL (ref 12.0–15.0)
MCH: 27.8 pg (ref 26.0–34.0)
MCHC: 32 g/dL (ref 30.0–36.0)
MCV: 86.7 fL (ref 80.0–100.0)
Platelets: 457 10*3/uL — ABNORMAL HIGH (ref 150–400)
RBC: 5.26 MIL/uL — ABNORMAL HIGH (ref 3.87–5.11)
RDW: 13.6 % (ref 11.5–15.5)
WBC: 12.2 10*3/uL — ABNORMAL HIGH (ref 4.0–10.5)
nRBC: 0 % (ref 0.0–0.2)

## 2023-01-31 LAB — HCG, SERUM, QUALITATIVE: Preg, Serum: NEGATIVE

## 2023-01-31 LAB — CBG MONITORING, ED: Glucose-Capillary: 94 mg/dL (ref 70–99)

## 2023-01-31 NOTE — ED Triage Notes (Signed)
Pt coming from home dude to feeling like she had a syncope event while sitting on the couch she would have not fallen if she synopsized but unsure if she did. States she woke up an hour later feeling off. Pt is recovering from a viral illness she had around 3 weeks ago. She does have a headache but no chest pain, no shortness of breath and no injuries.

## 2023-01-31 NOTE — ED Notes (Signed)
Patient currently has no symptoms of concern, will wait on parent to come for transport and will be monitored in room until then.  B. Roten CMA

## 2023-01-31 NOTE — ED Notes (Signed)
Patient is being discharged from the Urgent Care and sent to the Emergency Department via Private Vehicle (Mother will driver her) . Per R. Reggie Pile, patient is in need of higher level of care due to LOC/Passing out approximately 1.5 hrs. Patient is aware and verbalizes understanding of plan of care.  Vitals:   01/31/23 1820  BP: 114/71  Pulse: (!) 102  Resp: 18  Temp: 98.9 F (37.2 C)  SpO2: 99%

## 2023-01-31 NOTE — ED Triage Notes (Signed)
"  I passed out today, I woke up this morning, felt dizzy with nausea, while waiting for my boss to respond to a text, passed out on cough and woke up about an hour later". No visible injury. Parent/Mother brought her to Urgent Care.

## 2023-01-31 NOTE — ED Provider Notes (Signed)
Patient reported loss of consciousness earlier this morning and notes she had felt dizzy with nausea as well as headache.  Headache has resolved now.  She denies any numbness or tingling.  Recommended further evaluation in the emergency room and mother will transport her via POV immediately after leaving office.   Tomi Bamberger, PA-C 01/31/23 [phone]

## 2023-02-01 ENCOUNTER — Emergency Department (HOSPITAL_COMMUNITY): Payer: BC Managed Care – PPO

## 2023-02-01 LAB — RESP PANEL BY RT-PCR (RSV, FLU A&B, COVID)  RVPGX2
Influenza A by PCR: NEGATIVE
Influenza B by PCR: NEGATIVE
Resp Syncytial Virus by PCR: NEGATIVE
SARS Coronavirus 2 by RT PCR: NEGATIVE

## 2023-02-01 MED ORDER — PROCHLORPERAZINE EDISYLATE 10 MG/2ML IJ SOLN: 10.0000 mg | Freq: Once | INTRAMUSCULAR | Status: DC

## 2023-02-01 MED ORDER — KETOROLAC TROMETHAMINE 15 MG/ML IJ SOLN
15.0000 mg | Freq: Once | INTRAMUSCULAR | Status: AC
  Administered 2023-02-01: 15 mg via INTRAMUSCULAR
  Filled 2023-02-01: qty 1

## 2023-02-01 MED ORDER — PREDNISONE 20 MG PO TABS
40.0000 mg | ORAL_TABLET | Freq: Every day | ORAL | 0 refills | Status: AC
Start: 2023-02-01 — End: 2023-02-06

## 2023-02-01 MED ORDER — PROCHLORPERAZINE MALEATE 5 MG PO TABS
10.0000 mg | ORAL_TABLET | Freq: Once | ORAL | Status: AC
  Administered 2023-02-01: 10 mg via ORAL
  Filled 2023-02-01: qty 2

## 2023-02-01 MED ORDER — AMOXICILLIN-POT CLAVULANATE 875-125 MG PO TABS: 1.0000 | ORAL_TABLET | Freq: Two times a day (BID) | ORAL | 0 refills | Status: DC

## 2023-02-01 MED ORDER — KETOROLAC TROMETHAMINE 15 MG/ML IJ SOLN: 15.0000 mg | Freq: Once | INTRAMUSCULAR | Status: DC

## 2023-02-01 NOTE — Discharge Instructions (Addendum)
You were seen in the ER for evaluation of your symptoms. I'm glad that you are feeling better. Please make sure that you are staying well hydrated drinking plenty of fluids, mainly water. I am sending you home with two medications to take as prescribed to help with your cough and cold symptoms. I would like for you to follow up with a neurologist for your frequent headaches.  Additionally, please make sure you follow with your PCP within the next few days for reevaluation.  If you start to feel lightheaded again or that you are about to pass out, have a syncopal episode, worsening headaches, chest pain, shortness of breath, fever, chest pain, shortness of breath, please return to your nearest emergency department for reevaluation.  Contact a doctor if: You have episodes of near fainting. Get help right away if: You pass out or faint. You hit your head or are injured after fainting. You have any of these symptoms: Fast or uneven heartbeats (palpitations). Pain in your chest, belly, or back. Shortness of breath. You have jerky movements that you cannot control (seizure). You have a very bad headache. You are confused. You have problems with how you see (vision). You are very weak. You have trouble walking. You are bleeding from your mouth or your butt (rectum). You have black or tarry poop (stool). These symptoms may be an emergency. Get help right away. Call your local emergency services (911 in the [place] Do not wait to see if the symptoms will go away. Do not drive yourself to the hospital.

## 2023-02-01 NOTE — ED Provider Notes (Signed)
Physical Exam  BP 104/89   Pulse 72   Temp 98.2 F (36.8 C) (Oral)   Resp 18   LMP  (LMP Unknown)   SpO2 99%   Physical Exam Constitutional:      General: She is not in acute distress.    Appearance: She is not toxic-appearing.  HENT:     Right Ear: Tympanic membrane, ear canal and external ear normal.     Left Ear: Tympanic membrane, ear canal and external ear normal.     Nose: Nose normal.     Mouth/Throat:     Mouth: Mucous membranes are moist.  Eyes:     General: No scleral icterus.    Extraocular Movements: Extraocular movements intact.     Pupils: Pupils are equal, round, and reactive to light.  Cardiovascular:     Rate and Rhythm: Normal rate.     Pulses: Normal pulses.          Radial pulses are 2+ on the right side and 2+ on the left side.       Dorsalis pedis pulses are 2+ on the right side and 2+ on the left side.       Posterior tibial pulses are 2+ on the right side and 2+ on the left side.  Pulmonary:     Effort: Pulmonary effort is normal. No respiratory distress.     Breath sounds: Normal breath sounds. No wheezing.  Musculoskeletal:     Cervical back: Normal range of motion.     Right lower leg: No edema.     Left lower leg: No edema.  Skin:    General: Skin is warm and dry.     Comments: Eczema changes to the bilateral feet  Neurological:     General: No focal deficit present.     Mental Status: She is alert.     GCS: GCS eye subscore is 4. GCS verbal subscore is 5. GCS motor subscore is 6.     Cranial Nerves: No cranial nerve deficit, dysarthria or facial asymmetry.     Sensory: No sensory deficit.     Motor: No weakness.     Procedures  Procedures  ED Course / MDM    Medical Decision Making Amount and/or Complexity of Data Reviewed Labs: ordered. Radiology: ordered.  Risk Prescription drug management.   Accepted handoff at shift change from [facility], [place]. Please see prior provider note for more detail.   Briefly: Patient is  42 y.o. F presenting to the ER for evaluation of possible syncopal episode. Patient has been having URI symptoms for the past 2-3 weeks with productive cough and congestion. Did have a fever the first week, but has remained afebrile. Was complaining of a headache off and on. Reports she gets these around once a month.   Plan: Follow up on CXR and migraine cocktail.   CXR shows generalized airway thickening.   EKG reviewed and interpreted by my attending and read as Normal sinus rhythm Normal ECG When compared with ECG of 27-Jun-2014 17:23, PREVIOUS ECG IS PRESENT No acute changes No significant change since last tracing.  On re-evaluation, the patient reports that she is feeling much better and that her headache has resolved. She reports that she felt nausea and lightheadedness before syncope, but no chest pain, SOB, or palpitations. She reports that she woke up on her couch which was where she was sitting when she was waiting to hear back from her boss if she could stay home  from work. Has had an intermittent headache since then with some lightheadedness. Now, she is feeling much better and even feels like her cough has improved. Her lungs are clear to auscultation. She has a benign neurological exam. Vital signs are stable. Will treat her with prednisone for the bronchitis and Augmentin for the sinusitis given that she meets MIPS criteria with >10 days and failed OTC treatment. I have discussed with her to follow up with a neurologist for symptoms. We discussed need to follow up with her established PCP and neurology. Unsure if this was syncopal or if she fell asleep on the couch. VSS here and she has a benign neurological exam. Still denies any chest pain, palpitations, or SOB. She denies any chest pain or SOB. Will have her follow up closely outpatient and she was given strict return precautions as well. We discussed strict return precautions and red flag symptoms. She verbalized her understanding  and agrees to the plan. The patient is stable and being discharged home.   DG Chest 2 View  Result Date: 02/01/2023 CLINICAL DATA:  Shortness of breath.  Passed out last night EXAM: CHEST - 2 VIEW COMPARISON:  01/15/2018 FINDINGS: Normal heart size and mediastinal contours. Bilateral airway thickening. No acute infiltrate or edema. No effusion or pneumothorax. No acute osseous findings. IMPRESSION: Generalized airway thickening.  No focal abnormality. Electronically Signed   By: Tiburcio Pea M.D.   On: 02/01/2023 07:04   CT Head Wo Contrast  Result Date: 02/01/2023 CLINICAL DATA:  Sudden severe headache. EXAM: CT HEAD WITHOUT CONTRAST TECHNIQUE: Contiguous axial images were obtained from the base of the skull through the vertex without intravenous contrast. RADIATION DOSE REDUCTION: This exam was performed according to the departmental dose-optimization program which includes automated exposure control, adjustment of the mA and/or kV according to patient size and/or use of iterative reconstruction technique. COMPARISON:  06/27/2014 brain MRI FINDINGS: Brain: No evidence of acute infarction, hemorrhage, hydrocephalus, extra-axial collection or mass lesion/mass effect. Vascular: No hyperdense vessel or unexpected calcification. Skull: Normal. Negative for fracture or focal lesion. Sinuses/Orbits: No acute finding. IMPRESSION: Normal head CT. Electronically Signed   By: Tiburcio Pea M.D.   On: 02/01/2023 05:40         Achille Rich, PA-C 02/01/23 1610    Derwood Kaplan, MD 02/02/23 1049

## 2023-02-01 NOTE — ED Provider Notes (Signed)
[facility] Provider Note   CSN: [id number] Arrival date & time: 01/31/23  4098     History  Chief Complaint  Patient presents with   Loss of Consciousness    DEVAYA VELTRI is a 42 y.o. female.  Patient presents to the emergency room complaining of possible loss of consciousness which happened earlier in the day on Wednesday.  Patient states she woke up feeling "sick" and called work about possibly staying home.  She states that while waiting to hear back from work she lost track of approximately 1 hour of time.  Upon awakening she reported a headache.  She continued to feel unwell throughout the day.  The patient was seen at urgent care complaining of some dizziness and nausea and PA recommended she come to the emergency department for further evaluation.  Patient states the headache completely resolved prior to arrival at the emergency department but over the past 2 hours the headache has returned and is now rated as 7 out of 10 in severity.  Headache is described as being near the posterior of her head.  Patient also continues to endorse mild nausea.  She denies emesis, fever, chest pain, shortness of breath, urinary symptoms.  The patient states that over the past 2 to 3 weeks she has had chest congestion with cough.  She has tried Mucinex with no relief of symptoms.  Past medical history significant for complicated migraines, anxiety   Loss of Consciousness      Home Medications Prior to Admission medications   Medication Sig Start Date End Date Taking? Authorizing Provider  bacitracin-neomycin-polymyxin-hydrocortisone (CORTISPORIN) 1 % ophthalmic ointment Place 1 Application into both eyes 2 (two) times daily. 11/11/18   [provider]  meloxicam (MOBIC) 15 MG tablet Take 15 mg by mouth daily. 11/11/18   [provider]  triamcinolone cream (KENALOG) 0.1 % Apply 1 application topically 2 (two) times daily. 12/14/16    Dorena Bodo, NP  triamcinolone lotion (KENALOG) 0.1 % Apply 1 Application topically 2 (two) times daily. 11/11/18   [provider]      Allergies    Escitalopram, Escitalopram oxalate, Novocain [procaine hcl], Other, Procaine, and Venlafaxine hcl er    Review of Systems   Review of Systems  Cardiovascular:  Positive for syncope.    Physical Exam Updated Vital Signs BP 118/76   Pulse 82   Temp 98.2 F (36.8 C) (Oral)   Resp 16   LMP  (LMP Unknown)   SpO2 98%  Physical Exam Vitals and nursing note reviewed.  Constitutional:      General: She is not in acute distress.    Appearance: She is well-developed.  HENT:     Head: Normocephalic and atraumatic.  Eyes:     Conjunctiva/sclera: Conjunctivae normal.  Cardiovascular:     Rate and Rhythm: Normal rate and regular rhythm.  Pulmonary:     Effort: Pulmonary effort is normal. No respiratory distress.     Breath sounds: Normal breath sounds.  Abdominal:     Palpations: Abdomen is soft.     Tenderness: There is no abdominal tenderness.  Musculoskeletal:        General: No swelling.     Cervical back: Neck supple.  Skin:    General: Skin is warm and dry.     Capillary Refill: Capillary refill takes less than 2 seconds.  Neurological:     General: No focal deficit present.     Mental  Status: She is alert and oriented to person, place, and time.     Sensory: No sensory deficit.     Motor: No weakness.  Psychiatric:        Mood and Affect: Mood normal.     ED Results / Procedures / Treatments   Labs (all labs ordered are listed, but only abnormal results are displayed) Labs Reviewed  CBC - Abnormal; Notable for the following components:      Result Value   WBC 12.2 (*)    RBC 5.26 (*)    Platelets 457 (*)    All other components within normal limits  URINALYSIS, ROUTINE W REFLEX MICROSCOPIC - Abnormal; Notable for the following components:   Hgb urine dipstick SMALL (*)    All other components  within normal limits  BASIC METABOLIC PANEL  HCG, SERUM, QUALITATIVE  CBG MONITORING, ED    EKG None  Radiology CT Head Wo Contrast  Result Date: 02/01/2023 CLINICAL DATA:  Sudden severe headache. EXAM: CT HEAD WITHOUT CONTRAST TECHNIQUE: Contiguous axial images were obtained from the base of the skull through the vertex without intravenous contrast. RADIATION DOSE REDUCTION: This exam was performed according to the departmental dose-optimization program which includes automated exposure control, adjustment of the mA and/or kV according to patient size and/or use of iterative reconstruction technique. COMPARISON:  06/27/2014 brain MRI FINDINGS: Brain: No evidence of acute infarction, hemorrhage, hydrocephalus, extra-axial collection or mass lesion/mass effect. Vascular: No hyperdense vessel or unexpected calcification. Skull: Normal. Negative for fracture or focal lesion. Sinuses/Orbits: No acute finding. IMPRESSION: Normal head CT. Electronically Signed   By: Tiburcio Pea M.D.   On: 02/01/2023 05:40    Procedures Procedures    Medications Ordered in ED Medications  ketorolac (TORADOL) 15 MG/ML injection 15 mg (has no administration in time range)  prochlorperazine (COMPAZINE) tablet 10 mg (has no administration in time range)    ED Course/ Medical Decision Making/ A&P                                 Medical Decision Making Amount and/or Complexity of Data Reviewed Labs: ordered. Radiology: ordered.   This patient presents to the ED for concern of syncope, this involves an extensive number of treatment options, and is a complaint that carries with it a high risk of complications and morbidity.  The differential diagnosis includes dysrhythmia, dehydration, vasovagal response, intracranial abnormality, migraine.   Co morbidities that complicate the patient evaluation  History of complicated migraines   Additional history obtained:  Additional history obtained from urgent  care External records from outside source obtained and reviewed including urgent care documentation   Lab Tests:  I Ordered, and personally interpreted labs.  The pertinent results include: WBC 12.2, unremarkable BMP, grossly unremarkable UA, negative pregnancy test   Imaging Studies ordered:  I ordered imaging studies including CT head without contrast, chest x-ray I independently visualized and interpreted imaging which showed unremarkable head CT. I agree with the radiologist interpretation   Cardiac Monitoring: / EKG:  The patient was maintained on a cardiac monitor.  I personally viewed and interpreted the cardiac monitored which showed an underlying rhythm of: Sinus rhythm   Problem List / ED Course / Critical interventions / Medication management   I ordered medication including Toradol and Compazine for headache  I have reviewed the patients home medicines and have made adjustments as needed   Social Determinants of Health:  Social Determinants of Health with Concerns   Tobacco Use: Medium Risk (01/31/2023)   Patient History    Smoking Tobacco Use: Former    Smokeless Tobacco Use: Never    Passive Exposure: Not on Actuary Strain: Not on file  Food Insecurity: Not on file  Transportation Needs: Not on file  Physical Activity: Not on file  Stress: Not on file  Social Connections: Not on file  Intimate Partner Violence: Not on file  Depression (ZOX0-9): Not on file  Alcohol Screen: Not on file  Housing: Not on file  Utilities: Not on file  Health Literacy: Not on file      Test / Admission - Considered:  Patient care being transferred to Pierz grandson, PA-C at shift handoff.  Patient administered headache medications with normal head CT.  Chest x-ray pending.  Disposition pending reassessment post headache medication.  Likely discharge home +/- antibiotics based on chest x-ray findings.  No dysrhythmia, no significant lab  abnormalities.         Final Clinical Impression(s) / ED Diagnoses Final diagnoses:  None    Rx / DC Orders ED Discharge Orders     None         Pamala Duffel 02/01/23 6045    Palumbo, April, MD 02/01/23 [phone]

## 2023-05-16 ENCOUNTER — Telehealth: Payer: Self-pay | Admitting: Neurology

## 2023-05-16 NOTE — Telephone Encounter (Signed)
Appointment details confirmed

## 2023-05-23 ENCOUNTER — Ambulatory Visit: Payer: Self-pay | Admitting: Neurology

## 2023-06-20 ENCOUNTER — Ambulatory Visit (INDEPENDENT_AMBULATORY_CARE_PROVIDER_SITE_OTHER): Payer: Self-pay | Admitting: Neurology

## 2023-06-20 ENCOUNTER — Encounter: Payer: Self-pay | Admitting: Neurology

## 2023-06-20 VITALS — BP 134/90 | HR 69 | Ht 68.0 in | Wt 222.0 lb

## 2023-06-20 DIAGNOSIS — G43009 Migraine without aura, not intractable, without status migrainosus: Secondary | ICD-10-CM

## 2023-06-20 DIAGNOSIS — R55 Syncope and collapse: Secondary | ICD-10-CM | POA: Diagnosis not present

## 2023-06-20 MED ORDER — PROPRANOLOL HCL ER 60 MG PO CP24: 60.0000 mg | ORAL_CAPSULE | Freq: Every day | ORAL | 3 refills | Status: AC

## 2023-06-20 NOTE — Progress Notes (Signed)
 [facility]  PATIENT: Veronica Obrien DOB: 08-Apr-1980  REQUESTING CLINICIAN: Milus Height, PA HISTORY FROM: Patient  REASON FOR VISIT: Migraines/Syncope    HISTORICAL  CHIEF COMPLAINT:  Chief Complaint  Patient presents with   Room 12    Pt is here Alone. Pt states she is getting 1-2 headaches per week. Pt states that she had passed out on 2023-02-19. Pt states that she had woke up like a normal day and she had passed out and woke up on her sofa. Pt states that her headaches will sometime last 1-2 days or 30 mins with pain behind her head or on the top of her head. Pt states that she will have pins and needles feeling in her eyes. Pt states that she will have light sensitivity with some of her headaches.    HISTORY OF PRESENT ILLNESS:  This is a 43 year old woman past medical history of anxiety/depression, prediabetes, obesity who is presenting with complaints of migraine headaches and syncopal episode.  In terms of her migraines, patient tells me that she suffers migraine all of her adult life.  She used to have 1-2 headaches per week and think this was normal until evaluated by her PCP who started her on ?triptan as abortive medication and referred her to neurology.  Her headaches are described as sharp pain behind eyes, sometimes she will have them on top of her eyes.  They would last hours and sometimes days.  They are associated with photophobia, phonophobia and nausea.  She has never been on a preventive medication, recently started on a ?triptan.  She reported her sister suffer from migraines.   Patient also reported back in October 2024 she had a syncopal episode.  She remembers waking up in the morning, making coffee and next and that she remembers is waking up on the couch in a weird position hours later.  She denies any injuries, denies any tongue biting, denies any urinary incontinence, denies any confusion.  She was concerned, and presented to the ED, her  workup including EKG and head CT was negative.  She was discharged home.  She denies any previous history of syncope and has not had any additional event since then.   Headache History and Characteristics: Onset: All her adult life  Location: top of head, sometimes behind eyes Quality:  Sharp pain behind eyes Intensity: 5-8/10.  Duration: couple hours to days  Migrainous Features: Photophobia, phonophobia, nausea.  Aura: No  History of brain injury or tumor: No  Family history: Sister Motion sickness: no Cardiac history: no  OTC: tylenol  Prior prophylaxis: Propranolol: No  Verapamil:No TCA: No Topamax: No Depakote: No Effexor: No Cymbalta: No Neurontin:No  Prior abortives: Triptan: Yes  Anti-emetic: No Steroids: No Ergotamine suppository: No   OTHER MEDICAL CONDITIONS: Anxiety/Depression, Prediabetes    REVIEW OF SYSTEMS: Full 14 system review of systems performed and negative with exception of: As noted in the HPI   ALLERGIES: Allergies  Allergen Reactions   Escitalopram     Other Reaction(s): Other (See Comments)  Other reaction(s): bad dreams  Other reaction(s): bad dreams  Other reaction(s): bad dreams  Other reaction(s): bad dreams, Other reaction(s): bad dreams   Escitalopram Oxalate     Other reaction(s): bad dreams   Novocain [Procaine Hcl] Other (See Comments)    Pt states that it makes her try to bite her tongue, mouth stayed numb for two days   Other     Other Reaction(s): Other (See Comments)  Other reaction(s): anger worse  Other reaction(s): anger worse   Procaine     Other Reaction(s): Other  Pt states that it makes her try to bite her tongue, mouth stayed numb for two days  Pt states that it makes her try to bite her tongue.   Venlafaxine Hcl Er     Other reaction(s): anger worse    HOME MEDICATIONS: Outpatient Medications Prior to Visit  Medication Sig Dispense Refill   triamcinolone cream (KENALOG) 0.1 % Apply 1 application  topically 2 (two) times daily. 30 g 2   triamcinolone lotion (KENALOG) 0.1 % Apply 1 Application topically 2 (two) times daily.     amoxicillin-clavulanate (AUGMENTIN) 875-125 MG tablet Take 1 tablet by mouth every 12 (twelve) hours. 14 tablet 0   bacitracin-neomycin-polymyxin-hydrocortisone (CORTISPORIN) 1 % ophthalmic ointment Place 1 Application into both eyes 2 (two) times daily. (Patient not taking: Reported on 06/20/2023)     meloxicam (MOBIC) 15 MG tablet Take 15 mg by mouth daily. (Patient not taking: Reported on 06/20/2023)     No facility-administered medications prior to visit.    PAST MEDICAL HISTORY: Past Medical History:  Diagnosis Date   Anxiety    Depression    Pre-diabetes    Right ovarian cyst 12/24/2014   5 cm right ovarian cyst    PAST SURGICAL HISTORY: Past Surgical History:  Procedure Laterality Date   LAPAROSCOPIC SALPINGO OOPHERECTOMY N/A 05/05/2020   Procedure: RIGHT LAPAROSCOPIC SALPINGO OOPHORECTOMY, LEFT LAPAROSCOPIC SALPINGECTOMY;  Surgeon: Reva Bores, MD;  Location: [facility];  Service: Gynecology;  Laterality: N/A;   SHOULDER ARTHROSCOPY WITH ROTATOR CUFF REPAIR AND SUBACROMIAL DECOMPRESSION Right 07/06/2022   Procedure: SHOULDER ARTHROSCOPY WITH ROTATOR CUFF REPAIR, SUBACROMIAL DECOMPRESSION, BICEPS TENODISIS, DISTAL CLAVICLE EXCISION, AND CORACOPLASTY;  Surgeon: Bjorn Pippin, MD;  Location: [facility];  Service: Orthopedics;  Laterality: Right;   WISDOM TOOTH EXTRACTION      FAMILY HISTORY: Family History  Problem Relation Age of Onset   Cancer Sister        lymphoma non-Hodgkins   Arthritis Father    Heart disease Maternal Grandfather    Diabetes Maternal Grandfather    Diabetes Paternal Grandmother    Breast cancer Paternal Grandmother        80   Diabetes Paternal Grandfather     SOCIAL HISTORY: Social History   Socioeconomic History   Marital status: Single    Spouse name: Not on file   Number of  children: Not on file   Years of education: Not on file   Highest education level: Not on file  Occupational History   Not on file  Tobacco Use   Smoking status: Former    Current packs/day: 0.00    Types: Cigarettes    Quit date: 06/06/2014    Years since quitting: 9.0   Smokeless tobacco: Never  Vaping Use   Vaping status: Never Used  Substance and Sexual Activity   Alcohol use: Not Currently   Drug use: Not Currently   Sexual activity: Yes  Other Topics Concern   Not on file  Social History Narrative   Not on file   Social Drivers of Health   Financial Resource Strain: Not on file  Food Insecurity: Not on file  Transportation Needs: Not on file  Physical Activity: Not on file  Stress: Not on file  Social Connections: Not on file  Intimate Partner Violence: Not on file    PHYSICAL EXAM  GENERAL EXAM/CONSTITUTIONAL: Vitals:  Vitals:   06/20/23 0920  BP: (!) 134/90  Pulse: 69  Weight: 222 lb (100.7 kg)  Height: 5\' 8"  (1.727 m)   Body mass index is 33.75 kg/m. Wt Readings from Last 3 Encounters:  06/20/23 222 lb (100.7 kg)  01/31/23 215 lb (97.5 kg)  07/06/22 227 lb 15.3 oz (103.4 kg)   Patient is in no distress; well developed, nourished and groomed; neck is supple  MUSCULOSKELETAL: Gait, strength, tone, movements noted in Neurologic exam below  NEUROLOGIC: MENTAL STATUS:      No data to display         awake, alert, oriented to person, place and time recent and remote memory intact normal attention and concentration language fluent, comprehension intact, naming intact fund of knowledge appropriate  CRANIAL NERVE:  2nd - no papilledema or hemorrhages on fundoscopic exam 2nd, 3rd, 4th, 6th - pupils equal and reactive to light, visual fields full to confrontation, extraocular muscles intact, no nystagmus 5th - facial sensation symmetric 7th - facial strength symmetric 8th - hearing intact 9th - palate elevates symmetrically, uvula midline 11th  - shoulder shrug symmetric 12th - tongue protrusion midline  MOTOR:  normal bulk and tone, full strength in the BUE, BLE  SENSORY:  normal and symmetric to light touch  COORDINATION:  finger-nose-finger, fine finger movements normal  GAIT/STATION:  normal   DIAGNOSTIC DATA (LABS, IMAGING, TESTING) - I reviewed patient records, labs, notes, testing and imaging myself where available.  Lab Results  Component Value Date   WBC 12.2 (H) 01/31/2023   HGB 14.6 01/31/2023   HCT 45.6 01/31/2023   MCV 86.7 01/31/2023   PLT 457 (H) 01/31/2023      Component Value Date/Time   NA 139 01/31/2023 1929   NA 136 08/15/2020 1627   K 3.8 01/31/2023 1929   CL 104 01/31/2023 1929   CO2 24 01/31/2023 1929   GLUCOSE 98 01/31/2023 1929   GLUCOSE 117 03/07/2015 0510   BUN 12 01/31/2023 1929   BUN 11 08/15/2020 1627   CREATININE 0.76 01/31/2023 1929   CALCIUM 9.2 01/31/2023 1929   PROT 7.4 10/28/2015 1012   ALBUMIN 4.3 10/28/2015 1012   AST 22 10/28/2015 1012   ALT 29 10/28/2015 1012   ALKPHOS 83 10/28/2015 1012   BILITOT 0.6 10/28/2015 1012   GFRNONAA >60 01/31/2023 1929   GFRAA >60 10/28/2015 1012   No results found for: "CHOL", "HDL", "LDLCALC", "LDLDIRECT", "TRIG", "CHOLHDL" No results found for: "HGBA1C" No results found for: "VITAMINB12" No results found for: "TSH"  Head CT 02/01/2023 Normal head CT.    ASSESSMENT AND PLAN  43 y.o. year old female with anxiety/depression, prediabetes and obesity who is presenting with complaint of migraines headaches and a syncopal episode.  For her headaches, we will start her on propranolol 60 mg nightly and patient can use her triptan as abortive medication.  I also advised her to consider Aleve as needed. In terms of her 1 syncopal episode, again no trauma, no confusion, no tongue biting, no urinary incontinence, suspicion for seizure is low.  Patient will continue to monitor these events and contact us if she does have another event.  I  will see her in 6 months for follow-up or sooner if worse.   1. Migraine without aura and without status migrainosus, not intractable   2. Syncope, unspecified syncope type     Patient Instructions  Start propranolol 60 mg daily Continue with migraine abortive medication, can also consider Aleve as needed  for migraine Continue to follow with PCP Return in 6 months or sooner if worse.   No orders of the defined types were placed in this encounter.   Meds ordered this encounter  Medications   propranolol ER (INDERAL LA) 60 MG 24 hr capsule    Sig: Take 1 capsule (60 mg total) by mouth daily.    Dispense:  90 capsule    Refill:  3    Return in about 6 months (around 12/21/2023).    Windell Norfolk, MD 06/20/2023, 10:25 AM  [facility] [address], [address] [place], [place] [address] [phone]

## 2023-06-20 NOTE — Patient Instructions (Signed)
 Start propranolol 60 mg daily Continue with migraine abortive medication, can also consider Aleve as needed for migraine Continue to follow with PCP Return in 6 months or sooner if worse.

## 2023-12-20 ENCOUNTER — Encounter: Payer: Self-pay | Admitting: Neurology

## 2023-12-20 ENCOUNTER — Ambulatory Visit: Admitting: Neurology
